# Patient Record
Sex: Male | Born: 1960 | Race: Black or African American | Hispanic: No | Marital: Married | Smoking: Never smoker
Health system: Southern US, Community
[De-identification: ages and names within clinical notes are randomized; demographics above are authoritative.]

## PROBLEM LIST (undated history)

## (undated) DIAGNOSIS — M2550 Pain in unspecified joint: Secondary | ICD-10-CM

## (undated) DIAGNOSIS — K219 Gastro-esophageal reflux disease without esophagitis: Secondary | ICD-10-CM

## (undated) DIAGNOSIS — I10 Essential (primary) hypertension: Secondary | ICD-10-CM

## (undated) DIAGNOSIS — D869 Sarcoidosis, unspecified: Secondary | ICD-10-CM

## (undated) DIAGNOSIS — E669 Obesity, unspecified: Secondary | ICD-10-CM

## (undated) DIAGNOSIS — Z86718 Personal history of other venous thrombosis and embolism: Secondary | ICD-10-CM

## (undated) DIAGNOSIS — M549 Dorsalgia, unspecified: Secondary | ICD-10-CM

## (undated) DIAGNOSIS — G4730 Sleep apnea, unspecified: Secondary | ICD-10-CM

## (undated) DIAGNOSIS — R0602 Shortness of breath: Secondary | ICD-10-CM

## (undated) DIAGNOSIS — G4700 Insomnia, unspecified: Secondary | ICD-10-CM

## (undated) DIAGNOSIS — R5383 Other fatigue: Secondary | ICD-10-CM

## (undated) DIAGNOSIS — I4891 Unspecified atrial fibrillation: Secondary | ICD-10-CM

## (undated) DIAGNOSIS — M1990 Unspecified osteoarthritis, unspecified site: Secondary | ICD-10-CM

## (undated) DIAGNOSIS — M069 Rheumatoid arthritis, unspecified: Secondary | ICD-10-CM

## (undated) DIAGNOSIS — I499 Cardiac arrhythmia, unspecified: Secondary | ICD-10-CM

## (undated) HISTORY — PX: OTHER SURGICAL HISTORY: SHX169

## (undated) HISTORY — DX: Sarcoidosis, unspecified: D86.9

## (undated) HISTORY — DX: Rheumatoid arthritis, unspecified: M06.9

## (undated) HISTORY — DX: Personal history of other venous thrombosis and embolism: Z86.718

## (undated) HISTORY — DX: Cardiac arrhythmia, unspecified: I49.9

## (undated) HISTORY — DX: Pain in unspecified joint: M25.50

## (undated) HISTORY — DX: Unspecified atrial fibrillation: I48.91

## (undated) HISTORY — DX: Shortness of breath: R06.02

## (undated) HISTORY — DX: Obesity, unspecified: E66.9

## (undated) HISTORY — DX: Other fatigue: R53.83

## (undated) HISTORY — PX: COLONOSCOPY: SHX174

## (undated) HISTORY — DX: Essential (primary) hypertension: I10

## (undated) HISTORY — DX: Dorsalgia, unspecified: M54.9

## (undated) HISTORY — PX: VASECTOMY: SHX75

---

## 1979-04-23 HISTORY — PX: WRIST SURGERY: SHX841

## 2003-03-25 ENCOUNTER — Ambulatory Visit (HOSPITAL_COMMUNITY): Admission: RE | Admit: 2003-03-25 | Discharge: 2003-03-25 | Payer: Self-pay | Admitting: Family Medicine

## 2003-04-08 ENCOUNTER — Ambulatory Visit (HOSPITAL_COMMUNITY): Admission: RE | Admit: 2003-04-08 | Discharge: 2003-04-08 | Payer: Self-pay | Admitting: Family Medicine

## 2003-04-29 ENCOUNTER — Ambulatory Visit (HOSPITAL_COMMUNITY): Admission: RE | Admit: 2003-04-29 | Discharge: 2003-04-29 | Payer: Self-pay | Admitting: Internal Medicine

## 2003-08-24 ENCOUNTER — Ambulatory Visit (HOSPITAL_COMMUNITY): Admission: RE | Admit: 2003-08-24 | Discharge: 2003-08-24 | Payer: Self-pay | Admitting: Internal Medicine

## 2003-08-24 ENCOUNTER — Encounter (INDEPENDENT_AMBULATORY_CARE_PROVIDER_SITE_OTHER): Payer: Self-pay | Admitting: *Deleted

## 2004-05-25 ENCOUNTER — Ambulatory Visit: Payer: Self-pay | Admitting: Internal Medicine

## 2005-08-26 ENCOUNTER — Ambulatory Visit: Payer: Self-pay | Admitting: Internal Medicine

## 2007-01-12 ENCOUNTER — Ambulatory Visit: Payer: Self-pay | Admitting: Internal Medicine

## 2007-01-12 LAB — CONVERTED CEMR LAB
ALT: 20 units/L (ref 0–53)
AST: 23 units/L (ref 0–37)
Albumin: 3.6 g/dL (ref 3.5–5.2)
Alkaline Phosphatase: 42 units/L (ref 39–117)
Angiotensin 1 Converting Enzyme: 87 units/L — ABNORMAL HIGH (ref 9–67)
BUN: 14 mg/dL (ref 6–23)
Basophils Absolute: 0 10*3/uL (ref 0.0–0.1)
Basophils Relative: 0.3 % (ref 0.0–1.0)
Bilirubin, Direct: 0.2 mg/dL (ref 0.0–0.3)
CO2: 35 meq/L — ABNORMAL HIGH (ref 19–32)
Calcium: 9.5 mg/dL (ref 8.4–10.5)
Chloride: 111 meq/L (ref 96–112)
Creatinine, Ser: 1.3 mg/dL (ref 0.4–1.5)
Eosinophils Absolute: 0.1 10*3/uL (ref 0.0–0.6)
Eosinophils Relative: 3 % (ref 0.0–5.0)
GFR calc Af Amer: 76 mL/min
GFR calc non Af Amer: 63 mL/min
Glucose, Bld: 103 mg/dL — ABNORMAL HIGH (ref 70–99)
HCT: 45.2 % (ref 39.0–52.0)
Hemoglobin: 14.8 g/dL (ref 13.0–17.0)
Lymphocytes Relative: 26.1 % (ref 12.0–46.0)
MCHC: 32.6 g/dL (ref 30.0–36.0)
MCV: 89.4 fL (ref 78.0–100.0)
Monocytes Absolute: 0.4 10*3/uL (ref 0.2–0.7)
Monocytes Relative: 9.7 % (ref 3.0–11.0)
Neutro Abs: 2.6 10*3/uL (ref 1.4–7.7)
Neutrophils Relative %: 60.9 % (ref 43.0–77.0)
Platelets: 174 10*3/uL (ref 150–400)
Potassium: 4.3 meq/L (ref 3.5–5.1)
RBC: 5.05 M/uL (ref 4.22–5.81)
RDW: 13.1 % (ref 11.5–14.6)
Sodium: 148 meq/L — ABNORMAL HIGH (ref 135–145)
Total Bilirubin: 1.3 mg/dL — ABNORMAL HIGH (ref 0.3–1.2)
Total Protein: 7.4 g/dL (ref 6.0–8.3)
WBC: 4.2 10*3/uL — ABNORMAL LOW (ref 4.5–10.5)

## 2008-01-07 DIAGNOSIS — D869 Sarcoidosis, unspecified: Secondary | ICD-10-CM

## 2008-03-28 ENCOUNTER — Ambulatory Visit: Payer: Self-pay | Admitting: Internal Medicine

## 2008-03-28 DIAGNOSIS — G4700 Insomnia, unspecified: Secondary | ICD-10-CM | POA: Insufficient documentation

## 2008-05-06 ENCOUNTER — Telehealth: Payer: Self-pay | Admitting: Internal Medicine

## 2009-06-20 ENCOUNTER — Ambulatory Visit: Payer: Self-pay | Admitting: Internal Medicine

## 2010-02-21 ENCOUNTER — Telehealth: Payer: Self-pay | Admitting: Internal Medicine

## 2010-05-12 ENCOUNTER — Encounter: Payer: Self-pay | Admitting: Family Medicine

## 2010-05-24 NOTE — Assessment & Plan Note (Signed)
Summary: 50yr follow up/klw   Primary Provider/Referring Provider:  Laurann Montana  CC:  follow up visit.  History of Present Illness: 01/16/07- PROBLEM:  Sarcoidosis.  HISTORY:  A 2-year followup.  He says he feels fine, but thought it was time to be rechecked.  Denies cough, rash, or night sweats.  Points to a little minor scaling on his hands of no known significance.  Also has some minor acneform rash on his chest.   03/28/08- Hx Sarcoid.  He asks about sleep. Dr Cliffton Asters told him his symptoms are not those of sleep apnea. Wife tells him he doesn't snore or display apnea. Trazodone helps fall asleep intially- up til 0200 watching tv until tired enough to sleep.  Went to derm for lotion which helped hyperpigmented lesions on skin. Denies fever, nodes, cough, chest pain. Discussed ACE level 87 last year.  June 20, 2009- Sarcoid, insomnia Has noted some rash still - meaning a few hyperpigmented spots on his trunk with no progression. He denies change in breathing, unusual dyspnea or cough, fever or night sweat, or nodes. Insomnia- Trazadone not quite enough but he likes an otc product for occasional use. We reviewed his last 2 CXR with stable Stage II adenopathy.    Current Medications (verified): 1)  Triamterene-Hctz 75-50 Mg Tabs (Triamterene-Hctz) .... Take One By Mouth Once Daily 2)  Trazodone Hcl 50 Mg Tabs (Trazodone Hcl) .... Take One By Mouth At Bedtime As Needed 3)  Fexofenadine Hcl 180 Mg Tabs (Fexofenadine Hcl) .... Take One By Mouth Once Daily 4)  Ginkoba 40 Mg Tabs (Ginkgo Biloba) .... Take 1 By Mouth Once Daily 5)  Vitamin E Natural 400 Unit Caps (Vitamin E) .... Take 2 By Mouth Once Daily 6)  Fish Oil 1000 Mg Caps (Omega-3 Fatty Acids) .... Take 1 By Mouth Once Daily  Allergies (verified): No Known Drug Allergies  Past History:  Past Medical History: Last updated: 03/28/2008 SARCOIDOSIS (ICD-135)  Family History: Last updated: 05/11/2008 Mother-living age  79 Father-deceased age 64; rectal cancer Sibling 1-living age 62 Sibling 2-living age 37 Sibling 3-living age 43 Sibling 4-living age 44  Social History: Last updated: 05/11/2008 Patient never smoked.  Positive history of passive tobacco smoke exposure.  Caffeine-1 cup daily Married with 1 child.   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  Review of Systems      See HPI       The patient complains of weight gain.  The patient denies anorexia, fever, weight loss, vision loss, decreased hearing, hoarseness, chest pain, syncope, dyspnea on exertion, peripheral edema, prolonged cough, headaches, hemoptysis, abdominal pain, and severe indigestion/heartburn.    Vital Signs:  Patient profile:   50 year old male Height:      75 inches Weight:      400.25 pounds BMI:     50.21 O2 Sat:      96 % on Room air Pulse rate:   89 / minute BP sitting:   140 / 90  (left arm) Cuff size:   regular  Vitals Entered By: Reynaldo Minium CMA (June 20, 2009 2:33 PM)  O2 Flow:  Room air CC: follow up visit Comments BP taken with regular cuff on wrist of pt.Reynaldo Minium CMA  June 20, 2009 2:34 PM    Physical Exam  Additional Exam:  General: A/Ox3; pleasant and cooperative, NAD, very big man SKIN:  hyperpigmented spots on chest c/w acne scars NODES: no lymphadenopathy HEENT: [place]/AT, EOM- WNL, Conjuctivae- clear, PERRLA, TM-WNL, Nose- clear, Throat- clear and wnl NECK: Supple w/ fair ROM, JVD- none, normal carotid impulses w/o bruits Thyroid- normal to palpation CHEST: Clear to P&A HEART: RRR, no m/g/r heard ABDOMEN: Soft and nl; nml bowel sounds; no organomegaly or masses noted WUJ:WJXB, nl pulses, no edema  NEURO: Grossly intact to observation      Impression & Recommendations:  Problem # 1:  SARCOIDOSIS (ICD-135) He is clinically stable and we  decided there is no need to recheck CXR or ACE level without active symptoms at this time.,  Problem # 2:  INSOMNIA (ICD-780.52)  OTC med is sufficient. It is likely an antihistamine.  Medications Added to Medication List This Visit: 1)  Fish Oil 1000 Mg Caps (Omega-3 fatty acids) .... Take 1 by mouth once daily  Other Orders: Est. Patient Level III (14782)  Patient Instructions: 1)  Schedule return in one year, earlier if needed. Please call as needed.

## 2010-05-24 NOTE — Progress Notes (Signed)
Summary: nos appt  Phone Note Call from Patient   Caller: [email or web address]  Call For: young Summary of Call: LMTCB x2 to rsc nos from 11/1. Initial call taken by: Darletta Moll,  February 21, 2010 3:10 PM

## 2010-06-15 ENCOUNTER — Other Ambulatory Visit: Payer: Self-pay | Admitting: Internal Medicine

## 2010-06-15 ENCOUNTER — Encounter: Payer: Self-pay | Admitting: Internal Medicine

## 2010-06-15 ENCOUNTER — Ambulatory Visit (INDEPENDENT_AMBULATORY_CARE_PROVIDER_SITE_OTHER)
Admission: RE | Admit: 2010-06-15 | Discharge: 2010-06-15 | Disposition: A | Discharge status: Home / Self Care | Payer: BC Managed Care – PPO | Source: Ambulatory Visit | Attending: Internal Medicine | Admitting: Internal Medicine

## 2010-06-15 ENCOUNTER — Ambulatory Visit (INDEPENDENT_AMBULATORY_CARE_PROVIDER_SITE_OTHER): Payer: BC Managed Care – PPO | Admitting: Internal Medicine

## 2010-06-15 DIAGNOSIS — J309 Allergic rhinitis, unspecified: Secondary | ICD-10-CM | POA: Insufficient documentation

## 2010-06-15 DIAGNOSIS — D869 Sarcoidosis, unspecified: Secondary | ICD-10-CM

## 2010-06-15 DIAGNOSIS — I10 Essential (primary) hypertension: Secondary | ICD-10-CM | POA: Insufficient documentation

## 2010-06-28 NOTE — Assessment & Plan Note (Signed)
Summary: 1 YEAR ROV//SH   Primary Provider/Referring Provider:  Laurann Montana  CC:  Yearly follow up visit-Insomnia and sarcoidosis.  History of Present Illness: 03/28/08- Hx Sarcoid.  He asks about sleep. Dr Cliffton Asters told him his symptoms are not those of sleep apnea. Wife tells him he doesn't snore or display apnea. Trazodone helps fall asleep intially- up til 0200 watching tv until tired enough to sleep.  Went to derm for lotion which helped hyperpigmented lesions on skin. Denies fever, nodes, cough, chest pain. Discussed ACE level 87 last year.  June 20, 2009- Sarcoid, insomnia Has noted some rash still - meaning a few hyperpigmented spots on his trunk with no progression. He denies change in breathing, unusual dyspnea or cough, fever or night sweat, or nodes. Insomnia- Trazadone not quite enough but he likes an otc product for occasional use. We reviewed his last 2 CXR with stable Stage II adenopathy.   June 15, 2010-  Sarcoid, insomnia Nurse-CC: Yearly follow up visit-Insomnia and sarcoidosis CXR 2009 showed mild CR and chronic hilar enlargement.  Concerned about skin rash-? sarcoid. Chest ok. Gets shoulder pains affected by position he lies in- not exertional. Denies anginal pain, palpitation, cough, wheeze, nodes, night sweats or fevers.     Preventive Screening-Counseling & Management  Alcohol-Tobacco     Smoking Status: never     Passive Smoke Exposure: yes  Current Medications (verified): 1)  Triamterene-Hctz 75-50 Mg Tabs (Triamterene-Hctz) .... Take 1/2 By Mouth Once Daily 2)  Lunesta 3 Mg Tabs (Eszopiclone) .... Take 1 By Mouth At Bedtime As Needed 3)  Fexofenadine Hcl 180 Mg Tabs (Fexofenadine Hcl) .... Take One By Mouth Once Daily 4)  Ginkoba 40 Mg Tabs (Ginkgo Biloba) .... Take 1 By Mouth Once Daily 5)  Vitamin E Natural 400 Unit Caps (Vitamin E) .... Take 2 By Mouth Once Daily 6)  Fish Oil 1000 Mg Caps (Omega-3 Fatty Acids) .... Take 1 By Mouth Once  Daily 7)  Norvasc 10 Mg Tabs (Amlodipine Besylate) .... 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  Family History: Last updated: 05/11/2008 Mother-living age 15 Father-deceased age 87; rectal cancer Sibling 1-living age 88 Sibling 2-living age 40 Sibling 3-living age 69 Sibling 4-living age 36  Social History: Last updated: 05/11/2008 Patient never smoked.  Positive history of passive tobacco smoke exposure.  Caffeine-1 cup daily Married with 1 child.   Risk Factors: Smoking Status: never (06/15/2010) Passive Smoke Exposure: yes (06/15/2010)  Past Medical History: SARCOIDOSIS (ICD-135) Allergic Rhinitis Hypertension  Review of Systems      See HPI       The patient complains of shortness of breath with activity.  The patient denies shortness of breath at rest, productive cough, non-productive cough, coughing up blood, chest pain, irregular heartbeats, acid heartburn, indigestion, loss of appetite, weight change, abdominal pain, difficulty swallowing, sore throat, tooth/dental problems, headaches, nasal congestion/difficulty breathing through nose, and sneezing.    Vital Signs:  Patient profile:   49 year old male Height:      75 inches Weight:      403.13 pounds BMI:     50.57 O2 Sat:      97 % on Room air Pulse rate:   91 / minute BP sitting:   120 / 80  (left arm) Cuff size:   large  Vitals Entered By:  Reynaldo Minium CMA (June 15, 2010 1:40 PM)  O2 Flow:  Room air CC: Yearly follow up visit-Insomnia and sarcoidosis   Physical Exam  Additional Exam:  General: A/Ox3; pleasant and cooperative, NAD, very big man, obese SKIN: hyperpigmented spots on chest c/w acne scars. Dark raised multinodular patch or left pretibial area.  NODES: no lymphadenopathy HEENT: [place]/AT, EOM- WNL, Conjuctivae- clear,  PERRLA, TM-WNL, Nose- clear, Throat- clear and wnl NECK: Supple w/ fair ROM, JVD- none, normal carotid impulses w/o bruits Thyroid- normal to palpation CHEST: Clear to P&A HEART: RRR, no m/g/r heard ABDOMEN: obese BJY:NWGN, nl pulses, no edema  NEURO: Grossly intact to observation      Impression & Recommendations:  Problem # 1:  SARCOIDOSIS (ICD-135) I don't see indication that systemic sarcoid has returned.  skin rash on trunk looks like foliculitis. The area on his left leg is something else and I will send him to dermatologist. He asks referral suggestions, but wants to discuss with his wife.   Medications Added to Medication List This Visit: 1)  Triamterene-hctz 75-50 Mg Tabs (Triamterene-hctz) .... Take 1/2 by mouth once daily 2)  Lunesta 3 Mg Tabs (Eszopiclone) .... Take 1 by mouth at bedtime as needed 3)  Norvasc 10 Mg Tabs (Amlodipine besylate) .... Take 1 by mouth once daily  Other Orders: Est. Patient Level III (56213) T-2 View CXR (71020TC)  Patient Instructions: 1)  Please schedule a follow-up appointment in 1 year. 2)  A chest x-ray has been recommended.  Your imaging study may require preauthorization.  3)  I suggest [facility] on [address]  4)  (Dr Romeo [facility])

## 2010-09-04 NOTE — Assessment & Plan Note (Signed)
[facility]                             PULMONARY OFFICE NOTE   Brent Mccoy, Brent Mccoy                    MRN:          [id number]  DATE:01/12/2007                            DOB:          11-Feb-1961    PULMONARY FOLLOWUP   PROBLEM:  Sarcoidosis.   HISTORY:  A 2-year followup.  He says he feels fine, but thought it was  time to be rechecked.  Denies cough, rash, or night sweats.  Points to a  little minor scaling on his hands of no known significance.  Also has  some minor acneform rash on his chest.   MEDICATIONS:  1. Triamterine/hydrochlorothiazide 75/50.  2. P.r.n. use of Astelin.  3. Temazepam.  4. Trazodone 50 mg p.r.n. for sleep.  5. Fexofenadine 180 mg.   No medication allergy.   OBJECTIVE:  Weight 386 pounds compared with 358 pounds in 2007.  He is  tall and very heavy.  BP 132/90, pulse 92, room air saturation 98%.  Good spirits.  Conjunctivae clear.  Nasal airway not obstructed.  Lungs sound very clear.  HEART:  Sounds are regular without murmur.  I do not find adenopathy.  Abdomen is obese.  I cannot feel liver or  spleen.   LABORATORY:  Angiotensin converting enzyme elevated at 87 (9-67).  Chest  x-ray suggests mild hilar prominence with no parenchymal abnormality.  Basically a normal film.  CBC showed a white blood cell count of 4,200.  Hemoglobin 14.8.  Chemistries showed a sodium 148, resting carbon  dioxide of 35, glucose 103, BUN 14, creatinine 1.3, calcium 9.5.  Liver  enzymes normal.   IMPRESSION:  Clinically stable sarcoidosis.  Substantial exogenous  obesity.   PLAN:  Labs were ordered and returned in time for dictation, as noted  above.  Results were shared.  Schedule return in 1 year, earlier p.r.n.     Clinton D. Maple Hudson, MD, Tonny Bollman, FACP  Electronically Signed   CDY/MedQ  DD: 01/16/2007  DT: 01/17/2007  Job #: [id number]   cc:   Stacie Acres. Cliffton Asters, M.D.

## 2010-09-07 NOTE — Op Note (Signed)
NAME:  Brent Mccoy, Brent Mccoy                       ACCOUNT NO.:  [id number]   MEDICAL RECORD NO.:  [id number]                   PATIENT TYPE:  AMB   LOCATION:  ENDO                                 FACILITY:  [facility]   PHYSICIAN:  Clinton D. Maple Hudson, M.D.              DATE OF BIRTH:  04-Dec-1960   DATE OF PROCEDURE:  08/24/2003  DATE OF DISCHARGE:                                 OPERATIVE REPORT   PROCEDURE PERFORMED:  Bronchoscopy.   ENDOSCOPIST:  Rennis Chris. Maple Hudson, M.D.   INDICATIONS FOR PROCEDURE:  The patient is a 50 year old African-American  male nonsmoker with infiltrate particularly in the left upper and mid lung  zone, mediastinal and hilar adenopathy, negative PPD, elevation angiotensin  converting enzyme and restricted pulmonary function tests.  Bronchoscopy is  performed for tissue confirmation of suspected sarcoid.   PREOPERATIVE EVALUATION:  Blood pressure 136/89, weight 350 pounds.  Heart  sounds regular without murmur or gallop.  No peripheral adenopathy, no  cyanosis, clubbing, edema or neck vein distention, quiet, clear breath  sounds, no crackles or wheeze heard.   ALLERGIES:  No medication allergies.   CURRENT MEDICATIONS:  Claritin, Rhinocort, Triamterene/HCTZ, prednisone  begun at 40 mg daily on April 15 and now at 30 mg daily.   DESCRIPTION OF PROCEDURE:  After fully informed consent, bronchoscopy was  performed on an outpatient basis in the endoscopy suite.  Premedication was  with Demerol and atropine.  The upper airway was anesthetized topically with  Cetacaine and Xylocaine.  Oxygen was provided at 10L per minute by nasal  prongs holding saturations at 100%.  Cardiac monitor showed normal sinus  rhythm.  He was given a cumulative dose of 10 mg of intravenous Versed and  25 mg of intravenous Demerol to produce very light sedation for cough  suppression.  A video bronchoscope was introduced via the right nostril and  advanced to the level of the vocal cords  without difficulty.  Cough was  moderate, the cords  moved normally.  The trachea and main carina were  unremarkable.  The carina may have been very slightly wide. There was a mild  generalized bronchitis.  Secretions were clear and white.  With fluoroscopic  guidance, the bronchoscope was directed to the left upper lobe.  The left  upper lobe and lingula were lavaged with saline, brushed for cytology and  AFB smear and then biopsied by standard transbronchial lung biopsy  technique.  There was no apparent complication with no evidence of  pneumothorax or bleeding during the procedure.  He appeared stable and was  lucid and conversant immediately upon ending the procedure.   FINAL IMPRESSION:  Probable sarcoid.  He will be held until stable and then  released home with family to office follow-up.  Clinton D. Maple Hudson, M.D.    CDY/MEDQ  D:  08/24/2003  T:  08/24/2003  Job:  [id number]   cc:   Stacie Acres. White, M.D.  [address]  [place]  [place] [address]  Fax: [phone]

## 2011-03-13 ENCOUNTER — Encounter: Payer: Self-pay | Admitting: Internal Medicine

## 2011-03-15 ENCOUNTER — Ambulatory Visit: Payer: BC Managed Care – PPO | Admitting: Internal Medicine

## 2011-04-11 ENCOUNTER — Encounter: Payer: Self-pay | Admitting: Internal Medicine

## 2011-04-11 ENCOUNTER — Ambulatory Visit (INDEPENDENT_AMBULATORY_CARE_PROVIDER_SITE_OTHER): Payer: 59 | Admitting: Internal Medicine

## 2011-04-11 VITALS — BP 130/72 | HR 99 | Ht 75.0 in | Wt 398.4 lb

## 2011-04-11 DIAGNOSIS — D869 Sarcoidosis, unspecified: Secondary | ICD-10-CM

## 2011-04-11 DIAGNOSIS — G4700 Insomnia, unspecified: Secondary | ICD-10-CM

## 2011-04-11 NOTE — Patient Instructions (Signed)
Please call as needed 

## 2011-04-11 NOTE — Progress Notes (Signed)
04/11/11- 50 yom never smoker followed for hx sarcoid, insomnia complicated by HBP, allergic rhinitis. LOV-06/15/10  Feels fine. Declines flu vax. Saw dermatology for rash "not sarcoid". Denies chest symptoms.  Insomnia- failed [place]. Prefers trazodone. CXR 06/15/10- stable hilar fullness, shallow inspiration. Images reviewed w/ him.  ROV-see HPI Constitutional:   No-   weight loss, night sweats, fevers, chills, fatigue, lassitude. HEENT:   No-  headaches, difficulty swallowing, tooth/dental problems, sore throat,       No-  sneezing, itching, ear ache, nasal congestion, post nasal drip,  CV:  No-   chest pain, orthopnea, PND, swelling in lower extremities, anasarca, dizziness, palpitations Resp: No-   shortness of breath with exertion or at rest.              No-   productive cough,  No non-productive cough,  No- coughing up of blood.              No-   change in color of mucus.  No- wheezing.   Skin: No-   rash or lesions. GI:  No-   heartburn, indigestion, abdominal pain, nausea, vomiting, diarrhea,                 change in bowel habits, loss of appetite GU: No-   dysuria, change in color of urine, no urgency or frequency.  No- flank pain. MS:  No-   joint pain or swelling.  No- decreased range of motion.  No- back pain. Neuro-     nothing unusual Psych:  No- change in mood or affect. No depression or anxiety.  No memory loss.  OBJ General- Alert, Oriented, Affect-appropriate, Distress- none acute, morbidly obese, laconic Skin- rash-hyperpigmented area persists left lateral calf. , lesions- none, excoriation- none Lymphadenopathy- none Head- atraumatic            Eyes- Gross vision intact, PERRLA, conjunctivae clear secretions            Ears- Hearing, canals-normal            Nose- Clear, no-Septal dev, mucus, polyps, erosion, perforation             Throat- Mallampati II , mucosa clear , drainage- none, tonsils- atrophic Neck- flexible , trachea midline, no stridor ,  thyroid nl, carotid no bruit Chest - symmetrical excursion , unlabored           Heart/CV- RRR , no murmur , no gallop  , no rub, nl s1 s2                           - JVD- none , edema- none, stasis changes- none, varices- none           Lung- clear to P&A, wheeze- none, cough- none , dullness-none, rub- none           Chest wall-  Abd- tender-no, distended-no, bowel sounds-present, HSM- no Br/ Gen/ Rectal- Not done, not indicated Extrem- cyanosis- none, clubbing, none, atrophy- none, strength- nl Neuro- grossly intact to observation

## 2011-04-14 NOTE — Assessment & Plan Note (Signed)
Chronic non-specific insomnia. He asks and is given permission to try Zzquil. Sleep hygiene reviewed.

## 2011-04-14 NOTE — Assessment & Plan Note (Signed)
Sarcoid is not clinically active and may be burned out. I suggested he f/u with his PCP as needed. I will be happy to see him again prn.

## 2013-04-30 ENCOUNTER — Ambulatory Visit
Admission: RE | Admit: 2013-04-30 | Discharge: 2013-04-30 | Disposition: A | Discharge status: Home / Self Care | Payer: 59 | Source: Ambulatory Visit | Attending: Family Medicine | Admitting: Family Medicine

## 2013-04-30 ENCOUNTER — Other Ambulatory Visit: Payer: Self-pay | Admitting: Family Medicine

## 2013-04-30 DIAGNOSIS — M25552 Pain in left hip: Secondary | ICD-10-CM

## 2013-06-19 ENCOUNTER — Encounter: Payer: Self-pay | Admitting: *Deleted

## 2014-02-06 ENCOUNTER — Ambulatory Visit (INDEPENDENT_AMBULATORY_CARE_PROVIDER_SITE_OTHER): Payer: 59 | Admitting: Physician Assistant

## 2014-02-06 VITALS — BP 130/80 | HR 109 | Temp 98.8°F | Resp 16 | Wt >= 6400 oz

## 2014-02-06 DIAGNOSIS — M25569 Pain in unspecified knee: Secondary | ICD-10-CM

## 2014-02-06 DIAGNOSIS — M25559 Pain in unspecified hip: Secondary | ICD-10-CM

## 2014-02-06 DIAGNOSIS — J019 Acute sinusitis, unspecified: Secondary | ICD-10-CM

## 2014-02-06 DIAGNOSIS — R059 Cough, unspecified: Secondary | ICD-10-CM

## 2014-02-06 DIAGNOSIS — R05 Cough: Secondary | ICD-10-CM

## 2014-02-06 MED ORDER — IPRATROPIUM BROMIDE 0.03 % NA SOLN: 2.0000 | Freq: Two times a day (BID) | NASAL | Status: DC

## 2014-02-06 MED ORDER — PROMETHAZINE-CODEINE 6.25-10 MG/5ML PO SYRP: 5.0000 mL | ORAL_SOLUTION | Freq: Four times a day (QID) | ORAL | Status: DC | PRN

## 2014-02-06 MED ORDER — MELOXICAM 15 MG PO TABS: 15.0000 mg | ORAL_TABLET | Freq: Every day | ORAL | Status: DC

## 2014-02-06 NOTE — Progress Notes (Signed)
Subjective:    Patient ID: Brent Mccoy, male    DOB: Dec 01, 1960, 53 y.o.   MRN: [id number]  HPI  Brent Mccoy is a 53 y.o. male with a pmh of Sarcoidosis currently in remission presenting for 2 day history very "violent cough". Cough started when he was out Friday night, is worse at night, productive with clear sputum, no hemoptysis. Associated symptoms include chills, sinus pressure, congestion, throat has felt raw from coughing in the following days. Patient had promethazine-codeine syrup left over from a previous visit in 2013 for similar complaint, states that it helped calm his cough, throat pain last night. He also tried going to the sauna at his gym yesterday which helped clear things up in his sinuses but his cough has persisted. Also states that he has tried Mucinex in the past without relief. Denies fevers, headaches, tooth pain, eye pain, ear pain, sob, chest pain, chest tightness, n/v, diarrhea, abdominal pain. He is worried that he has an infection but would also like to know if this is a cold or just allergies. Denies any other aggravating or relieving factors.  Of note, patient sees an orthopedist for knee and hip pain. Reports that he is in the process of managing his pain until he can get a knee replacement. Currently, takes Percocet for pain. States that since he used the cough syrup, he is not using the Percocet. In the meantime, he would like to know what he can do for his knee/hip pain. Denies other questions or concerns.   Prior to Admission medications   Medication Sig Start Date End Date Taking? Authorizing Provider  amLODipine (NORVASC) 10 MG tablet Take 10 mg by mouth daily.     Yes Historical Provider, MD  Ginkgo Biloba (GINKOBA) 40 MG TABS Take 1 tablet by mouth daily.     Yes Historical Provider, MD  MELOXICAM PO Take by mouth.   Yes Historical Provider, MD  Omega-3 Fatty Acids (FISH OIL) 1000 MG CAPS Take 1 capsule by mouth daily.     Yes Historical Provider, MD    triamterene-hydrochlorothiazide (MAXZIDE) 75-50 MG per tablet Take 1 tablet by mouth daily.     Yes Historical Provider, MD  vitamin E (VITAMIN E) 400 UNIT capsule Take 400 Units by mouth daily.     Yes Historical Provider, MD  zolpidem (AMBIEN) 10 MG tablet Take 10 mg by mouth at bedtime as needed for sleep.   Yes Historical Provider, MD  cyclobenzaprine (FLEXERIL) 10 MG tablet Take 10 mg by mouth 3 (three) times daily as needed for muscle spasms.    Historical Provider, MD  Eszopiclone 3 MG TABS Take 3 mg by mouth at bedtime. Take immediately before bedtime     Historical Provider, MD    No Known Allergies   Review of Systems As in subjective.    Objective:   Physical Exam  Vitals reviewed. Constitutional: He appears well-developed and well-nourished. No distress.  HENT:  Right Ear: External ear normal.  Left Ear: External ear normal.  Eyes: Conjunctivae are normal. Pupils are equal, round, and reactive to light. Right eye exhibits no discharge. Left eye exhibits no discharge. No scleral icterus.  Neck: Normal range of motion. Neck supple. No thyromegaly present.  Cardiovascular: Normal rate, regular rhythm, normal heart sounds and intact distal pulses.  Exam reveals no gallop and no friction rub.   No murmur heard. Pulmonary/Chest: Effort normal and breath sounds normal. No stridor. No respiratory distress. He has no wheezes. He  has no rales.  Lymphadenopathy:    He has no cervical adenopathy.  Skin: He is not diaphoretic.      Assessment & Plan:    1. Acute rhinosinusitis 2. Cough Likely viral in etiology, supportive care advised, return to clinic if symptoms worsen, fail to resolve or as needed - ipratropium (ATROVENT) 0.03 % nasal spray; Place 2 sprays into both nostrils 2 (two) times daily.  Dispense: 30 mL; Refill: 0 - promethazine-codeine (PHENERGAN WITH CODEINE) 6.25-10 MG/5ML syrup; Take 5 mLs by mouth every 6 (six) hours as needed for cough.  Dispense: 120 mL;  Refill: 0  3. Hip pain, unspecified laterality 4. Knee pain, unspecified laterality Advised patient not to take codeine syrup concurrently with syrup, patient verbalized agreement, Rx Mobic for hip and knee pain, continue follow up with Ortho - meloxicam (MOBIC) 15 MG tablet; Take 1 tablet (15 mg total) by mouth daily.  Dispense: 30 tablet; Refill: [place], PA-C [facility] [phone] 02/06/2014 1:26 PM

## 2014-02-07 NOTE — Progress Notes (Signed)
I have discussed this patient with Mr. Bess Harvest, [place] and agree.

## 2015-01-11 ENCOUNTER — Other Ambulatory Visit: Payer: Self-pay | Admitting: Orthopedic Surgery

## 2015-02-09 ENCOUNTER — Ambulatory Visit (HOSPITAL_COMMUNITY)
Admission: RE | Admit: 2015-02-09 | Discharge: 2015-02-09 | Disposition: A | Discharge status: Home / Self Care | Payer: 59 | Source: Ambulatory Visit | Attending: Orthopedic Surgery | Admitting: Orthopedic Surgery

## 2015-02-09 ENCOUNTER — Encounter (HOSPITAL_COMMUNITY): Payer: Self-pay

## 2015-02-09 ENCOUNTER — Encounter (HOSPITAL_COMMUNITY)
Admission: RE | Admit: 2015-02-09 | Discharge: 2015-02-09 | Disposition: A | Discharge status: Home / Self Care | Payer: 59 | Source: Ambulatory Visit | Attending: Orthopedic Surgery | Admitting: Orthopedic Surgery

## 2015-02-09 DIAGNOSIS — Z0183 Encounter for blood typing: Secondary | ICD-10-CM | POA: Diagnosis not present

## 2015-02-09 DIAGNOSIS — M1612 Unilateral primary osteoarthritis, left hip: Secondary | ICD-10-CM | POA: Diagnosis not present

## 2015-02-09 DIAGNOSIS — Z01818 Encounter for other preprocedural examination: Secondary | ICD-10-CM | POA: Insufficient documentation

## 2015-02-09 DIAGNOSIS — Z01812 Encounter for preprocedural laboratory examination: Secondary | ICD-10-CM | POA: Insufficient documentation

## 2015-02-09 DIAGNOSIS — I10 Essential (primary) hypertension: Secondary | ICD-10-CM | POA: Diagnosis not present

## 2015-02-09 HISTORY — DX: Sleep apnea, unspecified: G47.30

## 2015-02-09 HISTORY — DX: Insomnia, unspecified: G47.00

## 2015-02-09 HISTORY — DX: Gastro-esophageal reflux disease without esophagitis: K21.9

## 2015-02-09 HISTORY — DX: Unspecified osteoarthritis, unspecified site: M19.90

## 2015-02-09 LAB — CBC WITH DIFFERENTIAL/PLATELET
BASOS PCT: 0 %
Basophils Absolute: 0 10*3/uL (ref 0.0–0.1)
Eosinophils Absolute: 0.1 10*3/uL (ref 0.0–0.7)
Eosinophils Relative: 3 %
HEMATOCRIT: 45.2 % (ref 39.0–52.0)
HEMOGLOBIN: 15.2 g/dL (ref 13.0–17.0)
Lymphocytes Relative: 39 %
Lymphs Abs: 1.7 10*3/uL (ref 0.7–4.0)
MCH: 30 pg (ref 26.0–34.0)
MCHC: 33.6 g/dL (ref 30.0–36.0)
MCV: 89.3 fL (ref 78.0–100.0)
MONOS PCT: 7 %
Monocytes Absolute: 0.3 10*3/uL (ref 0.1–1.0)
NEUTROS ABS: 2.2 10*3/uL (ref 1.7–7.7)
NEUTROS PCT: 51 %
Platelets: 168 10*3/uL (ref 150–400)
RBC: 5.06 MIL/uL (ref 4.22–5.81)
RDW: 12.9 % (ref 11.5–15.5)
WBC: 4.4 10*3/uL (ref 4.0–10.5)

## 2015-02-09 LAB — URINALYSIS, ROUTINE W REFLEX MICROSCOPIC
Bilirubin Urine: NEGATIVE
GLUCOSE, UA: NEGATIVE mg/dL
HGB URINE DIPSTICK: NEGATIVE
KETONES UR: NEGATIVE mg/dL
LEUKOCYTES UA: NEGATIVE
Nitrite: NEGATIVE
PH: 6 (ref 5.0–8.0)
PROTEIN: NEGATIVE mg/dL
Specific Gravity, Urine: 1.019 (ref 1.005–1.030)
Urobilinogen, UA: 1 mg/dL (ref 0.0–1.0)

## 2015-02-09 LAB — SURGICAL PCR SCREEN
MRSA, PCR: NEGATIVE
Staphylococcus aureus: NEGATIVE

## 2015-02-09 LAB — BASIC METABOLIC PANEL
ANION GAP: 7 (ref 5–15)
BUN: 17 mg/dL (ref 6–20)
CALCIUM: 9.5 mg/dL (ref 8.9–10.3)
CHLORIDE: 104 mmol/L (ref 101–111)
CO2: 28 mmol/L (ref 22–32)
Creatinine, Ser: 1.17 mg/dL (ref 0.61–1.24)
GFR calc non Af Amer: >60 mL/min (ref >60)
Glucose, Bld: 97 mg/dL (ref 65–99)
Potassium: 3.9 mmol/L (ref 3.5–5.1)
SODIUM: 139 mmol/L (ref 135–145)

## 2015-02-09 LAB — PROTIME-INR
INR: 1.04 (ref 0.00–1.49)
PROTHROMBIN TIME: 13.8 s (ref 11.6–15.2)

## 2015-02-09 LAB — TYPE AND SCREEN
ABO/RH(D): B POS
ANTIBODY SCREEN: NEGATIVE

## 2015-02-09 LAB — APTT: aPTT: 28 seconds (ref 24–37)

## 2015-02-09 LAB — ABO/RH: ABO/RH(D): B POS

## 2015-02-09 NOTE — Pre-Procedure Instructions (Addendum)
    Brent Mccoy  02/09/2015     Your procedure is scheduled on Monday, October 31.  Report to [facility] Admitting at 8:15 A.M.                Your surgery is scheduled at 10:15 a.m.   Call this number if you have problems the morning of surgery:[phone]               For any other questions, please call [phone], Monday - Friday 8 AM - 4 PM.    Remember:  Do not eat food or drink liquids after midnight SUNDAY, OCTOBER 30.  Take these medicines the morning of surgery with A SIP OF WATER : amLODipine (NORVASC).                 Take if needed: loratadine (CLARITIN), HYDROcodone-acetaminophen ([place]).                             Monday, October 24, STOP taking all vitamins, Herbal Products, NSAID's ( Meloxicam, Aspirin, Aleve, Ibuprofen).    Do not wear jewelry, make-up or nail polish.   Do not wear lotions, powders, or perfumes.     Do not shave 48 hours prior to surgery.    Do not bring valuables to the hospital.   [facility] is not responsible for any belongings or valuables.  Contacts, dentures or bridgework may not be worn into surgery.  Leave your suitcase in the car.  After surgery it may be brought to your room.  For patients admitted to the hospital, discharge time will be determined by your treatment team.  Special instructions:  Review  [place] - Preparing For Surgery.                Bring CPAP Mask  Please read over the following fact sheets that you were given. Pain Booklet, Coughing and Deep Breathing, Blood Transfusion Information and Surgical Site Infection Prevention

## 2015-02-09 NOTE — Pre-Procedure Instructions (Signed)
Brent Mccoy  02/09/2015      [id number] [name], [place] - [name] [name] [address] Phone: [phone] Fax: [phone]    Your procedure is scheduled on Mon, Oct 31 @ 10:15 AM  Report to [facility] at 8:15 AM  Call this number if you have problems the morning of surgery:  [phone]   Remember:  Do not eat food or drink liquids after midnight.  Take these medicines the morning of surgery with A SIP OF WATER Amlodipine(Norvasc),Pain Pill(if needed),Atrovent, and Claritin(Loratadine)               Stop taking your Fish Oil,Vit E,and Meloxicam. No Goody's,BC's,Aleve,Aspirin,or any Herbal Medications.    Do not wear jewelry.  Do not wear lotions, powders, or colognes.  You may wear deodorant.             Men may shave face and neck.  Do not bring valuables to the hospital.  [facility] is not responsible for any belongings or valuables.  Contacts, dentures or bridgework may not be worn into surgery.  Leave your suitcase in the car.  After surgery it may be brought to your room.  For patients admitted to the hospital, discharge time will be determined by your treatment team.  Patients discharged the day of surgery will not be allowed to drive home.    Special instructions:  [place] - Preparing for Surgery  Before surgery, you can play an important role.  Because skin is not sterile, your skin needs to be as free of germs as possible.  You can reduce the number of germs on you skin by washing with CHG (chlorahexidine gluconate) soap before surgery.  CHG is an antiseptic cleaner which kills germs and bonds with the skin to continue killing germs even after washing.  Please DO NOT use if you have an allergy to CHG or antibacterial soaps.  If your skin becomes reddened/irritated stop using the CHG and inform your nurse when you arrive at Short Stay.  Do not shave (including legs and underarms) for at  least 48 hours prior to the first CHG shower.  You may shave your face.  Please follow these instructions carefully:   1.  Shower with CHG Soap the night before surgery and the                                morning of Surgery.  2.  If you choose to wash your hair, wash your hair first as usual with your       normal shampoo.  3.  After you shampoo, rinse your hair and body thoroughly to remove the                      Shampoo.  4.  Use CHG as you would any other liquid soap.  You can apply chg directly       to the skin and wash gently with scrungie or a clean washcloth.  5.  Apply the CHG Soap to your body ONLY FROM THE NECK DOWN.        Do not use on open wounds or open sores.  Avoid contact with your eyes,       ears, mouth and genitals (private parts).  Wash genitals (private parts)       with your normal soap.  6.  Wash thoroughly, paying special attention to the area where your surgery        will be performed.  7.  Thoroughly rinse your body with warm water from the neck down.  8.  DO NOT shower/wash with your normal soap after using and rinsing off       the CHG Soap.  9.  Pat yourself dry with a clean towel.            10.  Wear clean pajamas.            11.  Place clean sheets on your bed the night of your first shower and do not        sleep with pets.  Day of Surgery  Do not apply any lotions/deoderants the morning of surgery.  Please wear clean clothes to the hospital/surgery center.    Please read over the following fact sheets that you were given. Pain Booklet, Coughing and Deep Breathing, Blood Transfusion Information, MRSA Information and Surgical Site Infection Prevention

## 2015-02-09 NOTE — Progress Notes (Addendum)
Mr [place] PCP is Dr Harlan Stains with [facility].  Patient denies chest pain, shortness of breath.

## 2015-02-16 NOTE — H&P (Signed)
TOTAL HIP ADMISSION H&P  Patient is admitted for left total hip arthroplasty.  Subjective:  Chief Complaint: left hip pain  HPI: Brent Mccoy, 54 y.o. male, has a history of pain and functional disability in the left hip(s) due to arthritis and patient has failed non-surgical conservative treatments for greater than 12 weeks to include NSAID's and/or analgesics, corticosteriod injections, flexibility and strengthening excercises, use of assistive devices, weight reduction as appropriate and activity modification.  Onset of symptoms was gradual starting several years ago with gradually worsening course since that time.The patient noted no past surgery on the left hip(s).  Patient currently rates pain in the left hip at 10 out of 10 with activity. Patient has night pain, worsening of pain with activity and weight bearing, trendelenberg gait, pain that interfers with activities of daily living and pain with passive range of motion. Patient has evidence of subchondral cysts and joint space narrowing by imaging studies. This condition presents safety issues increasing the risk of falls.   There is no current active infection.  Patient Active Problem List   Diagnosis Date Noted  . HYPERTENSION 06/15/2010  . ALLERGIC RHINITIS 06/15/2010  . INSOMNIA 03/28/2008  . Sarcoidosis ([place]) 01/07/2008   Past Medical History  Diagnosis Date  . Sarcoidosis ([place])   . Allergic rhinitis   . Insomnia     takes Ambien nightly as needed  . Hypertension     takes Losartan and Amlodipine daily  . GERD (gastroesophageal reflux disease)     hx of - not current  . DJD (degenerative joint disease)   . Sleep apnea     Past Surgical History  Procedure Laterality Date  . Vasectomy    . 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 Colonoscopy      No prescriptions prior to admission   No Known Allergies  Social History  Substance Use Topics  . Smoking status: Never Smoker   . Smokeless tobacco:  Not on file  . Alcohol Use: No    Family History  Problem Relation Age of Onset  . Rectal cancer Father      Review of Systems  Constitutional: Negative.   HENT:       Sinus problems  Eyes: Negative.   Respiratory: Negative.   Cardiovascular:       HTN  Gastrointestinal: Positive for heartburn.  Genitourinary: Negative.   Musculoskeletal: Positive for joint pain.  Skin: Negative.   Neurological:       Memory loss  Endo/Heme/Allergies: Negative.   Psychiatric/Behavioral: The patient has insomnia.     Objective:  Physical Exam  Constitutional: He is oriented to person, place, and time. He appears well-developed and well-nourished.  Obese  HENT:  Head: Normocephalic and atraumatic.  Eyes: Pupils are equal, round, and reactive to light.  Neck: Normal range of motion. Neck supple.  Cardiovascular: Intact distal pulses.   Respiratory: Effort normal.  Musculoskeletal: He exhibits tenderness.  The patient has exquisite pain with internal or external rotation of the left hip he walks with a Trendelenburg  gait on the left.  Foot tap is negative.  Normal sensation of the foot.  Toes are pink and well-perfused. He is neurovascularly intact.  Neurological: He is alert and oriented to person, place, and time.  Skin: Skin is warm and dry.  Psychiatric: He has a normal mood and affect. His behavior is normal. Judgment and thought content normal.    Vital signs in last 24 hours:  Labs:   Estimated body mass index is 51.62 kg/(m^2) as calculated from the following:   Height as of 04/11/11: 6\' 3"  (1.905 m).   Weight as of 02/06/14: 187.336 kg (413 lb).   Imaging Review Plain radiographs demonstrate AP pelvis and crosstable lateral left hip shows now bone-on-bone there is no arthritis and there is some erosion of the femoral head.  He does have subchondral cysts but his body habitus makes finding them difficult.  Assessment/Plan:  End stage arthritis, left hip(s)  The  patient history, physical examination, clinical judgement of the provider and imaging studies are consistent with end stage degenerative joint disease of the left hip(s) and total hip arthroplasty is deemed medically necessary. The treatment options including medical management, injection therapy, arthroscopy and arthroplasty were discussed at length. The risks and benefits of total hip arthroplasty were presented and reviewed. The risks due to aseptic loosening, infection, stiffness, dislocation/subluxation,  thromboembolic complications and other imponderables were discussed.  The patient acknowledged the explanation, agreed to proceed with the plan and consent was signed. Patient is being admitted for inpatient treatment for surgery, pain control, PT, OT, prophylactic antibiotics, VTE prophylaxis, progressive ambulation and ADL's and discharge planning.The patient is planning to be discharged home with home health services

## 2015-02-17 MED ORDER — CHLORHEXIDINE GLUCONATE 4 % EX LIQD: 60.0000 mL | Freq: Once | CUTANEOUS | Status: DC

## 2015-02-17 MED ORDER — DEXTROSE-NACL 5-0.45 % IV SOLN: INTRAVENOUS | Status: DC

## 2015-02-17 MED ORDER — DEXTROSE 5 % IV SOLN
3.0000 g | INTRAVENOUS | Status: AC
  Administered 2015-02-20: 3 g via INTRAVENOUS
  Filled 2015-02-17: qty 3000

## 2015-02-20 ENCOUNTER — Encounter (HOSPITAL_COMMUNITY)
Admission: AD | Disposition: A | Discharge status: Home Health | Payer: Self-pay | Source: Ambulatory Visit | Attending: Orthopedic Surgery

## 2015-02-20 ENCOUNTER — Inpatient Hospital Stay (HOSPITAL_COMMUNITY): Payer: 59

## 2015-02-20 ENCOUNTER — Inpatient Hospital Stay (HOSPITAL_COMMUNITY)
Admission: AD | Admit: 2015-02-20 | Discharge: 2015-02-22 | DRG: 470 | Disposition: A | Discharge status: Home Health | Payer: 59 | Source: Ambulatory Visit | Attending: Orthopedic Surgery | Admitting: Orthopedic Surgery

## 2015-02-20 ENCOUNTER — Encounter (HOSPITAL_COMMUNITY): Payer: Self-pay | Admitting: Anesthesiology

## 2015-02-20 ENCOUNTER — Inpatient Hospital Stay (HOSPITAL_COMMUNITY): Payer: 59 | Admitting: Anesthesiology

## 2015-02-20 DIAGNOSIS — G4700 Insomnia, unspecified: Secondary | ICD-10-CM | POA: Diagnosis present

## 2015-02-20 DIAGNOSIS — M25552 Pain in left hip: Secondary | ICD-10-CM | POA: Diagnosis present

## 2015-02-20 DIAGNOSIS — J309 Allergic rhinitis, unspecified: Secondary | ICD-10-CM | POA: Diagnosis present

## 2015-02-20 DIAGNOSIS — D62 Acute posthemorrhagic anemia: Secondary | ICD-10-CM | POA: Diagnosis not present

## 2015-02-20 DIAGNOSIS — D869 Sarcoidosis, unspecified: Secondary | ICD-10-CM | POA: Diagnosis present

## 2015-02-20 DIAGNOSIS — Z96649 Presence of unspecified artificial hip joint: Secondary | ICD-10-CM

## 2015-02-20 DIAGNOSIS — I10 Essential (primary) hypertension: Secondary | ICD-10-CM | POA: Diagnosis present

## 2015-02-20 DIAGNOSIS — M1610 Unilateral primary osteoarthritis, unspecified hip: Secondary | ICD-10-CM | POA: Diagnosis present

## 2015-02-20 DIAGNOSIS — Z6841 Body Mass Index (BMI) 40.0 and over, adult: Secondary | ICD-10-CM | POA: Diagnosis not present

## 2015-02-20 DIAGNOSIS — M1612 Unilateral primary osteoarthritis, left hip: Secondary | ICD-10-CM | POA: Diagnosis present

## 2015-02-20 HISTORY — PX: TOTAL HIP ARTHROPLASTY: SHX124

## 2015-02-20 SURGERY — ARTHROPLASTY, HIP, TOTAL,POSTERIOR APPROACH
Anesthesia: General | Site: Hip | Laterality: Left

## 2015-02-20 MED ORDER — DIPHENHYDRAMINE HCL 12.5 MG/5ML PO ELIX
12.5000 mg | ORAL_SOLUTION | ORAL | Status: DC | PRN
Start: 2015-02-20 — End: 2015-02-22

## 2015-02-20 MED ORDER — MIDAZOLAM HCL 2 MG/2ML IJ SOLN
INTRAMUSCULAR | Status: AC
  Filled 2015-02-20: qty 4

## 2015-02-20 MED ORDER — MIDAZOLAM HCL 5 MG/5ML IJ SOLN
INTRAMUSCULAR | Status: DC | PRN
  Administered 2015-02-20 (×2): 1 mg via INTRAVENOUS

## 2015-02-20 MED ORDER — KCL IN DEXTROSE-NACL 20-5-0.45 MEQ/L-%-% IV SOLN
INTRAVENOUS | Status: DC
  Administered 2015-02-20: 17:00:00 via INTRAVENOUS
  Filled 2015-02-20: qty 1000

## 2015-02-20 MED ORDER — METHOCARBAMOL 500 MG PO TABS
ORAL_TABLET | ORAL | Status: AC
  Administered 2015-02-20: 500 mg via ORAL
  Filled 2015-02-20: qty 1

## 2015-02-20 MED ORDER — ASPIRIN EC 325 MG PO TBEC
325.0000 mg | DELAYED_RELEASE_TABLET | Freq: Every day | ORAL | Status: DC
  Administered 2015-02-21 – 2015-02-22 (×2): 325 mg via ORAL
  Filled 2015-02-20 (×2): qty 1

## 2015-02-20 MED ORDER — PROPOFOL 10 MG/ML IV BOLUS
INTRAVENOUS | Status: AC
  Filled 2015-02-20: qty 20

## 2015-02-20 MED ORDER — HALOBETASOL PROPIONATE 0.05 % EX CREA: 1.0000 "application " | TOPICAL_CREAM | Freq: Two times a day (BID) | CUTANEOUS | Status: DC | PRN

## 2015-02-20 MED ORDER — ACETAMINOPHEN 650 MG RE SUPP: 650.0000 mg | Freq: Four times a day (QID) | RECTAL | Status: DC | PRN

## 2015-02-20 MED ORDER — IPRATROPIUM BROMIDE 0.03 % NA SOLN: 2.0000 | Freq: Two times a day (BID) | NASAL | Status: DC

## 2015-02-20 MED ORDER — TRANEXAMIC ACID 1000 MG/10ML IV SOLN
2000.0000 mg | INTRAVENOUS | Status: AC
  Administered 2015-02-20: 2000 mg via TOPICAL
  Filled 2015-02-20: qty 20

## 2015-02-20 MED ORDER — ONDANSETRON HCL 4 MG/2ML IJ SOLN
INTRAMUSCULAR | Status: DC | PRN
Start: 2015-02-20 — End: 2015-02-20
  Administered 2015-02-20: 4 mg via INTRAVENOUS

## 2015-02-20 MED ORDER — GLYCOPYRROLATE 0.2 MG/ML IJ SOLN
INTRAMUSCULAR | Status: AC
  Filled 2015-02-20: qty 4

## 2015-02-20 MED ORDER — FENTANYL CITRATE (PF) 250 MCG/5ML IJ SOLN
INTRAMUSCULAR | Status: AC
  Filled 2015-02-20: qty 5

## 2015-02-20 MED ORDER — DEXAMETHASONE SODIUM PHOSPHATE 10 MG/ML IJ SOLN
10.0000 mg | Freq: Once | INTRAMUSCULAR | Status: AC
  Administered 2015-02-21: 10 mg via INTRAVENOUS
  Filled 2015-02-20: qty 1

## 2015-02-20 MED ORDER — OXYCODONE HCL 5 MG PO TABS
5.0000 mg | ORAL_TABLET | ORAL | Status: DC | PRN
  Administered 2015-02-20 – 2015-02-22 (×14): 10 mg via ORAL
  Filled 2015-02-20 (×13): qty 2

## 2015-02-20 MED ORDER — ONDANSETRON HCL 4 MG/2ML IJ SOLN: 4.0000 mg | Freq: Four times a day (QID) | INTRAMUSCULAR | Status: DC | PRN

## 2015-02-20 MED ORDER — LIDOCAINE HCL (CARDIAC) 20 MG/ML IV SOLN
INTRAVENOUS | Status: DC | PRN
  Administered 2015-02-20: 100 mg via INTRAVENOUS

## 2015-02-20 MED ORDER — ROCURONIUM BROMIDE 100 MG/10ML IV SOLN
INTRAVENOUS | Status: DC | PRN
  Administered 2015-02-20: 50 mg via INTRAVENOUS

## 2015-02-20 MED ORDER — SUCCINYLCHOLINE CHLORIDE 20 MG/ML IJ SOLN
INTRAMUSCULAR | Status: AC
  Filled 2015-02-20: qty 1

## 2015-02-20 MED ORDER — ONDANSETRON HCL 4 MG/2ML IJ SOLN: 4.0000 mg | Freq: Once | INTRAMUSCULAR | Status: DC | PRN

## 2015-02-20 MED ORDER — ALBUMIN HUMAN 5 % IV SOLN
INTRAVENOUS | Status: DC | PRN
  Administered 2015-02-20: 10:00:00 via INTRAVENOUS

## 2015-02-20 MED ORDER — LABETALOL HCL 5 MG/ML IV SOLN
INTRAVENOUS | Status: DC | PRN
  Administered 2015-02-20: 5 mg via INTRAVENOUS

## 2015-02-20 MED ORDER — NEOSTIGMINE METHYLSULFATE 10 MG/10ML IV SOLN
INTRAVENOUS | Status: DC | PRN
  Administered 2015-02-20 (×2): 2.5 mg via INTRAVENOUS

## 2015-02-20 MED ORDER — METOCLOPRAMIDE HCL 5 MG PO TABS: 5.0000 mg | ORAL_TABLET | Freq: Three times a day (TID) | ORAL | Status: DC | PRN

## 2015-02-20 MED ORDER — NEOSTIGMINE METHYLSULFATE 10 MG/10ML IV SOLN
INTRAVENOUS | Status: AC
  Filled 2015-02-20: qty 1

## 2015-02-20 MED ORDER — PROPOFOL 10 MG/ML IV BOLUS
INTRAVENOUS | Status: DC | PRN
  Administered 2015-02-20: 170 mg via INTRAVENOUS

## 2015-02-20 MED ORDER — METHOCARBAMOL 500 MG PO TABS
500.0000 mg | ORAL_TABLET | Freq: Four times a day (QID) | ORAL | Status: DC | PRN
  Administered 2015-02-20 – 2015-02-22 (×7): 500 mg via ORAL
  Filled 2015-02-20 (×6): qty 1

## 2015-02-20 MED ORDER — OXYCODONE HCL 5 MG PO TABS
ORAL_TABLET | ORAL | Status: AC
  Filled 2015-02-20: qty 2

## 2015-02-20 MED ORDER — LORATADINE 10 MG PO TABS: 10.0000 mg | ORAL_TABLET | Freq: Every day | ORAL | Status: DC | PRN

## 2015-02-20 MED ORDER — PHENOL 1.4 % MT LIQD: 1.0000 | OROMUCOSAL | Status: DC | PRN

## 2015-02-20 MED ORDER — PHENYLEPHRINE HCL 10 MG/ML IJ SOLN
10.0000 mg | INTRAVENOUS | Status: DC | PRN
  Administered 2015-02-20: 40 ug/min via INTRAVENOUS

## 2015-02-20 MED ORDER — HYDROMORPHONE HCL 1 MG/ML IJ SOLN
INTRAMUSCULAR | Status: AC
  Administered 2015-02-20: 0.5 mg via INTRAVENOUS
  Filled 2015-02-20: qty 1

## 2015-02-20 MED ORDER — ROCURONIUM BROMIDE 50 MG/5ML IV SOLN
INTRAVENOUS | Status: AC
  Filled 2015-02-20: qty 1

## 2015-02-20 MED ORDER — ACETAMINOPHEN 325 MG PO TABS: 650.0000 mg | ORAL_TABLET | Freq: Four times a day (QID) | ORAL | Status: DC | PRN

## 2015-02-20 MED ORDER — MENTHOL 3 MG MT LOZG
1.0000 | LOZENGE | OROMUCOSAL | Status: DC | PRN
  Filled 2015-02-20: qty 9

## 2015-02-20 MED ORDER — SUCCINYLCHOLINE CHLORIDE 20 MG/ML IJ SOLN
INTRAMUSCULAR | Status: DC | PRN
  Administered 2015-02-20: 200 mg via INTRAVENOUS

## 2015-02-20 MED ORDER — HYDROMORPHONE HCL 1 MG/ML IJ SOLN
0.2500 mg | INTRAMUSCULAR | Status: DC | PRN
  Administered 2015-02-20 (×4): 0.5 mg via INTRAVENOUS

## 2015-02-20 MED ORDER — HYDROMORPHONE HCL 1 MG/ML IJ SOLN: 0.5000 mg | INTRAMUSCULAR | Status: DC

## 2015-02-20 MED ORDER — METHOCARBAMOL 1000 MG/10ML IJ SOLN
500.0000 mg | Freq: Four times a day (QID) | INTRAVENOUS | Status: DC | PRN
  Filled 2015-02-20: qty 5

## 2015-02-20 MED ORDER — LABETALOL HCL 5 MG/ML IV SOLN
INTRAVENOUS | Status: AC
  Filled 2015-02-20: qty 4

## 2015-02-20 MED ORDER — TRANEXAMIC ACID 1000 MG/10ML IV SOLN
1000.0000 mg | INTRAVENOUS | Status: AC
  Administered 2015-02-20: 1000 mg via INTRAVENOUS
  Filled 2015-02-20: qty 10

## 2015-02-20 MED ORDER — HYDROMORPHONE HCL 1 MG/ML IJ SOLN
0.5000 mg | INTRAMUSCULAR | Status: DC | PRN
  Administered 2015-02-21: 1 mg via INTRAVENOUS
  Filled 2015-02-20: qty 1

## 2015-02-20 MED ORDER — LOSARTAN POTASSIUM 50 MG PO TABS
100.0000 mg | ORAL_TABLET | Freq: Every day | ORAL | Status: DC
  Administered 2015-02-20 – 2015-02-22 (×3): 100 mg via ORAL
  Filled 2015-02-20 (×3): qty 2

## 2015-02-20 MED ORDER — CEFUROXIME SODIUM 1.5 G IJ SOLR
INTRAMUSCULAR | Status: AC
  Filled 2015-02-20: qty 1.5

## 2015-02-20 MED ORDER — BUPIVACAINE-EPINEPHRINE (PF) 0.5% -1:200000 IJ SOLN
INTRAMUSCULAR | Status: AC
  Filled 2015-02-20: qty 30

## 2015-02-20 MED ORDER — HYDROMORPHONE HCL 1 MG/ML IJ SOLN
0.5000 mg | Freq: Once | INTRAMUSCULAR | Status: AC
  Administered 2015-02-20: 0.5 mg via INTRAVENOUS

## 2015-02-20 MED ORDER — ONDANSETRON HCL 4 MG/2ML IJ SOLN
INTRAMUSCULAR | Status: AC
  Filled 2015-02-20: qty 4

## 2015-02-20 MED ORDER — FENTANYL CITRATE (PF) 100 MCG/2ML IJ SOLN
INTRAMUSCULAR | Status: DC | PRN
  Administered 2015-02-20 (×3): 100 ug via INTRAVENOUS
  Administered 2015-02-20 (×2): 50 ug via INTRAVENOUS
  Administered 2015-02-20: 100 ug via INTRAVENOUS

## 2015-02-20 MED ORDER — LACTATED RINGERS IV SOLN
INTRAVENOUS | Status: DC
  Administered 2015-02-20 (×3): via INTRAVENOUS

## 2015-02-20 MED ORDER — SODIUM CHLORIDE 0.9 % IR SOLN
Status: DC | PRN
  Administered 2015-02-20: 1000 mL

## 2015-02-20 MED ORDER — GLYCOPYRROLATE 0.2 MG/ML IJ SOLN
INTRAMUSCULAR | Status: DC | PRN
  Administered 2015-02-20 (×2): 0.4 mg via INTRAVENOUS

## 2015-02-20 MED ORDER — MEPERIDINE HCL 25 MG/ML IJ SOLN: 6.2500 mg | INTRAMUSCULAR | Status: DC | PRN

## 2015-02-20 MED ORDER — ONDANSETRON HCL 4 MG PO TABS: 4.0000 mg | ORAL_TABLET | Freq: Four times a day (QID) | ORAL | Status: DC | PRN

## 2015-02-20 MED ORDER — ALUMINUM HYDROXIDE GEL 320 MG/5ML PO SUSP
15.0000 mL | ORAL | Status: DC | PRN
  Administered 2015-02-22: 30 mL via ORAL
  Filled 2015-02-20 (×3): qty 30

## 2015-02-20 MED ORDER — BUPIVACAINE-EPINEPHRINE 0.5% -1:200000 IJ SOLN
INTRAMUSCULAR | Status: DC | PRN
  Administered 2015-02-20: 30 mL

## 2015-02-20 MED ORDER — HYDROMORPHONE HCL 1 MG/ML IJ SOLN
INTRAMUSCULAR | Status: AC
  Administered 2015-02-20: 0.5 mg
  Filled 2015-02-20: qty 1

## 2015-02-20 MED ORDER — METOCLOPRAMIDE HCL 5 MG/ML IJ SOLN: 5.0000 mg | Freq: Three times a day (TID) | INTRAMUSCULAR | Status: DC | PRN

## 2015-02-20 MED ORDER — AMLODIPINE BESYLATE 10 MG PO TABS
10.0000 mg | ORAL_TABLET | Freq: Every day | ORAL | Status: DC
  Administered 2015-02-20 – 2015-02-22 (×3): 10 mg via ORAL
  Filled 2015-02-20 (×3): qty 1

## 2015-02-20 MED ORDER — LIDOCAINE HCL (CARDIAC) 20 MG/ML IV SOLN
INTRAVENOUS | Status: AC
  Filled 2015-02-20: qty 5

## 2015-02-20 MED ORDER — ONDANSETRON HCL 4 MG/2ML IJ SOLN
INTRAMUSCULAR | Status: AC
  Filled 2015-02-20: qty 2

## 2015-02-20 SURGICAL SUPPLY — 64 items
BAG DECANTER FOR FLEXI CONT (MISCELLANEOUS) ×1 IMPLANT
BLADE SAW SGTL 18X1.27X75 (BLADE) ×2 IMPLANT
BNDG COHESIVE 6X5 TAN STRL LF (GAUZE/BANDAGES/DRESSINGS) ×1 IMPLANT
BRUSH FEMORAL CANAL (MISCELLANEOUS) IMPLANT
CAPT HIP TOTAL 2 ×1 IMPLANT
COVER BACK TABLE 24X17X13 BIG (DRAPES) IMPLANT
COVER SURGICAL LIGHT HANDLE (MISCELLANEOUS) ×3 IMPLANT
DRAPE IMP U-DRAPE 54X76 (DRAPES) ×2 IMPLANT
DRAPE INCISE IOBAN 66X45 STRL (DRAPES) ×1 IMPLANT
DRAPE ORTHO SPLIT 77X108 STRL (DRAPES) ×2
DRAPE PROXIMA HALF (DRAPES) ×2 IMPLANT
DRAPE SURG ORHT 6 SPLT 77X108 (DRAPES) ×1 IMPLANT
DRAPE U-SHAPE 47X51 STRL (DRAPES) ×2 IMPLANT
DRILL BIT 7/64X5 (BIT) ×2 IMPLANT
DRSG AQUACEL AG ADV 3.5X10 (GAUZE/BANDAGES/DRESSINGS) ×1 IMPLANT
DRSG AQUACEL AG ADV 3.5X14 (GAUZE/BANDAGES/DRESSINGS) ×1 IMPLANT
DURAPREP 26ML APPLICATOR (WOUND CARE) ×3 IMPLANT
ELECT BLADE 4.0 EZ CLEAN MEGAD (MISCELLANEOUS) ×2
ELECT BLADE 6.5 EXT (BLADE) ×1 IMPLANT
ELECT REM PT RETURN 9FT ADLT (ELECTROSURGICAL) ×2
ELECTRODE BLDE 4.0 EZ CLN MEGD (MISCELLANEOUS) IMPLANT
ELECTRODE REM PT RTRN 9FT ADLT (ELECTROSURGICAL) ×1 IMPLANT
GLOVE BIO SURGEON STRL SZ7.5 (GLOVE) ×5 IMPLANT
GLOVE BIO SURGEON STRL SZ8.5 (GLOVE) ×2 IMPLANT
GLOVE BIOGEL PI IND STRL 6.5 (GLOVE) IMPLANT
GLOVE BIOGEL PI IND STRL 7.5 (GLOVE) IMPLANT
GLOVE BIOGEL PI IND STRL 8 (GLOVE) ×2 IMPLANT
GLOVE BIOGEL PI IND STRL 9 (GLOVE) ×1 IMPLANT
GLOVE BIOGEL PI INDICATOR 6.5 (GLOVE) ×1
GLOVE BIOGEL PI INDICATOR 7.5 (GLOVE) ×1
GLOVE BIOGEL PI INDICATOR 8 (GLOVE) ×1
GLOVE BIOGEL PI INDICATOR 9 (GLOVE) ×1
GLOVE SURG SS PI 6.0 STRL IVOR (GLOVE) ×2 IMPLANT
GOWN STRL REUS W/ TWL LRG LVL3 (GOWN DISPOSABLE) ×2 IMPLANT
GOWN STRL REUS W/ TWL XL LVL3 (GOWN DISPOSABLE) ×2 IMPLANT
GOWN STRL REUS W/TWL LRG LVL3 (GOWN DISPOSABLE) ×2
GOWN STRL REUS W/TWL XL LVL3 (GOWN DISPOSABLE) ×6
HANDPIECE INTERPULSE COAX TIP (DISPOSABLE)
HOOD PEEL AWAY FACE SHEILD DIS (HOOD) ×5 IMPLANT
KIT BASIN OR (CUSTOM PROCEDURE TRAY) ×2 IMPLANT
KIT ROOM TURNOVER OR (KITS) ×2 IMPLANT
MANIFOLD NEPTUNE II (INSTRUMENTS) ×2 IMPLANT
NEEDLE 22X1 1/2 (OR ONLY) (NEEDLE) ×2 IMPLANT
NS IRRIG 1000ML POUR BTL (IV SOLUTION) ×2 IMPLANT
PACK TOTAL JOINT (CUSTOM PROCEDURE TRAY) ×2 IMPLANT
PACK UNIVERSAL I (CUSTOM PROCEDURE TRAY) ×2 IMPLANT
PAD ARMBOARD 7.5X6 YLW CONV (MISCELLANEOUS) ×4 IMPLANT
PASSER SUT SWANSON 36MM LOOP (INSTRUMENTS) ×2 IMPLANT
PRESSURIZER FEMORAL UNIV (MISCELLANEOUS) IMPLANT
SET HNDPC FAN SPRY TIP SCT (DISPOSABLE) IMPLANT
SPONGE LAP 18X18 X RAY DECT (DISPOSABLE) ×2 IMPLANT
SUT ETHIBOND 2 V 37 (SUTURE) ×2 IMPLANT
SUT VIC AB 0 CTB1 27 (SUTURE) ×2 IMPLANT
SUT VIC AB 1 CTX 36 (SUTURE) ×2
SUT VIC AB 1 CTX36XBRD ANBCTR (SUTURE) ×1 IMPLANT
SUT VIC AB 2-0 CTB1 (SUTURE) ×3 IMPLANT
SUT VIC AB 3-0 SH 27 (SUTURE) ×4
SUT VIC AB 3-0 SH 27X BRD (SUTURE) ×1 IMPLANT
SYR CONTROL 10ML LL (SYRINGE) ×2 IMPLANT
TOWEL OR 17X24 6PK STRL BLUE (TOWEL DISPOSABLE) ×2 IMPLANT
TOWEL OR 17X26 10 PK STRL BLUE (TOWEL DISPOSABLE) ×2 IMPLANT
TOWER CARTRIDGE SMART MIX (DISPOSABLE) IMPLANT
TRAY FOLEY CATH 14FR (SET/KITS/TRAYS/PACK) IMPLANT
WATER STERILE IRR 1000ML POUR (IV SOLUTION) ×8 IMPLANT

## 2015-02-20 NOTE — Interval H&P Note (Signed)
History and Physical Interval Note:  02/20/2015 8:43 AM  Brent Mccoy  has presented today for surgery, with the diagnosis of LEFT HIP DEGENERATIVE JOINT DISEASE  The various methods of treatment have been discussed with the patient and family. After consideration of risks, benefits and other options for treatment, the patient has consented to  Procedure(s): TOTAL HIP ARTHROPLASTY (Left) as a surgical intervention .  The patient's history has been reviewed, patient examined, no change in status, stable for surgery.  I have reviewed the patient's chart and labs.  Questions were answered to the patient's satisfaction.     Kerin Salen

## 2015-02-20 NOTE — Anesthesia Preprocedure Evaluation (Addendum)
Anesthesia Evaluation  Patient identified by MRN, date of birth, ID band Patient awake    Reviewed: Allergy & Precautions, NPO status , Patient's Chart, lab work & pertinent test results  Airway Mallampati: II  TM Distance: >3 FB Neck ROM: Full    Dental  (+) Teeth Intact, Dental Advisory Given   Pulmonary sleep apnea and Continuous Positive Airway Pressure Ventilation ,    Pulmonary exam normal        Cardiovascular Pt. on medications Normal cardiovascular exam     Neuro/Psych    GI/Hepatic   Endo/Other    Renal/GU      Musculoskeletal   Abdominal   Peds  Hematology   Anesthesia Other Findings Full Beard  Reproductive/Obstetrics                           Anesthesia Physical Anesthesia Plan  ASA: III  Anesthesia Plan: General   Post-op Pain Management:    Induction: Intravenous  Airway Management Planned: Oral ETT  Additional Equipment:   Intra-op Plan:   Post-operative Plan: Extubation in OR  Informed Consent: I have reviewed the patients History and Physical, chart, labs and discussed the procedure including the risks, benefits and alternatives for the proposed anesthesia with the patient or authorized representative who has indicated his/her understanding and acceptance.     Plan Discussed with: CRNA and Surgeon  Anesthesia Plan Comments:         Anesthesia Quick Evaluation

## 2015-02-20 NOTE — Transfer of Care (Signed)
Immediate Anesthesia Transfer of Care Note  Patient: Brent Mccoy  Procedure(s) Performed: Procedure(s): TOTAL HIP ARTHROPLASTY; LEFT (Left)  Patient Location: PACU  Anesthesia Type:General  Level of Consciousness: awake and patient cooperative  Airway & Oxygen Therapy: Patient Spontanous Breathing and Patient connected to face mask oxygen  Post-op Assessment: Report given to RN, Post -op Vital signs reviewed and stable and Patient moving all extremities  Post vital signs: Reviewed and stable  Last Vitals:  Filed Vitals:   02/20/15 1137  BP:   Pulse:   Temp: 36.8 C  Resp:     Complications: No apparent anesthesia complications

## 2015-02-20 NOTE — Anesthesia Postprocedure Evaluation (Signed)
Anesthesia Post Note  Patient: Brent Mccoy  Procedure(s) Performed: Procedure(s) (LRB): TOTAL HIP ARTHROPLASTY; LEFT (Left)  Anesthesia type: general  Patient location: PACU  Post pain: Pain level controlled  Post assessment: Patient's Cardiovascular Status Stable  Last Vitals:  Filed Vitals:   02/20/15 1333  BP:   Pulse:   Temp: 36.5 C  Resp:     Post vital signs: Reviewed and stable  Level of consciousness: sedated  Complications: No apparent anesthesia complications

## 2015-02-20 NOTE — Anesthesia Procedure Notes (Signed)
Procedure Name: Intubation Date/Time: 02/20/2015 9:13 AM Performed by: Terrill Mohr Pre-anesthesia Checklist: Patient identified, Emergency Drugs available, Suction available and Patient being monitored Patient Re-evaluated:Patient Re-evaluated prior to inductionOxygen Delivery Method: Circle system utilized Preoxygenation: Pre-oxygenation with 100% oxygen Intubation Type: IV induction Ventilation: Mask ventilation without difficulty Laryngoscope Size: Mac and 4 Grade View: Grade II Tube type: Oral Tube size: 7.5 mm Number of attempts: 1 Airway Equipment and Method: Stylet Placement Confirmation: ETT inserted through vocal cords under direct vision,  breath sounds checked- equal and bilateral and positive ETCO2 Secured at: 23 (cm at teeth) cm Tube secured with: Tape Dental Injury: Teeth and Oropharynx as per pre-operative assessment

## 2015-02-20 NOTE — Op Note (Signed)
PATIENT ID:      Brent Mccoy  MRN:     [id number] DOB/AGE:    Aug 16, 1960 / 54 y.o.       OPERATIVE REPORT    DATE OF PROCEDURE:  02/20/2015       PREOPERATIVE DIAGNOSIS:  LEFT HIP DEGENERATIVE JOINT DISEASE                                                       Estimated body mass index is 44.6 kg/(m^2) as calculated from the following:   Height as of 02/09/15: 6\' 3"  (1.905 m).   Weight as of this encounter: 161.843 kg (356 lb 12.8 oz).     POSTOPERATIVE DIAGNOSIS:  Same                                                          PROCEDURE:  L total hip arthroplasty using a 54 mm DePuy Pinnacle  Cup, [employer], 10-degree polyethylene liner index superior  and posterior, a +3 36 mm ceramic head, a [phone] SROM Stem, 20FXXL Sleeve  SURGEON: Kain Milosevic J    ASSISTANT:   Eric K. Barton Dubois  (present throughout entire procedure and necessary for timely completion of the procedure)  ANESTHESIA: GET  BLOOD LOSS: 500 FLUID REPLACEMENT: 1800 crystalloid Tranexamic Acid: 1gm iv, 2gm topical DRAINS: None COMPLICATIONS: None    INDICATIONS FOR PROCEDURE:Patient with end-stage arthritis of the L hip.  X-rays show bone-on-bone arthritic changes, peri chondral cyts. Despite conservative measures with observation, anti-inflammatory medicine, narcotics, use of a cane, has severe unremitting pain and can ambulate only 1 blocks before resting.  Patient desires elective right total hip arthroplasty to decrease pain and increase function. The risks, benefits, and alternatives were discussed at length including but not limited to the risks of infection, bleeding, nerve injury, stiffness, blood clots, the need for revision surgery, cardiopulmonary complications, among others, and they were willing to proceed.Benefits have been discussed. Questions answered.     PROCEDURE IN DETAIL: The patient was identified by armband,  received preoperative IV antibiotics in the holding area at [facility], taken to the operating room , appropriate anesthetic monitors  were attached and general endotracheal anesthesia induced. Foley catheter was inserted. Patient was rolled into the R lateral decubitus position and fixed there with a Stulberg Mark II pelvic clamp and the L lower extremity was then prepped and draped  in the usual sterile fashion from the ankle to the hemipelvis. A time-out  procedure was performed. The skin along the lateral hip and thigh  infiltrated with 10 mL of 0.5% Marcaine and epinephrine solution. We  then made a posterolateral approach to the hip. With a #10 blade, 25 cm  incision through skin and subcutaneous tissue down to the level of the  IT band. Small bleeders were identified and cauterized. IT band cut in  line with skin incision exposing the greater trochanter. A Cobra retractor was placed between the gluteus minimus and the superior hip joint capsule, and a spiked Cobra between the quadratus femoris and the inferior hip joint capsule. This isolated the short  external rotators  and piriformis tendons. These were tagged with a #2 Ethibond  suture and cut off their insertion on the intertrochanteric crest. The posterior  capsule was then developed into an acetabular-based flap from Posterior Superior off of the acetabulum out over the femoral neck and back posterior inferior to the acetabular rim. This flap was tagged with two #2 Ethibond sutures and retracted protecting the sciatic nerve. This exposed the arthritic femoral head and osteophytes. The hip was then flexed and internally rotated, dislocating the femoral head and a standard neck cut performed 1 fingerbreadth above the lesser trochanter.  A spiked Cobra was placed in the cotyloid notch and a Hohmann retractor was then used to lever the femur anteriorly off of the anterior pelvic column. A posterior-inferior wing retractor was placed at the junction of the acetabulum and the ischium completing the  acetabular exposure.We then removed the peripheral osteophytes and labrum from the acetabulum. We then reamed the acetabulum up to 53 mm with basket reamers obtaining good coverage in all quadrants, irrigated out with normal  saline solution and hammered into place a 54 mm pinnacle cup in 45  degrees of abduction and about 20 degrees of anteversion. More  peripheral osteophytes removed, the apex hole eliminator was placed, and a 10-degree liner placed with the  Index post sup. The hip was then flexed and internally rotated exposing the  proximal femur, which was entered with the box cutting chisel, the initiating reamer followed by axial reaming up to 15.67mm full depth, and 16 partial depth. We then conically reamed up to 50F. And milled the calcar to 50FXXL. We then placed the trial sleeve, and a trial stem. A trial reduction was then  performed with a +0  36-mm ball on the standard neck and  excellent stability was noted with at 90 of flexion with 70 of  internal rotation and then full extension withexternal rotation. The hip  could not be dislocated in full extension. The knee could easily flex  to about 140 degrees. We also stretched the abductors at this point,  because of the preexisting adductor contractures. All trial components  were then removed. The real sleeve was then hammered into place, through the sleeve we reamed with a 15.5 reamer to ensure that the stem did not become incarcerated. The stem itself was then inserted in 0 deg in relation to the calcar. At this point, a + 310/31/2016 36-mm ceramic head was  hammered on the stem. The hip was reduced. We checked our stability  one more time and found to be excellent. The wound was once again  thoroughly irrigated out with normal saline solution pulse lavage. The  capsular flap and short external rotators were repaired back to the  intertrochanteric crest through drill holes with a #2 Ethibond suture.  The IT band was closed with  running 1 Vicryl suture. The subcutaneous  tissue with 0 and 2-0 undyed Vicryl suture and the skin with running  3-0 Vivryl SQ suture. Aquacil dessing was applied. The patient was then unclamped, rolled supine, awaken extubated and taken to recovery room without difficulty in stable condition.   Isaac Dubie J 02/20/2015, 10:35 AM

## 2015-02-20 NOTE — Evaluation (Signed)
Physical Therapy Evaluation Patient Details Name: Brent Mccoy MRN: [id number] DOB: 1960/12/28 Today's Date: 02/20/2015   History of Present Illness  54 y.o. male s/p Lt posterior THA. PMH: HTN, sarcoidosis.  Clinical Impression  Pt is s/p THA resulting in the deficits listed below (see PT Problem List).  Pt will benefit from skilled PT to increase their independence and safety with mobility to allow discharge to home with family assistance. Patient able to ambulate to chair during first PT session. Will progress mobility and independence as tolerated.       Follow Up Recommendations Home health PT;Supervision for mobility/OOB    Equipment Recommendations  Rolling walker with 5" wheels;Other (comment) (bariatric)    Recommendations for Other Services       Precautions / Restrictions Precautions Precautions: Posterior Hip;Fall Precaution Booklet Issued: Yes (comment) Precaution Comments: precautions reviewed with patient and spouse, posted in room.  Restrictions Weight Bearing Restrictions: Yes LLE Weight Bearing: Weight bearing as tolerated      Mobility  Bed Mobility Overal bed mobility: Needs Assistance Bed Mobility: Supine to Sit     Supine to sit: Min guard;HOB elevated     General bed mobility comments: reminder for hip precautions with mobiltiy  Transfers Overall transfer level: Needs assistance Equipment used: None Transfers: Sit to/from Stand Sit to Stand: Min guard         General transfer comment: cues for hip precautions  Ambulation/Gait Ambulation/Gait assistance: Min guard Ambulation Distance (Feet): 5 Feet Assistive device: Rolling walker (2 wheeled) Gait Pattern/deviations: Step-to pattern;Decreased step length - left;Decreased weight shift to left Gait velocity: decreased   General Gait Details: able to bear partial weight through LLE with ambulation  Stairs            Wheelchair Mobility    Modified Rankin (Stroke Patients  Only)       Balance                                             Pertinent Vitals/Pain Pain Assessment: No/denies pain    Home Living Family/patient expects to be discharged to:: Private residence Living Arrangements: Spouse/significant other;Children Available Help at Discharge: Family;Available 24 hours/day Type of Home: House Home Access: Stairs to enter Entrance Stairs-Rails: None Entrance Stairs-Number of Steps: 2 Home Layout: One level Home Equipment: Cane - single point      Prior Function Level of Independence: Independent;Independent with assistive device(s) (occasional use of cane)               Hand Dominance        Extremity/Trunk Assessment               Lower Extremity Assessment: LLE deficits/detail   LLE Deficits / Details: able to move LLE in bed without assistance.     Communication   Communication: No difficulties  Cognition Arousal/Alertness: Awake/alert Behavior During Therapy: WFL for tasks assessed/performed Overall Cognitive Status: Within Functional Limits for tasks assessed                      General Comments      Exercises        Assessment/Plan    PT Assessment Patient needs continued PT services  PT Diagnosis Difficulty walking   PT Problem List Decreased strength;Decreased range of motion;Decreased activity tolerance;Decreased balance;Decreased mobility;Decreased knowledge of precautions  PT Treatment  Interventions DME instruction;Gait training;Stair training;Functional mobility training;Therapeutic activities;Therapeutic exercise;Balance training;Patient/family education   PT Goals (Current goals can be found in the Care Plan section) Acute Rehab PT Goals Patient Stated Goal: go home  PT Goal Formulation: With patient Time For Goal Achievement: 03/06/15 Potential to Achieve Goals: Good    Frequency 7X/week   Barriers to discharge        Co-evaluation                End of Session   Activity Tolerance: Patient tolerated treatment well Patient left: in chair;with call bell/phone within reach;with family/visitor present Nurse Communication: Mobility status;Precautions;Weight bearing status         Time: 1610-9604 PT Time Calculation (min) (ACUTE ONLY): 28 min   Charges:   PT Evaluation $Initial PT Evaluation Tier I: 1 Procedure PT Treatments $Therapeutic Activity: 8-22 mins   PT G Codes:        Cassell Clement, PT, CSCS Pager [phone] Office [phone]  02/20/2015, 4:11 PM

## 2015-02-20 NOTE — Discharge Instructions (Signed)

## 2015-02-21 ENCOUNTER — Encounter (HOSPITAL_COMMUNITY): Payer: Self-pay | Admitting: Orthopedic Surgery

## 2015-02-21 LAB — CBC
HEMATOCRIT: 40.2 % (ref 39.0–52.0)
Hemoglobin: 13.1 g/dL (ref 13.0–17.0)
MCH: 29.6 pg (ref 26.0–34.0)
MCHC: 32.6 g/dL (ref 30.0–36.0)
MCV: 90.7 fL (ref 78.0–100.0)
Platelets: 158 10*3/uL (ref 150–400)
RBC: 4.43 MIL/uL (ref 4.22–5.81)
RDW: 13 % (ref 11.5–15.5)
WBC: 8.9 10*3/uL (ref 4.0–10.5)

## 2015-02-21 LAB — BASIC METABOLIC PANEL
ANION GAP: 10 (ref 5–15)
BUN: 15 mg/dL (ref 6–20)
CALCIUM: 8.4 mg/dL — AB (ref 8.9–10.3)
CO2: 26 mmol/L (ref 22–32)
Chloride: 98 mmol/L — ABNORMAL LOW (ref 101–111)
Creatinine, Ser: 1.57 mg/dL — ABNORMAL HIGH (ref 0.61–1.24)
GFR calc Af Amer: 56 mL/min — ABNORMAL LOW (ref >60)
GFR calc non Af Amer: 48 mL/min — ABNORMAL LOW (ref >60)
GLUCOSE: 136 mg/dL — AB (ref 65–99)
Potassium: 3.8 mmol/L (ref 3.5–5.1)
Sodium: 134 mmol/L — ABNORMAL LOW (ref 135–145)

## 2015-02-21 NOTE — Care Management (Signed)
Utilization review completed. Jaymir Struble, RN Case Manager [phone]. 

## 2015-02-21 NOTE — Progress Notes (Signed)
Physical Therapy Treatment Patient Details Name: KARIM AIELLO MRN: [id number] DOB: 1960-05-01 Today's Date: 02/21/2015    History of Present Illness 54 y.o. male s/p Lt posterior THA. PMH: HTN, sarcoidosis.    PT Comments    Pt moving with limited tolerance for hip movement on his own secondary to weakness and pain. Pt using momentum to stand and able to ambulate in hall today. Will plan to attempt stairs this afternoon. Pt able to state all precautions and maintain with activity with min cues.   Follow Up Recommendations  Home health PT;Supervision for mobility/OOB     Equipment Recommendations   (tall, bariatric RW)    Recommendations for Other Services       Precautions / Restrictions Precautions Precautions: Posterior Hip;Fall Precaution Comments: pt able to state all precautions Restrictions LLE Weight Bearing: Weight bearing as tolerated    Mobility  Bed Mobility Overal bed mobility: Needs Assistance Bed Mobility: Supine to Sit     Supine to sit: Min assist     General bed mobility comments: assist with cues for use of belt for pt to move LLE with bil UE and min assist to help get LLE to EOB  Transfers       Sit to Stand: Min guard         General transfer comment: cues for hip precautions and hand placement  Ambulation/Gait Ambulation/Gait assistance: Min guard Ambulation Distance (Feet): 140 Feet Assistive device: Rolling walker (2 wheeled) Gait Pattern/deviations: Step-to pattern;Decreased stance time - left;Decreased weight shift to left;Trunk flexed   Gait velocity interpretation: Below normal speed for age/gender General Gait Details: cues for posture, postion in RW and sequence   Stairs            Wheelchair Mobility    Modified Rankin (Stroke Patients Only)       Balance                                    Cognition Arousal/Alertness: Awake/alert Behavior During Therapy: WFL for tasks  assessed/performed Overall Cognitive Status: Within Functional Limits for tasks assessed                      Exercises Total Joint Exercises Hip ABduction/ADduction: AAROM;Seated;Left;10 reps Long Arc Quad: AAROM;Seated;Left;15 reps Marching in Standing: AAROM;Seated;Left;15 reps    General Comments        Pertinent Vitals/Pain Pain Assessment: 0-10 Pain Score: 2  Pain Location: left hip at rest, up to 8/10 with abduction Pain Descriptors / Indicators: Aching Pain Intervention(s): Limited activity within patient's tolerance;Repositioned    Home Living                      Prior Function            PT Goals (current goals can now be found in the care plan section) Progress towards PT goals: Progressing toward goals    Frequency       PT Plan Current plan remains appropriate    Co-evaluation             End of Session Equipment Utilized During Treatment: Gait belt Activity Tolerance: Patient tolerated treatment well Patient left: in chair;with call bell/phone within reach     Time: 0840-0912 PT Time Calculation (min) (ACUTE ONLY): 32 min  Charges:  $Gait Training: 8-22 mins $Therapeutic Exercise: 8-22 mins  G CodesMelford Aase Mar 17, 2015, 9:31 AM Elwyn Reach, [place]

## 2015-02-21 NOTE — Progress Notes (Signed)
Physical Therapy Treatment Patient Details Name: Brent Mccoy MRN: [id number] DOB: 1960/10/14 Today's Date: 02/21/2015    History of Present Illness 54 y.o. male s/p Lt posterior THA. PMH: HTN, sarcoidosis.    PT Comments    Pt continues to progress with gait but limited with decreased step length and strength LLE. Pt performed stairs and encouraged to continue HEP throughout the day. Pt with improved pain this afternoon from morning session.   Follow Up Recommendations  Home health PT;Supervision for mobility/OOB     Equipment Recommendations       Recommendations for Other Services       Precautions / Restrictions Precautions Precautions: Posterior Hip;Fall Precaution Comments: pt able to state all precautions. Needs cues with standing and turning Restrictions Weight Bearing Restrictions: Yes LLE Weight Bearing: Weight bearing as tolerated    Mobility  Bed Mobility               General bed mobility comments: in chair on arrival  Transfers     Transfers: Sit to/from Stand Sit to Stand: Min guard         General transfer comment: cues for hip precautions and hand placement  Ambulation/Gait Ambulation/Gait assistance: Supervision Ambulation Distance (Feet): 280 Feet Assistive device: Rolling walker (2 wheeled) Gait Pattern/deviations: Step-to pattern;Trunk flexed;Decreased stance time - left Gait velocity: decreased   General Gait Details: cues for posture, postion in RW and sequence   Stairs Stairs: Yes Stairs assistance: Min assist Stair Management: Backwards;With walker;Step to pattern Number of Stairs: 2 General stair comments: pt performed stairs forward with one rail but doesn't have rail only post at home. Performed 2nd trial with RW which pt preferred, wife educated and handout provided  Wheelchair Mobility    Modified Rankin (Stroke Patients Only)       Balance                                    Cognition  Arousal/Alertness: Awake/alert Behavior During Therapy: WFL for tasks assessed/performed Overall Cognitive Status: Within Functional Limits for tasks assessed                      Exercises Total Joint Exercises Hip ABduction/ADduction: AAROM;Seated;Left;15 reps Long Arc Quad: AAROM;Seated;Left;15 reps Marching in Standing: AAROM;Seated;Left;15 reps (limited to 90degrees)    General Comments        Pertinent Vitals/Pain Pain Score: 4  Pain Location: left hip throughout Pain Descriptors / Indicators: Aching Pain Intervention(s): Limited activity within patient's tolerance;Repositioned;RN gave pain meds during session    Home Living                      Prior Function            PT Goals (current goals can now be found in the care plan section) Progress towards PT goals: Progressing toward goals    Frequency       PT Plan Current plan remains appropriate    Co-evaluation             End of Session   Activity Tolerance: Patient tolerated treatment well Patient left: in chair;with call bell/phone within reach;with family/visitor present     Time: 8144-8185 PT Time Calculation (min) (ACUTE ONLY): 26 min  Charges:  $Gait Training: 8-22 mins $Therapeutic Exercise: 8-22 mins  G CodesLanetta Inch Beth 28-Feb-2015, 2:09 PM Elwyn Reach, [place]

## 2015-02-21 NOTE — Progress Notes (Signed)
Patient ID: Brent Mccoy, male   DOB: 1961-01-19, 54 y.o.   MRN: [id number] PATIENT ID: Brent Mccoy  MRN: [id number]  DOB/AGE:  Aug 29, 1960 / 54 y.o.  1 Day Post-Op Procedure(s) (LRB): TOTAL HIP ARTHROPLASTY; LEFT (Left)    PROGRESS NOTE Subjective: Patient is alert, oriented, no Nausea, no Vomiting, yes passing gas, no Bowel Movement. Taking PO well. Denies SOB, Chest or Calf Pain. Using Incentive Spirometer, PAS in place. Ambulate 5' Patient reports pain as 5 on 0-10 scale  .    Objective: Vital signs in last 24 hours: Filed Vitals:   02/20/15 1333 02/20/15 2031 02/21/15 0237 02/21/15 0707  BP:  150/88 123/72 124/73  Pulse:  102 96 96  Temp: 97.7 F (36.5 C) 99.1 F (37.3 C) 99.1 F (37.3 C) 98.6 F (37 C)  TempSrc:  Oral Oral   Resp:  16 17 17   Weight:      SpO2:  100% 92% 100%      Intake/Output from previous day: I/O last 3 completed shifts: In: 2650 [I.V.:2650] Out: 500 [Blood:500]   Intake/Output this shift:     LABORATORY DATA:  Recent Labs  02/21/15 0520  WBC 8.9  HGB 13.1  HCT 40.2  PLT 158  NA 134*  K 3.8  CL 98*  CO2 26  BUN 15  CREATININE 1.57*  GLUCOSE 136*  CALCIUM 8.4*    Examination: Neurologically intact ABD soft Neurovascular intact Sensation intact distally Intact pulses distally Dorsiflexion/Plantar flexion intact Incision: dressing C/D/I No cellulitis present Compartment soft} XR AP&Lat of hip shows well placed\fixed THA  Assessment:   1 Day Post-Op Procedure(s) (LRB): TOTAL HIP ARTHROPLASTY; LEFT (Left) ADDITIONAL DIAGNOSIS:  Expected Acute Blood Loss Anemia, morbid obesity, hypertension.  Plan: PT/OT WBAT, THA  posterior precautions  DVT Prophylaxis: SCDx72 hrs, ASA 325 mg BID x 2 weeks  DISCHARGE PLAN: Home, probably tomorrow  DISCHARGE NEEDS: HHPT, Walker and 3-in-1 comode seat

## 2015-02-22 LAB — CBC
HCT: 35.5 % — ABNORMAL LOW (ref 39.0–52.0)
Hemoglobin: 12 g/dL — ABNORMAL LOW (ref 13.0–17.0)
MCH: 30.5 pg (ref 26.0–34.0)
MCHC: 33.8 g/dL (ref 30.0–36.0)
MCV: 90.3 fL (ref 78.0–100.0)
Platelets: 134 10*3/uL — ABNORMAL LOW (ref 150–400)
RBC: 3.93 MIL/uL — ABNORMAL LOW (ref 4.22–5.81)
RDW: 13 % (ref 11.5–15.5)
WBC: 8.8 10*3/uL (ref 4.0–10.5)

## 2015-02-22 MED ORDER — METHOCARBAMOL 500 MG PO TABS: 500.0000 mg | ORAL_TABLET | Freq: Two times a day (BID) | ORAL | Status: DC

## 2015-02-22 MED ORDER — OXYCODONE-ACETAMINOPHEN 5-325 MG PO TABS: 1.0000 | ORAL_TABLET | ORAL | Status: DC | PRN

## 2015-02-22 MED ORDER — ASPIRIN EC 325 MG PO TBEC: 325.0000 mg | DELAYED_RELEASE_TABLET | Freq: Two times a day (BID) | ORAL | Status: DC

## 2015-02-22 NOTE — Progress Notes (Signed)
PATIENT ID: Brent Mccoy  MRN: [id number]  DOB/AGE:  [birth year] / 54 y.o.  2 Days Post-Op Procedure(s) (LRB): TOTAL HIP ARTHROPLASTY; LEFT (Left)    PROGRESS NOTE Subjective: Patient is alert, oriented, no Nausea, no Vomiting, yes passing gas, no Bowel Movement. Taking PO well. Denies SOB, Chest or Calf Pain. Using Incentive Spirometer, PAS in place. Ambulate WBAT with Pt walking 280 ft with therapy Patient reports pain as 2 on 0-10 scale  .    Objective: Vital signs in last 24 hours: Filed Vitals:   02/21/15 0707 02/21/15 1249 02/21/15 2048 02/22/15 0500  BP: 124/73 149/84 132/76 127/65  Pulse: 96 67 104 94  Temp: 98.6 F (37 C) 99.1 F (37.3 C) 98.7 F (37.1 C) 98 F (36.7 C)  TempSrc:   Oral Oral  Resp: 17 16 18 20   Weight:      SpO2: 100% 98% 100% 100%      Intake/Output from previous day: I/O last 3 completed shifts: In: 2759.6 [P.O.:720; I.V.:2039.6] Out: -    Intake/Output this shift:     LABORATORY DATA:  Recent Labs  02/21/15 0520 02/22/15 0540  WBC 8.9 8.8  HGB 13.1 12.0*  HCT 40.2 35.5*  PLT 158 134*  NA 134*  --   K 3.8  --   CL 98*  --   CO2 26  --   BUN 15  --   CREATININE 1.57*  --   GLUCOSE 136*  --   CALCIUM 8.4*  --     Examination: Neurologically intact Neurovascular intact Sensation intact distally Intact pulses distally Dorsiflexion/Plantar flexion intact Incision: dressing C/D/I No cellulitis present Compartment soft} XR AP&Lat of hip shows well placed\fixed THA  Assessment:   2 Days Post-Op Procedure(s) (LRB): TOTAL HIP ARTHROPLASTY; LEFT (Left) ADDITIONAL DIAGNOSIS:  Expected Acute Blood Loss Anemia, Hypertension and morbid obesity  Plan: PT/OT WBAT, THA  posterior precautions  DVT Prophylaxis: SCDx72 hrs, ASA 325 mg BID x 2 weeks  DISCHARGE PLAN: Home, today baring any setbacks  DISCHARGE NEEDS: HHPT, Walker and 3-in-1 comode seat

## 2015-02-22 NOTE — Discharge Summary (Signed)
Patient ID: Brent Mccoy MRN: [id number] DOB/AGE: June 05, 1960 54 y.o.  Admit date: 02/20/2015 Discharge date: 02/22/2015  Admission Diagnoses:  Principal Problem:   Primary osteoarthritis of left hip Active Problems:   Arthritis, hip   Discharge Diagnoses:  Same  Past Medical History  Diagnosis Date  . Sarcoidosis ([place])   . Allergic rhinitis   . Insomnia     takes Ambien nightly as needed  . Hypertension     takes Losartan and Amlodipine daily  . GERD (gastroesophageal reflux disease)     hx of - not current  . DJD (degenerative joint disease)   . Sleep apnea     Surgeries: Procedure(s): TOTAL HIP ARTHROPLASTY; LEFT on 02/20/2015   Consultants:    Discharged Condition: Improved  Hospital Course: Brent Mccoy is an 54 y.o. male who was admitted 02/20/2015 for operative treatment ofPrimary osteoarthritis of left hip. Patient has severe unremitting pain that affects sleep, daily activities, and work/hobbies. After pre-op clearance the patient was taken to the operating room on 02/20/2015 and underwent  Procedure(s): TOTAL HIP ARTHROPLASTY; LEFT.    Patient was given perioperative antibiotics: Anti-infectives    Start     Dose/Rate Route Frequency Ordered Stop   02/20/15 0845  ceFAZolin (ANCEF) 3 g in dextrose 5 % 50 mL IVPB     3 g 160 mL/hr over 30 Minutes Intravenous To [facility]Stay [facility] 02/17/15 1320 02/20/15 0925       Patient was given sequential compression devices, early ambulation, and chemoprophylaxis to prevent DVT.  Patient benefited maximally from hospital stay and there were no complications.    Recent vital signs: Patient Vitals for the past 24 hrs:  BP Temp Temp src Pulse Resp SpO2  02/22/15 0500 127/65 mmHg 98 F (36.7 C) Oral 94 20 100 %  02/21/15 2048 132/76 mmHg 98.7 F (37.1 C) Oral (!) 104 18 100 %  02/21/15 1249 (!) 149/84 mmHg 99.1 F (37.3 C) - 67 16 98 %     Recent laboratory studies:  Recent Labs  02/21/15 0520  02/22/15 0540  WBC 8.9 8.8  HGB 13.1 12.0*  HCT 40.2 35.5*  PLT 158 134*  NA 134*  --   K 3.8  --   CL 98*  --   CO2 26  --   BUN 15  --   CREATININE 1.57*  --   GLUCOSE 136*  --   CALCIUM 8.4*  --      Discharge Medications:     Medication List    TAKE these medications        amLODipine 10 MG tablet  Commonly known as:  NORVASC  Take 10 mg by mouth daily.     aspirin EC 325 MG tablet  Take 1 tablet (325 mg total) by mouth 2 (two) times daily.     diphenhydrAMINE 50 MG tablet  Commonly known as:  BENADRYL  Take 50 mg by mouth at bedtime as needed for sleep.     Fish Oil 1000 MG Caps  Take 1 capsule by mouth daily.     GINKOBA 40 MG Tabs  Generic drug:  Ginkgo Biloba  Take 1 tablet by mouth 3 (three) times a week.     halobetasol 0.05 % cream  Commonly known as:  ULTRAVATE  Apply 1 application topically 2 (two) times daily as needed (ITCHING/IRRITATION).     HYDROcodone-acetaminophen 10-325 MG tablet  Commonly known as:  NORCO  Take 1 tablet by mouth every 6 (six)  hours as needed for moderate pain.     ipratropium 0.03 % nasal spray  Commonly known as:  ATROVENT  Place 2 sprays into both nostrils 2 (two) times daily.     loratadine 10 MG tablet  Commonly known as:  CLARITIN  Take 10 mg by mouth daily as needed for allergies.     losartan 100 MG tablet  Commonly known as:  COZAAR  Take 100 mg by mouth daily.     meloxicam 15 MG tablet  Commonly known as:  MOBIC  Take 1 tablet (15 mg total) by mouth daily.     methocarbamol 500 MG tablet  Commonly known as:  ROBAXIN  Take 1 tablet (500 mg total) by mouth 2 (two) times daily with a meal.     multivitamin with minerals Tabs tablet  Take 1 tablet by mouth daily.     NON FORMULARY  Take 1,000 mg by mouth as needed (sexual enhancement). epimedium     NON FORMULARY  cpap     oxyCODONE-acetaminophen 5-325 MG tablet  Commonly known as:  ROXICET  Take 1 tablet by mouth every 4 (four) hours as  needed.     vitamin E 400 UNIT capsule  Generic drug:  vitamin E  Take 400 Units by mouth daily.     zolpidem 10 MG tablet  Commonly known as:  AMBIEN  Take 10 mg by mouth at bedtime as needed for sleep.        Diagnostic Studies: Dg Chest 2 View  02/09/2015  CLINICAL DATA:  Preop respiratory exam for total hip replacement. Hypertension. EXAM: CHEST  2 VIEW COMPARISON:  06/15/2010 FINDINGS: The heart size and mediastinal contours are within normal limits. Both lungs are clear. Azygos fissure again noted. No evidence of pleural effusion. The visualized skeletal structures are unremarkable. IMPRESSION: No active cardiopulmonary disease. Electronically Signed   By: Earle Gell M.D.   On: 02/09/2015 15:12   Dg Pelvis Portable  02/20/2015  CLINICAL DATA:  Status post left hip arthroplasty today. EXAM: PORTABLE PELVIS 1-2 VIEWS COMPARISON:  04/30/2013 FINDINGS: In the from projection the left hip arthroplasty shows normal alignment. The femoral stem is normally aligned. No evidence of fracture or abnormal lucency. The bony pelvis appears unremarkable. IMPRESSION: Normal alignment of left hip arthroplasty. Electronically Signed   By: Aletta Edouard M.D.   On: 02/20/2015 12:32    Disposition: Final discharge disposition not confirmed      Discharge Instructions    Call MD / Call 911    Complete by:  As directed   If you experience chest pain or shortness of breath, CALL 911 and be transported to the hospital emergency room.  If you develope a fever above 101 F, pus (white drainage) or increased drainage or redness at the wound, or calf pain, call your surgeon's office.     Change dressing    Complete by:  As directed   You may change your dressing on day 5, then change the dressing daily with sterile 4 x 4 inch gauze dressing and paper tape.  You may clean the incision with alcohol prior to redressing     Constipation Prevention    Complete by:  As directed   Drink plenty of fluids.  Prune  juice may be helpful.  You may use a stool softener, such as Colace (over the counter) 100 mg twice a day.  Use MiraLax (over the counter) for constipation as needed.     Diet -  low sodium heart healthy    Complete by:  As directed      Discharge instructions    Complete by:  As directed   Follow up in office with Dr. Mayer Camel in 2 weeks.     Driving restrictions    Complete by:  As directed   No driving for 2 weeks     Follow the hip precautions as taught in Physical Therapy    Complete by:  As directed      Increase activity slowly as tolerated    Complete by:  As directed      Patient may shower    Complete by:  As directed   You may shower without a dressing once there is no drainage.  Do not wash over the wound.  If drainage remains, cover wound with plastic wrap and then shower.           Follow-up Information    Follow up with Kerin Salen, MD In 2 weeks.   Specialty:  Orthopedic Surgery   Contact information:   [place] [address] [phone]        Signed: Theodosia Quay 02/22/2015, 7:36 AM

## 2015-02-22 NOTE — Progress Notes (Signed)
Physical Therapy Treatment Patient Details Name: Brent Mccoy MRN: [id number] DOB: [birth year] Today's Date: 02/22/2015    History of Present Illness 54 y.o. male s/p Lt posterior THA. PMH: HTN, sarcoidosis.    PT Comments    Pt is progressing well with mobility, he's ready to DC home from a PT standpoint. He demonstrates good understanding of posterior hip precautions.   Follow Up Recommendations  Home health PT;Supervision for mobility/OOB     Equipment Recommendations  Rolling walker with 5" wheels    Recommendations for Other Services       Precautions / Restrictions Precautions Precautions: Posterior Hip;Fall Precaution Booklet Issued: No Precaution Comments: pt able to state all precautions.  Restrictions Weight Bearing Restrictions: Yes LLE Weight Bearing: Weight bearing as tolerated    Mobility  Bed Mobility               General bed mobility comments: in chair on arrival  Transfers Overall transfer level: Needs assistance Equipment used: Rolling walker (2 wheeled) Transfers: Sit to/from Stand Sit to Stand: Supervision         General transfer comment: verbal cues for hand placement  Ambulation/Gait Ambulation/Gait assistance: Modified independent (Device/Increase time) Ambulation Distance (Feet): 300 Feet Assistive device: Rolling walker (2 wheeled)   Gait velocity: decreased   General Gait Details: good posture and position in RW, good sequencing, steady with RW   Stairs            Wheelchair Mobility    Modified Rankin (Stroke Patients Only)       Balance Overall balance assessment: Needs assistance Sitting-balance support: Feet supported Sitting balance-Leahy Scale: Good     Standing balance support: Single extremity supported Standing balance-Leahy Scale: Poor Standing balance comment: RW for support                    Cognition Arousal/Alertness: Awake/alert Behavior During Therapy: WFL for tasks  assessed/performed Overall Cognitive Status: Within Functional Limits for tasks assessed                      Exercises Total Joint Exercises Heel Slides: AAROM;Left;10 reps;Supine Hip ABduction/ADduction: AAROM;Seated;Left;15 reps;Standing;10 reps;AROM (supine x 15 AAROM, standing x 10 AROM) Long Arc Quad: AAROM;Seated;Left;15 reps Marching in Standing: AAROM;Left;15 reps;Standing (limited to 90degrees) Standing Hip Extension: AROM;Left;10 reps;Standing    General Comments        Pertinent Vitals/Pain Pain Assessment: 0-10 Pain Score: 6  Pain Location: L hip with walking Pain Descriptors / Indicators: Sore;Tightness Pain Intervention(s): Limited activity within patient's tolerance;Monitored during session;Premedicated before session;Ice applied    Home Living Family/patient expects to be discharged to:: Private residence Living Arrangements: Spouse/significant other;Children Available Help at Discharge: Family;Available 24 hours/day Type of Home: House Home Access: Stairs to enter Entrance Stairs-Rails: None Home Layout: One level Home Equipment: Cane - single point;Bedside commode;Shower seat - built in Additional Comments: Pt reports his bathroom is currently being remodeled, will be finished next weekend    Prior Function Level of Independence: Independent with assistive device(s) (occasional use of cane)          PT Goals (current goals can now be found in the care plan section) Acute Rehab PT Goals Patient 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, start a singing business PT Goal Formulation: With patient Time For Goal Achievement: 03/06/15 Potential to Achieve Goals: Good Progress towards PT goals: Progressing toward goals    Frequency  7X/week    PT Plan Current plan  remains appropriate    Co-evaluation             End of Session   Activity Tolerance: Patient tolerated treatment well Patient left: in chair;with call bell/phone within reach      Time: 0932-0958 PT Time Calculation (min) (ACUTE ONLY): 26 min  Charges:  $Gait Training: 8-22 mins $Therapeutic Exercise: 8-22 mins                    G Codes:      Philomena Doheny 02/22/2015, 10:16 AM [phone]

## 2015-02-22 NOTE — Evaluation (Signed)
Occupational Therapy Evaluation and Discharge Note Patient Details Name: Brent Mccoy MRN: [id number] DOB: Sep 21, 1960 Today's Date: 02/22/2015    History of Present Illness 54 y.o. male s/p Lt posterior THA. PMH: HTN, sarcoidosis.   Clinical Impression   Pt reports he was independent with ADLs PTA. Currently pt is overall min guard for ADLs and functional mobility with the exception of min A for LB ADLs. Pt able to recall 3/3 hip precautions. All education completed, pt verbalized understanding. Pt plan to d/c home with 24/7 supervision from his wife for the first week. Recommending HHOT to maximize independence and safety with ADLs and functional mobility required for safe d/c home. Pt with no further acute OT needs identified; pt reports no questions or concerns for OT at this time. Pt ready to d/c home from an OT standpoint, signing off at this time. Thank you for this referral.     Follow Up Recommendations  Home health OT;Supervision - Intermittent    Equipment Recommendations  None recommended by OT    Recommendations for Other Services       Precautions / Restrictions Precautions Precautions: Posterior Hip;Fall Precaution Booklet Issued: No Precaution Comments: Pt able to state 3/3 precuations.  Restrictions Weight Bearing Restrictions: Yes LLE Weight Bearing: Weight bearing as tolerated      Mobility Bed Mobility               General bed mobility comments: Pt in recliner, returned to recliner at end of session  Transfers Overall transfer level: Needs assistance Equipment used: Rolling walker (2 wheeled) Transfers: Sit to/from Stand Sit to Stand: Min guard         General transfer comment: Min guard for safety. Good hand placement and technique, no VC needed. Sit <> stand from chair x 1    Balance Overall balance assessment: Needs assistance Sitting-balance support: Feet supported Sitting balance-Leahy Scale: Good     Standing balance  support: Bilateral upper extremity supported Standing balance-Leahy Scale: Poor Standing balance comment: RW for support                            ADL Overall ADL's : Needs assistance/impaired Eating/Feeding: Set up;Sitting   Grooming: Min guard;Standing       Lower Body Bathing: Minimal assistance;Sit to/from stand       Lower Body Dressing: Minimal assistance;Sit to/from stand   Toilet Transfer: Min guard;Ambulation;BSC;RW (BSC over toilet)       Tub/ Shower Transfer: Min guard;Walk-in shower;Ambulation;Rolling walker   Functional mobility during ADLs: Min guard;Rolling walker General ADL Comments: No family present during OT eval. Educated pt on compensatory strategies for LB ADLs, use of 3 in 1 over toilet, walk in shower transfer technique, edema management techniques; pt verbalized understanding. Provided pt with walk in shower transfer handout. Educated pt on use of AE for increased independence with LB ADLs, pt declined stating his wife could help as needed. Reviewed hip precuations during functional activities; pt demonstrated understanding.     Vision     Perception     Praxis      Pertinent Vitals/Pain Pain Assessment: 0-10 Pain Score: 3  Pain Location: L hip Pain Descriptors / Indicators: Aching;Sore Pain Intervention(s): Limited activity within patient's tolerance;Monitored during session;Repositioned;Ice applied     Hand Dominance     Extremity/Trunk Assessment Upper Extremity Assessment Upper Extremity Assessment: Overall WFL for tasks assessed   Lower Extremity Assessment Lower Extremity Assessment: Defer to  PT evaluation   Cervical / Trunk Assessment Cervical / Trunk Assessment: Normal   Communication Communication Communication: No difficulties   Cognition Arousal/Alertness: Awake/alert Behavior During Therapy: WFL for tasks assessed/performed Overall Cognitive Status: Within Functional Limits for tasks assessed                      General Comments       Exercises       Shoulder Instructions      Home Living Family/patient expects to be discharged to:: Private residence Living Arrangements: Spouse/significant other;Children Available Help at Discharge: Family;Available 24 hours/day Type of Home: House Home Access: Stairs to enter [employer] of Steps: 2 Entrance Stairs-Rails: None Home Layout: One level     Bathroom Shower/Tub: Occupational psychologist: Handicapped height Bathroom Accessibility: Yes How Accessible: Accessible via walker Home Equipment: [place] - single point;Bedside commode;Shower seat - built in   Additional Comments: Pt reports his bathroom is currently being remodeled, will be finished next weekend      Prior Functioning/Environment Level of Independence: Independent with assistive device(s) (occasional use of cane)             OT Diagnosis: Acute pain   OT Problem List:     OT Treatment/Interventions:      OT Goals(Current goals can be found in the care plan section) Acute Rehab OT Goals Patient 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 OT Goal Formulation: With patient  OT Frequency:     Barriers to D/C:            Co-evaluation              End of Session Equipment Utilized During Treatment: Gait belt;Rolling walker  Activity Tolerance: Patient tolerated treatment well Patient left: in chair;with call bell/phone within reach   Time: 0821-0839 OT Time Calculation (min): 18 min Charges:  OT General Charges $OT Visit: 1 Procedure OT Evaluation $Initial OT Evaluation Tier I: 1 Procedure G-Codes:     Binnie Kand M.S., OTR/L Pager: [phone]  02/22/2015, 8:49 AM

## 2016-04-23 ENCOUNTER — Encounter: Payer: Self-pay | Admitting: Internal Medicine

## 2016-04-23 ENCOUNTER — Ambulatory Visit (INDEPENDENT_AMBULATORY_CARE_PROVIDER_SITE_OTHER)
Admission: RE | Admit: 2016-04-23 | Discharge: 2016-04-23 | Disposition: A | Discharge status: Home / Self Care | Payer: 59 | Source: Ambulatory Visit | Attending: Internal Medicine | Admitting: Internal Medicine

## 2016-04-23 ENCOUNTER — Other Ambulatory Visit (INDEPENDENT_AMBULATORY_CARE_PROVIDER_SITE_OTHER): Payer: 59

## 2016-04-23 ENCOUNTER — Ambulatory Visit (INDEPENDENT_AMBULATORY_CARE_PROVIDER_SITE_OTHER): Payer: 59 | Admitting: Internal Medicine

## 2016-04-23 VITALS — BP 132/76 | HR 75 | Ht 75.0 in | Wt >= 6400 oz

## 2016-04-23 DIAGNOSIS — R059 Cough, unspecified: Secondary | ICD-10-CM

## 2016-04-23 DIAGNOSIS — R05 Cough: Secondary | ICD-10-CM

## 2016-04-23 DIAGNOSIS — D869 Sarcoidosis, unspecified: Secondary | ICD-10-CM

## 2016-04-23 LAB — CBC WITH DIFFERENTIAL/PLATELET
Basophils Absolute: 0 10*3/uL (ref 0.0–0.1)
Basophils Relative: 0.2 % (ref 0.0–3.0)
EOS ABS: 0.2 10*3/uL (ref 0.0–0.7)
Eosinophils Relative: 3.4 % (ref 0.0–5.0)
HCT: 43.3 % (ref 39.0–52.0)
Hemoglobin: 14.9 g/dL (ref 13.0–17.0)
LYMPHS ABS: 1.2 10*3/uL (ref 0.7–4.0)
Lymphocytes Relative: 24 % (ref 12.0–46.0)
MCHC: 34.3 g/dL (ref 30.0–36.0)
MCV: 88.6 fl (ref 78.0–100.0)
MONO ABS: 0.5 10*3/uL (ref 0.1–1.0)
MONOS PCT: 9.9 % (ref 3.0–12.0)
Neutro Abs: 3.2 10*3/uL (ref 1.4–7.7)
Neutrophils Relative %: 62.5 % (ref 43.0–77.0)
PLATELETS: 176 10*3/uL (ref 150.0–400.0)
RBC: 4.89 Mil/uL (ref 4.22–5.81)
RDW: 14.2 % (ref 11.5–15.5)
WBC: 5.2 10*3/uL (ref 4.0–10.5)

## 2016-04-23 MED ORDER — TRAMADOL HCL 50 MG PO TABS: ORAL_TABLET | ORAL | 0 refills | Status: DC

## 2016-04-23 NOTE — Patient Instructions (Signed)
Order-  CXR    Dx Sarcoid, cough  Order- lab- ACE level   Dx Sarcoid, CBC w diff  Script for tramadol to try for cough- may also help with arthritis pain

## 2016-04-23 NOTE — Progress Notes (Signed)
HPI M never smoker previously followed  for sarcoid, insomnia, OSA/ CPAP, complicated  by HBP, allergic rhinitis, morbid obesity  --------------------------------------------------------------------------------------------  04/11/11- 50 yom never smoker followed for hx sarcoid, insomnia complicated by HBP, allergic rhinitis. LOV-06/15/10  Feels fine. Declines flu vax. Saw dermatology for rash "not sarcoid". Denies chest symptoms.  Insomnia- failed [place]. Prefers trazodone. CXR 06/15/10- stable hilar fullness, shallow inspiration. Images reviewed w/ him.  04/23/2016- 80 year old man previously followed here for sarcoid, insomnia, OSA/ CPAP, complicated  by HBP, allergic rhinitis.  Former patient-reestablishing FOLLOWS FOR: Pt last seen in 2012, Pt stated that he has been diagnosed and treated for sleep apnea and is here due to sarcoid follow up . Pt states he now has a nonproductive cough for 2 months and is unsure what is causing it.  CXR 02/09/2015-[facility]D PCP and treated for URI/cough. Eventually got antibiotic but cough lingers-dry. Rash on back/hives-helped by Benadryl  ROV-see HPI    + = pos Constitutional:   No-   weight loss, night sweats, fevers, chills, fatigue, lassitude. HEENT:   No-  headaches, difficulty swallowing, tooth/dental problems, sore throat,       No-  sneezing, itching, ear ache, nasal congestion, post nasal drip,  CV:  No-   chest pain, orthopnea, PND, swelling in lower extremities, anasarca, dizziness, palpitations Resp: No-   shortness of breath with exertion or at rest.              No-   productive cough,  + non-productive cough,  No- coughing up of blood.              No-   change in color of mucus.  No- wheezing.   Skin: No-   rash or lesions. GI:  No-   heartburn, indigestion, abdominal pain, nausea, vomiting, diarrhea,                 change in bowel habits, loss of appetite GU: No-   dysuria, change in color of urine, no urgency or frequency.  No-  flank pain. MS:  No-   joint pain or swelling.  No- decreased range of motion.  No- back pain. Neuro-     nothing unusual Psych:  No- change in mood or affect. No depression or anxiety.  No memory loss.  OBJ General- Alert, Oriented, Affect-appropriate, Distress- none acute, +morbidly obese/ big, laconic Skin- rash-hyperpigmented area persists left lateral calf. , lesions- none, excoriation- none Lymphadenopathy- none Head- atraumatic            Eyes- Gross vision intact, PERRLA, conjunctivae clear secretions            Ears- Hearing, canals-normal            Nose- Clear, no-Septal dev, mucus, polyps, erosion, perforation             Throat- Mallampati II , mucosa clear , drainage- none, tonsils- atrophic Neck- flexible , trachea midline, no stridor , thyroid nl, carotid no bruit Chest - symmetrical excursion , unlabored           Heart/CV- RRR , no murmur , no gallop  , no rub, nl s1 s2                           - JVD- none , edema- none, stasis changes- none, varices- none           Lung- clear to P&A, wheeze- none, cough- none ,  dullness-none, rub- none           Chest wall-  Abd- tender-no, distended-no, bowel sounds-present, HSM- no Br/ Gen/ Rectal- Not done, not indicated Extrem- cyanosis- none, clubbing, none, atrophy- none, strength- nl Neuro- grossly intact to observation

## 2016-04-24 LAB — ANGIOTENSIN CONVERTING ENZYME: ANGIOTENSIN-CONVERTING ENZYME: 68 U/L — AB (ref 9–67)

## 2016-04-25 ENCOUNTER — Telehealth: Payer: Self-pay | Admitting: Internal Medicine

## 2016-04-25 NOTE — Telephone Encounter (Signed)
Notes Recorded by Deneise Lever, MD on 04/23/2016 at 3:39 PM EST CXR- some old scarring in the bases might be left over from old sarcoid. No new or active problem is seen. ------------------------------------- Spoke with pt. He is aware of results. Nothing further was needed.

## 2016-04-25 NOTE — Telephone Encounter (Signed)
[phone] pt calling back

## 2016-05-01 DIAGNOSIS — R05 Cough: Secondary | ICD-10-CM | POA: Diagnosis not present

## 2016-06-01 DIAGNOSIS — Z23 Encounter for immunization: Secondary | ICD-10-CM | POA: Diagnosis not present

## 2016-06-24 ENCOUNTER — Encounter: Payer: Self-pay | Admitting: Internal Medicine

## 2016-06-24 ENCOUNTER — Ambulatory Visit (INDEPENDENT_AMBULATORY_CARE_PROVIDER_SITE_OTHER): Payer: 59 | Admitting: Internal Medicine

## 2016-06-24 VITALS — BP 130/78 | HR 101 | Ht 75.0 in | Wt >= 6400 oz

## 2016-06-24 DIAGNOSIS — G4733 Obstructive sleep apnea (adult) (pediatric): Secondary | ICD-10-CM | POA: Diagnosis not present

## 2016-06-24 DIAGNOSIS — D869 Sarcoidosis, unspecified: Secondary | ICD-10-CM

## 2016-06-24 NOTE — Patient Instructions (Signed)
Print script/ Fax copy --- [facility]  Replacement CPAP mask of choice, humidifier, supplies, AirView  Dx OSA

## 2016-06-24 NOTE — Assessment & Plan Note (Signed)
Most likely this cough is a post viral bronchitis/tracheobronchitis but his concern is possibility of reactivation of sarcoid. Plan-CXR, ACE level, tramadol for cough

## 2016-06-24 NOTE — Progress Notes (Signed)
HPI M never smoker previously followed  for sarcoid, insomnia, OSA/ CPAP, complicated  by HBP, allergic rhinitis, morbid obesity NPSG [facility] 05/04/14 AHI 61.2/ hr, desaturation to 81%, CPAP to 11, body weight 422 lbs ACE 04/23/16- 68 ( 9-67) CBC with differential 04/23/2016-WNL --------------------------------------------------------------------------------------------  04/23/2016- 56 year old M never smoker previously followed here for sarcoid, insomnia, OSA/ CPAP, complicated  by HBP, allergic rhinitis.  Former patient-reestablishing FOLLOWS FOR: Pt last seen in 2012, Pt stated that he has been diagnosed and treated for sleep apnea and is here due to sarcoid follow up . Pt states he now has a nonproductive cough for 2 months and is unsure what is causing it.  CXR 02/09/2015-NAD PCP and treated for URI/cough. Eventually got antibiotic but cough lingers-dry. Rash on back/hives-helped by Benadryl  06/24/2016-18 year old M never smoker followed for Sarcoid, Insomnia, OSA/CPAP, complicated by HBP, allergic rhinitis CPAP ? 12 /[facility] Pt would like [place] to take over his sleep and CPAP needs. DME is [facility] [place] office. Pt had sleep study about 3-4 years ago. He needs CPAP mask and supplies replaced. His job requires that he climb 18 steps several times a day. Taking ibuprofen for his knees.  ACE 04/23/16- 68 ( 9-67) CBC with differential 04/23/2016-WNL CXR 04/23/2016 IMPRESSION: No evidence of pneumonia nor other active cardiopulmonary disease. Chronic interstitial prominence especially in the lower lobes may reflect the patient's known sarcoidosis.  ROV-see HPI    + = pos Constitutional:   No-   weight loss, night sweats, fevers, chills, fatigue, lassitude. HEENT:   No-  headaches, difficulty swallowing, tooth/dental problems, sore throat,       No-  sneezing, itching, ear ache, nasal congestion, post nasal drip,  CV:  No-   chest pain, orthopnea, PND, swelling in lower extremities,  anasarca, dizziness, palpitations Resp: + shortness of breath with exertion or at rest.              No-   productive cough,  + non-productive cough,  No- coughing up of blood.              No-   change in color of mucus.  No- wheezing.   Skin: No-   rash or lesions. GI:  No-   heartburn, indigestion, abdominal pain, nausea, vomiting, diarrhea,                 change in bowel habits, loss of appetite GU: No-   dysuria, change in color of urine, no urgency or frequency.  No- flank pain. MS:  + joint pain or swelling.  No- decreased range of motion.  No- back pain. Neuro-     nothing unusual Psych:  No- change in mood or affect. No depression or anxiety.  No memory loss.  OBJ General- Alert, Oriented, Affect-appropriate, Distress- none acute, +morbidly obese/ big, laconic Skin- rash-hyperpigmented area persists left lateral calf. , lesions- none, excoriation- none Lymphadenopathy- none Head- atraumatic            Eyes- Gross vision intact, PERRLA, conjunctivae clear secretions            Ears- Hearing, canals-normal            Nose- Clear, no-Septal dev, mucus, polyps, erosion, perforation             Throat- Mallampati II , mucosa clear , drainage- none, tonsils- atrophic Neck- flexible , trachea midline, no stridor , thyroid nl, carotid no bruit Chest - symmetrical excursion , unlabored  Heart/CV- RRR , no murmur , no gallop  , no rub, nl s1 s2                           - JVD- none , edema- none, stasis changes- none, varices- none           Lung- clear to P&A, wheeze- none, cough- none , dullness-none, rub- none           Chest wall-  Abd-  Br/ Gen/ Rectal- Not done, not indicated Extrem- cyanosis- none, clubbing, none, atrophy- none, strength- nl Neuro- grossly intact to observation

## 2016-06-30 DIAGNOSIS — G4733 Obstructive sleep apnea (adult) (pediatric): Secondary | ICD-10-CM | POA: Insufficient documentation

## 2016-06-30 NOTE — Assessment & Plan Note (Signed)
At his request we will help him with management of CPAP. Plan-DME[facility] contacted for replacement mask and supplies. We also need a download

## 2016-06-30 NOTE — Assessment & Plan Note (Addendum)
ACE at the upper limit of normal/borderline increased, but he does not have cough, rash, adenopathy to suggest active systemic sarcoid at this time.

## 2016-07-12 DIAGNOSIS — L989 Disorder of the skin and subcutaneous tissue, unspecified: Secondary | ICD-10-CM | POA: Diagnosis not present

## 2016-07-12 DIAGNOSIS — G4700 Insomnia, unspecified: Secondary | ICD-10-CM | POA: Diagnosis not present

## 2016-07-12 DIAGNOSIS — Z125 Encounter for screening for malignant neoplasm of prostate: Secondary | ICD-10-CM | POA: Diagnosis not present

## 2016-07-12 DIAGNOSIS — I10 Essential (primary) hypertension: Secondary | ICD-10-CM | POA: Diagnosis not present

## 2016-08-09 DIAGNOSIS — G4733 Obstructive sleep apnea (adult) (pediatric): Secondary | ICD-10-CM | POA: Diagnosis not present

## 2016-11-07 DIAGNOSIS — I10 Essential (primary) hypertension: Secondary | ICD-10-CM | POA: Diagnosis not present

## 2016-11-07 DIAGNOSIS — M545 Low back pain: Secondary | ICD-10-CM | POA: Diagnosis not present

## 2016-11-07 DIAGNOSIS — K625 Hemorrhage of anus and rectum: Secondary | ICD-10-CM | POA: Diagnosis not present

## 2017-03-20 DIAGNOSIS — M25552 Pain in left hip: Secondary | ICD-10-CM | POA: Diagnosis not present

## 2017-03-20 DIAGNOSIS — M1711 Unilateral primary osteoarthritis, right knee: Secondary | ICD-10-CM | POA: Diagnosis not present

## 2017-03-20 DIAGNOSIS — Z96642 Presence of left artificial hip joint: Secondary | ICD-10-CM | POA: Diagnosis not present

## 2017-03-24 DIAGNOSIS — Z0100 Encounter for examination of eyes and vision without abnormal findings: Secondary | ICD-10-CM | POA: Diagnosis not present

## 2017-04-07 DIAGNOSIS — G4700 Insomnia, unspecified: Secondary | ICD-10-CM | POA: Diagnosis not present

## 2017-04-07 DIAGNOSIS — I10 Essential (primary) hypertension: Secondary | ICD-10-CM | POA: Diagnosis not present

## 2017-04-07 DIAGNOSIS — Z23 Encounter for immunization: Secondary | ICD-10-CM | POA: Diagnosis not present

## 2017-06-24 ENCOUNTER — Encounter: Payer: Self-pay | Admitting: Internal Medicine

## 2017-06-24 ENCOUNTER — Ambulatory Visit: Payer: 59 | Admitting: Internal Medicine

## 2017-06-24 VITALS — BP 122/80 | HR 84 | Ht 75.0 in | Wt >= 6400 oz

## 2017-06-24 DIAGNOSIS — G4733 Obstructive sleep apnea (adult) (pediatric): Secondary | ICD-10-CM | POA: Diagnosis not present

## 2017-06-24 DIAGNOSIS — D869 Sarcoidosis, unspecified: Secondary | ICD-10-CM

## 2017-06-24 NOTE — Progress Notes (Signed)
HPI M never smoker previously followed  for sarcoid, insomnia, OSA/ CPAP, complicated  by HBP, allergic rhinitis, morbid obesity NPSG [facility] 05/04/14 AHI 61.2/ hr, desaturation to 81%, CPAP to 11, body weight 422 lbs ACE 04/23/16- 68 ( 9-67) CBC with differential 04/23/2016-WNL --------------------------------------------------------------------------------------------  06/24/2016-57 year old M never smoker followed for Sarcoid, Insomnia, OSA/CPAP, complicated by HBP, allergic rhinitis CPAP ? 12 /[facility] Pt would like [place] to take over his sleep and CPAP needs. DME is [facility] [place] office. Pt had sleep study about 3-4 years ago. He needs CPAP mask and supplies replaced. His job requires that he climb 18 steps several times a day. Taking ibuprofen for his knees.  ACE 04/23/16- 68 ( 9-67) CBC with differential 04/23/2016-WNL CXR 04/23/2016 IMPRESSION: No evidence of pneumonia nor other active cardiopulmonary disease. Chronic interstitial prominence especially in the lower lobes may reflect the patient's known sarcoidosis.  06/24/17- 65 year old M never smoker followed for Sarcoid, Insomnia, OSA/CPAP,  complicated by HBP, allergic rhinitis, GERD,  CPAP ? 12 /[facility] ----OSA; DME: [facility]. Pt wears CPAP machine every night for about 7 hours. Will need to order DL as patient did not bring machine or SD card to visit. Pt will need order for new supplies as well. Says he is "hooked on" his CPAP machine and cannot sleep well without it. Denies problems with his heart, breathing or diabetes despite his weight.  Line he had questions about how to recognize reactivation of sarcoid but is without concerning symptoms.  ROV-see HPI    + = pos Constitutional:   No-   weight loss, night sweats, fevers, chills, fatigue, lassitude. HEENT:   No-  headaches, difficulty swallowing, tooth/dental problems, sore throat,       No-  sneezing, itching, ear ache, nasal congestion, post nasal drip,  CV:  No-    chest pain, orthopnea, PND, swelling in lower extremities, anasarca, dizziness, palpitations Resp: + shortness of breath with exertion or at rest.              No-   productive cough,   non-productive cough,  No- coughing up of blood.              No-   change in color of mucus.  No- wheezing.   Skin: No-   rash or lesions. GI:  No-   heartburn, indigestion, abdominal pain, nausea, vomiting, diarrhea,                 change in bowel habits, loss of appetite GU: No-   dysuria, change in color of urine, no urgency or frequency.  No- flank pain. MS:  + joint pain or swelling.  No- decreased range of motion.  No- back pain. Neuro-     nothing unusual Psych:  No- change in mood or affect. No depression or anxiety.  No memory loss.  OBJ General- Alert, Oriented, Affect-appropriate, Distress- none acute, +morbidly obese/ big, laconic Skin- rash-hyperpigmented area persists left lateral calf. , lesions- none, excoriation- none Lymphadenopathy- none Head- atraumatic            Eyes- Gross vision intact, PERRLA, conjunctivae clear secretions            Ears- Hearing, canals-normal            Nose- Clear, no-Septal dev, mucus, polyps, erosion, perforation             Throat- Mallampati II , mucosa clear , drainage- none, tonsils- atrophic Neck- flexible , trachea midline, no  stridor , thyroid nl, carotid no bruit Chest - symmetrical excursion , unlabored           Heart/CV- RRR , no murmur , no gallop  , no rub, nl s1 s2                           - JVD- none , edema- none, stasis changes- none, varices- none           Lung- clear to P&A, wheeze- none, cough- none , dullness-none, rub- none           Chest wall-  Abd-  Br/ Gen/ Rectal- Not done, not indicated Extrem- cyanosis- none, clubbing, none, atrophy- none, strength- nl Neuro- grossly intact to observation

## 2017-06-24 NOTE — Patient Instructions (Addendum)
Order- [facility]- Continue CPAP 12, mask of choice, humidifier, supplies, AirView  Dx OSA  Please call if we can help   My staff will order a download so we can see how your machine is holding up.

## 2017-06-25 NOTE — Assessment & Plan Note (Signed)
No recurrence evident at this time and I told him it would be very unlikely for him to have reactivation of systemic sarcoid.

## 2017-06-25 NOTE — Assessment & Plan Note (Signed)
He continues to benefit from CPAP.  We will need to request a download.  Machine is not yet old enough to qualify for replacement but when we can, I will change him to AutoP[facility].

## 2017-06-25 NOTE — Assessment & Plan Note (Signed)
Seriously overweight.  Encouraged bariatric evaluation.

## 2017-11-04 DIAGNOSIS — J309 Allergic rhinitis, unspecified: Secondary | ICD-10-CM | POA: Diagnosis not present

## 2017-11-04 DIAGNOSIS — Z125 Encounter for screening for malignant neoplasm of prostate: Secondary | ICD-10-CM | POA: Diagnosis not present

## 2017-11-04 DIAGNOSIS — G4700 Insomnia, unspecified: Secondary | ICD-10-CM | POA: Diagnosis not present

## 2017-11-04 DIAGNOSIS — Z0000 Encounter for general adult medical examination without abnormal findings: Secondary | ICD-10-CM | POA: Diagnosis not present

## 2017-11-04 DIAGNOSIS — I10 Essential (primary) hypertension: Secondary | ICD-10-CM | POA: Diagnosis not present

## 2017-11-05 DIAGNOSIS — I10 Essential (primary) hypertension: Secondary | ICD-10-CM | POA: Diagnosis not present

## 2017-11-05 DIAGNOSIS — Z125 Encounter for screening for malignant neoplasm of prostate: Secondary | ICD-10-CM | POA: Diagnosis not present

## 2017-12-22 ENCOUNTER — Other Ambulatory Visit: Payer: Self-pay

## 2017-12-22 ENCOUNTER — Emergency Department (HOSPITAL_COMMUNITY): Payer: 59

## 2017-12-22 ENCOUNTER — Emergency Department (HOSPITAL_BASED_OUTPATIENT_CLINIC_OR_DEPARTMENT_OTHER): Payer: 59

## 2017-12-22 ENCOUNTER — Encounter (HOSPITAL_COMMUNITY): Payer: Self-pay | Admitting: Emergency Medicine

## 2017-12-22 ENCOUNTER — Ambulatory Visit (HOSPITAL_COMMUNITY)
Admission: EM | Admit: 2017-12-22 | Discharge: 2017-12-22 | Disposition: A | Discharge status: Home / Self Care | Payer: 59 | Source: Home / Self Care

## 2017-12-22 ENCOUNTER — Emergency Department (HOSPITAL_COMMUNITY)
Admission: EM | Admit: 2017-12-22 | Discharge: 2017-12-22 | Disposition: A | Discharge status: Home / Self Care | Payer: 59 | Attending: Emergency Medicine | Admitting: Emergency Medicine

## 2017-12-22 DIAGNOSIS — Z79899 Other long term (current) drug therapy: Secondary | ICD-10-CM | POA: Insufficient documentation

## 2017-12-22 DIAGNOSIS — I824Z2 Acute embolism and thrombosis of unspecified deep veins of left distal lower extremity: Secondary | ICD-10-CM | POA: Insufficient documentation

## 2017-12-22 DIAGNOSIS — M7989 Other specified soft tissue disorders: Secondary | ICD-10-CM

## 2017-12-22 DIAGNOSIS — I82409 Acute embolism and thrombosis of unspecified deep veins of unspecified lower extremity: Secondary | ICD-10-CM | POA: Diagnosis not present

## 2017-12-22 DIAGNOSIS — R0609 Other forms of dyspnea: Secondary | ICD-10-CM | POA: Diagnosis not present

## 2017-12-22 DIAGNOSIS — I10 Essential (primary) hypertension: Secondary | ICD-10-CM | POA: Insufficient documentation

## 2017-12-22 DIAGNOSIS — R9431 Abnormal electrocardiogram [ECG] [EKG]: Secondary | ICD-10-CM | POA: Diagnosis not present

## 2017-12-22 DIAGNOSIS — I82492 Acute embolism and thrombosis of other specified deep vein of left lower extremity: Secondary | ICD-10-CM

## 2017-12-22 DIAGNOSIS — M79662 Pain in left lower leg: Secondary | ICD-10-CM | POA: Diagnosis present

## 2017-12-22 LAB — BASIC METABOLIC PANEL
Anion gap: 9 (ref 5–15)
BUN: 18 mg/dL (ref 6–20)
CO2: 28 mmol/L (ref 22–32)
Calcium: 9 mg/dL (ref 8.9–10.3)
Chloride: 103 mmol/L (ref 98–111)
Creatinine, Ser: 1.42 mg/dL — ABNORMAL HIGH (ref 0.61–1.24)
GFR calc Af Amer: >60 mL/min (ref >60)
GFR calc non Af Amer: 53 mL/min — ABNORMAL LOW (ref >60)
Glucose, Bld: 97 mg/dL (ref 70–99)
Potassium: 4.1 mmol/L (ref 3.5–5.1)
Sodium: 140 mmol/L (ref 135–145)

## 2017-12-22 LAB — CBC WITH DIFFERENTIAL/PLATELET
Abs Immature Granulocytes: 0 10*3/uL (ref 0.0–0.1)
Basophils Absolute: 0 10*3/uL (ref 0.0–0.1)
Basophils Relative: 0 %
Eosinophils Absolute: 0.2 10*3/uL (ref 0.0–0.7)
Eosinophils Relative: 3 %
HCT: 43.2 % (ref 39.0–52.0)
Hemoglobin: 13.9 g/dL (ref 13.0–17.0)
Immature Granulocytes: 1 %
Lymphocytes Relative: 28 %
Lymphs Abs: 1.5 10*3/uL (ref 0.7–4.0)
MCH: 29.5 pg (ref 26.0–34.0)
MCHC: 32.2 g/dL (ref 30.0–36.0)
MCV: 91.7 fL (ref 78.0–100.0)
Monocytes Absolute: 0.6 10*3/uL (ref 0.1–1.0)
Monocytes Relative: 11 %
Neutro Abs: 3 10*3/uL (ref 1.7–7.7)
Neutrophils Relative %: 57 %
Platelets: 153 10*3/uL (ref 150–400)
RBC: 4.71 MIL/uL (ref 4.22–5.81)
RDW: 12.9 % (ref 11.5–15.5)
WBC: 5.3 10*3/uL (ref 4.0–10.5)

## 2017-12-22 MED ORDER — SODIUM CHLORIDE 0.9 % IV BOLUS
500.0000 mL | Freq: Once | INTRAVENOUS | Status: AC
  Administered 2017-12-22: 500 mL via INTRAVENOUS

## 2017-12-22 MED ORDER — ELIQUIS 5 MG VTE STARTER PACK: ORAL_TABLET | ORAL | 0 refills | Status: DC

## 2017-12-22 MED ORDER — APIXABAN 5 MG PO TABS
10.0000 mg | ORAL_TABLET | Freq: Once | ORAL | Status: AC
  Administered 2017-12-22: 10 mg via ORAL
  Filled 2017-12-22: qty 2

## 2017-12-22 MED ORDER — APIXABAN (ELIQUIS) EDUCATION KIT FOR DVT/PE PATIENTS
PACK | Freq: Once | Status: DC
  Filled 2017-12-22 (×2): qty 1

## 2017-12-22 MED ORDER — IOPAMIDOL (ISOVUE-370) INJECTION 76%
100.0000 mL | Freq: Once | INTRAVENOUS | Status: AC | PRN
  Administered 2017-12-22: 100 mL via INTRAVENOUS

## 2017-12-22 MED ORDER — IOPAMIDOL (ISOVUE-370) INJECTION 76%
INTRAVENOUS | Status: AC
  Filled 2017-12-22: qty 100

## 2017-12-22 NOTE — ED Provider Notes (Signed)
[facility] EMERGENCY DEPARTMENT Provider Note   CSN: [id number] Arrival date & time: 12/22/17  1533     History   Chief Complaint Chief Complaint  Patient presents with  . Calf Pain    HPI Brent Mccoy is a 57 y.o. male with a past medical history of sarcoidosis, morbid obesity, who presents today for evaluation of left leg pain and cramping for approximately 1 week.  He reports that he has been having cramps in that leg like he has never had before and feels a lump in his left calf.  He went to the urgent care this morning and was advised to come to the ER for DVT study.  He does not have any history of DVT, denies any trauma to this area.  He does also report that he has had decreased exercise tolerance over the past week.  He says that he does not feel short of breath at baseline, however he is unable to perform exercises without becoming winded that he would normally be able to perform without difficulty.  He does not have any personal history of blood clots.  HPI  Past Medical History:  Diagnosis Date  . Allergic rhinitis   . DJD (degenerative joint disease)   . GERD (gastroesophageal reflux disease)    hx of - not current  . Hypertension    takes Losartan and Amlodipine daily  . Insomnia    takes Ambien nightly as needed  . Sarcoidosis   . Sleep apnea     Patient Active Problem List   Diagnosis Date Noted  . Obesity, morbid ([place]) 06/25/2017  . Obstructive sleep apnea 06/30/2016  . Primary osteoarthritis of left hip 02/20/2015  . Arthritis, hip 02/20/2015  . HYPERTENSION 06/15/2010  . ALLERGIC RHINITIS 06/15/2010  . INSOMNIA 03/28/2008  . Sarcoidosis ([place]) 01/07/2008    Past Surgical History:  Procedure Laterality Date  . COLONOSCOPY    . 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 TOTAL HIP ARTHROPLASTY Left 02/20/2015   Procedure: TOTAL HIP ARTHROPLASTY; LEFT;  Surgeon: Frederik Pear, MD;  Location: [place];  Service: Orthopedics;   Laterality: Left;  Marland Kitchen VASECTOMY          Home Medications    Prior to Admission medications   Medication Sig Start Date End Date Taking? Authorizing Provider  amLODipine (NORVASC) 10 MG tablet Take 10 mg by mouth daily.      [provider]  diphenhydrAMINE (BENADRYL) 50 MG tablet Take 50 mg by mouth at bedtime as needed for sleep.    [provider]  ELIQUIS STARTER PACK (ELIQUIS STARTER PACK) 5 MG TABS Take as directed on package: start with two-44m tablets twice daily for 7 days. On day 8, switch to one-570mtablet twice daily. 12/22/17   HaLorin GlassPA-C  Ginkgo Biloba ([facility] MG TABS Take 1 tablet by mouth 3 (three) times a week.     [provider]  loratadine (CLARITIN) 10 MG tablet Take 10 mg by mouth daily as needed for allergies.    [provider]  losartan (COZAAR) 100 MG tablet Take 100 mg by mouth daily.    [provider]  NON FORMULARY Take 1,000 mg by mouth as needed (sexual enhancement). epimedium    [provider]  NON FORMULARY cpap    [provider]  Omega-3 Fatty Acids (FISH OIL) 1000 MG CAPS Take 1 capsule by mouth daily.  [provider]  traMADol Veatrice Bourbon) 50 MG tablet 1-2 tabs every 8 hours if needed for cough 04/23/16   Baird Lyons D, MD  vitamin E (VITAMIN E) 400 UNIT capsule Take 400 Units by mouth daily.      [provider]  zolpidem (AMBIEN) 10 MG tablet Take 10 mg by mouth at bedtime as needed for sleep.    [provider]    Family History Family History  Problem Relation Age of Onset  . Rectal cancer Father     Social History Social History   Tobacco Use  . Smoking status: Never Smoker  . Smokeless tobacco: Never Used  Substance Use Topics  . Alcohol use: No  . Drug use: No     Allergies   Patient has no known allergies.   Review of Systems Review of Systems  Constitutional: Negative for chills and fever.  Eyes: Negative for  visual disturbance.  Respiratory: Negative for cough and chest tightness.        Decreased exercise tolerance  Cardiovascular: Negative for chest pain, palpitations and leg swelling.       Pain in left calf, left thigh  Gastrointestinal: Negative for abdominal pain, nausea and vomiting.  Skin: Negative for color change and wound.  Neurological: Negative for dizziness, weakness and headaches.  All other systems reviewed and are negative.    Physical Exam Updated Vital Signs BP (!) 196/111 (BP Location: Right Wrist)   Pulse 75   Temp 98.7 F (37.1 C) (Oral)   Resp 18   SpO2 96%   Physical Exam  Constitutional: He appears well-developed and well-nourished.  HENT:  Head: Normocephalic and atraumatic.  Mouth/Throat: Oropharynx is clear and moist.  Eyes: Conjunctivae are normal.  Neck: Normal range of motion. Neck supple.  Cardiovascular: Normal rate, regular rhythm, normal heart sounds and intact distal pulses.  No murmur heard. Pulmonary/Chest: Effort normal and breath sounds normal. No stridor. No respiratory distress. He has no wheezes. He exhibits no tenderness.  Abdominal: Soft. Bowel sounds are normal. He exhibits no distension. There is no tenderness. There is no guarding.  Musculoskeletal: He exhibits no edema.  Exam limited secondary to body habitus.  No obvious swelling of left calf.  There is a palpable lump on the left mid medial calf that appears consistent with a superficial varicose vein.  Neurological: He is alert.  Skin: Skin is warm and dry.  Psychiatric: He has a normal mood and affect.  Nursing note and vitals reviewed.    ED Treatments / Results  Labs (all labs ordered are listed, but only abnormal results are displayed) Labs Reviewed  BASIC METABOLIC PANEL - Abnormal; Notable for the following components:      Result Value   Creatinine, Ser 1.42 (*)    GFR calc non Af Amer 53 (*)    All other components within normal limits  CBC WITH  DIFFERENTIAL/PLATELET    EKG EKG Interpretation  Date/Time:  Monday December 22 2017 17:44:03 EDT Ventricular Rate:  74 PR Interval:  170 QRS Duration: 96 QT Interval:  364 QTC Calculation: 404 R Axis:   -3 Text Interpretation:  Normal sinus rhythm Nonspecific T wave abnormality Abnormal ECG similar to 2016 Confirmed by Sherwood Gambler [phone]) on 12/22/2017 6:54:58 PM   Radiology Ct Angio Chest Pe W/cm &/or Wo Cm  Result Date: 12/22/2017 CLINICAL DATA:  DVT 1 week prior. EXAM: CT ANGIOGRAPHY CHEST WITH CONTRAST TECHNIQUE: Multidetector CT imaging of the chest was performed using the  standard protocol during bolus administration of intravenous contrast. Multiplanar CT image reconstructions and MIPs were obtained to evaluate the vascular anatomy. CONTRAST:  146m ISOVUE-370 IOPAMIDOL (ISOVUE-370) INJECTION 76% COMPARISON:  Chest radiograph 04/23/2016 FINDINGS: Cardiovascular: No filling defect within the LEFT or RIGHT main pulmonary artery. The segmental pulmonary arteries are poorly opacified and cannot be adequately evaluated. No RIGHT ventricular strain. Large vein noted in the azygos fissure. Mediastinum/Nodes: No axillary supraclavicular adenopathy. No mediastinal hilar adenopathy. No pericardial effusion. Lungs/Pleura: No pulmonary edema. No pulmonary infarction. No infiltrate Upper Abdomen: Limited view of the liver, kidneys, pancreas are unremarkable. Normal adrenal glands. Benign cysts in the RIGHT hepatic lobe Musculoskeletal: No aggressive osseous lesion Review of the MIP images confirms the above findings. IMPRESSION: 1. No central pulmonary embolism embolism. Segmental branches of the pulmonary arteries are poorly evaluated. No RIGHT ventricular strain. Consider V/Q scan if continued concern for pulmonary embolism. 2. No pulmonary infarction.  No pneumonia or edema. Electronically Signed   By: SSuzy BouchardM.D.   On: 12/22/2017 19:20    Procedures Procedures (including critical  care time)  Medications Ordered in ED Medications  iopamidol (ISOVUE-370) 76 % injection (has no administration in time range)  apixaban (ELIQUIS) Education Kit for DVT/PE patients (has no administration in time range)  iopamidol (ISOVUE-370) 76 % injection 100 mL (100 mLs Intravenous Contrast Given 12/22/17 1843)  sodium chloride 0.9 % bolus 500 mL (0 mLs Intravenous Stopped 12/22/17 2055)  apixaban (ELIQUIS) tablet 10 mg (10 mg Oral Given 12/22/17 2056)     Initial Impression / Assessment and Plan / ED Course  I have reviewed the triage vital signs and the nursing notes.  Pertinent labs & imaging results that were available during my care of the patient were reviewed by me and considered in my medical decision making (see chart for details).    Presents today for evaluation of left-sided calf pain for approximately 1 week.  He had a venous duplex ordered prior to my evaluation which was positive for a left sided DVT.  He also reports that during the past week, while he has been having these leg symptoms, he has also had decreased exercise tolerance.  He denies any chest pain with that, just notes that he is getting winded much sooner than he usually would.  Given DVT with decreased exercise tolerance discussed with patient the need to rule out PE, given that he is at a high risk.  Basic labs were obtained.  EKG obtained, and CT angios PE were ordered.  CT angios PE did not show any large proximal PEs, however segmental branches of pulmonary arteries were poorly evaluated.  Discussed with patient these results, and how he may have small distal PEs that we were unable to see, however given the mild nature of his symptoms he declined additional testing, which I think is reasonable.  He was started on Eliquis, given first dose while in the department, along with prescription for the Eliquis starter pack.  Pharmacy spoke with patient about risks and instructions while on blood thinners and he states his  understanding.  He is instructed that very close PCP follow-up.    Return precautions were discussed with patient who states their understanding.  At the time of discharge patient denied any unaddressed complaints or concerns.  Patient is agreeable for discharge home.   Final Clinical Impressions(s) / ED Diagnoses   Final diagnoses:  Acute deep vein thrombosis (DVT) of other specified vein of left lower extremity ([place]  ED Discharge Orders         Ordered    ELIQUIS STARTER PACK [facility] STARTER PACK) 5 MG TABS     12/22/17 2037           Lorin Glass, [place] 12/23/17 Dierdre Highman    Sherwood Gambler, MD 12/26/17 2010

## 2017-12-22 NOTE — Discharge Instructions (Signed)
Develop chest pain, shortness of breath, or have additional concerns please seek additional medical care and evaluation.  Please follow-up with your primary care doctor.

## 2017-12-22 NOTE — Progress Notes (Signed)
LLE venous duplex prelim: Short segment intramuscular DVT noted in the left gastroc vein (age indeterminate). Superficial thrombosis noted in the a varicose vein in the left calf. Landry Mellow, RDMS, RVT

## 2017-12-22 NOTE — ED Provider Notes (Signed)
Patient placed in Quick Look pathway, seen and evaluated   Chief Complaint: Left calf pain  HPI:   56 year old male presents with left calf pain for the past couple days. He states that he was at a cookout on Saturday and was walking on uneven ground. That night he developed a "lump" over the proximal-medial calf. It became tender over the next couple days. He went to [facility] and was advised to come to the ED to r/o DVT. He denies hx of DVT. No specific trauma  ROS: +L calf pain  Physical Exam:   Gen: No distress, morbidly obese  Neuro: Awake and Alert  Skin: Warm    Focused Exam: Left lower extremity: Tenderness over the proximal, medial calf. No knee tenderness. 2+DP pulse   Initiation of care has begun. The patient has been counseled on the process, plan, and necessity for staying for the completion/evaluation, and the remainder of the medical screening examination    Recardo Evangelist, PA-C 12/22/17 [place], MD 12/24/17 1003

## 2017-12-22 NOTE — ED Notes (Signed)
Pt has Neurosurgeon for Eliquis.

## 2017-12-22 NOTE — ED Triage Notes (Signed)
Patient sent to ED from [facility] for U/S of L lower leg to r/o DVT. Patient states he noticed a lump on medial side of L calf Saturday night - endorses tender to palpation and some pain with ambulation in that area. Palpable lump noted, but no warmth or redness compared to other leg. Denies CP or SOB.

## 2017-12-24 DIAGNOSIS — I824Z2 Acute embolism and thrombosis of unspecified deep veins of left distal lower extremity: Secondary | ICD-10-CM | POA: Diagnosis not present

## 2018-01-16 DIAGNOSIS — G4733 Obstructive sleep apnea (adult) (pediatric): Secondary | ICD-10-CM | POA: Diagnosis not present

## 2018-01-18 ENCOUNTER — Ambulatory Visit (HOSPITAL_COMMUNITY)
Admission: EM | Admit: 2018-01-18 | Discharge: 2018-01-18 | Disposition: A | Discharge status: Home / Self Care | Payer: 59 | Attending: Urgent Care | Admitting: Urgent Care

## 2018-01-18 ENCOUNTER — Other Ambulatory Visit: Payer: Self-pay

## 2018-01-18 ENCOUNTER — Encounter (HOSPITAL_COMMUNITY): Payer: Self-pay | Admitting: Urgent Care

## 2018-01-18 DIAGNOSIS — R05 Cough: Secondary | ICD-10-CM | POA: Diagnosis not present

## 2018-01-18 DIAGNOSIS — D869 Sarcoidosis, unspecified: Secondary | ICD-10-CM

## 2018-01-18 DIAGNOSIS — R059 Cough, unspecified: Secondary | ICD-10-CM

## 2018-01-18 DIAGNOSIS — Z86718 Personal history of other venous thrombosis and embolism: Secondary | ICD-10-CM

## 2018-01-18 MED ORDER — PREDNISONE 20 MG PO TABS: ORAL_TABLET | ORAL | 0 refills | Status: DC

## 2018-01-18 MED ORDER — AZITHROMYCIN 250 MG PO TABS: ORAL_TABLET | ORAL | 0 refills | Status: DC

## 2018-01-18 MED ORDER — HYDROCODONE-HOMATROPINE 5-1.5 MG/5ML PO SYRP: 5.0000 mL | ORAL_SOLUTION | Freq: Every evening | ORAL | 0 refills | Status: DC | PRN

## 2018-01-18 NOTE — ED Provider Notes (Signed)
MRN: [id number] DOB: 1961-02-08  Subjective:   Brent Mccoy is a 57 y.o. male presenting for 3 week history of worsening productive cough. Has also had sinus congestion.  Has been using otc cough medications. Denies smoking history. Denies chest pain, shob, wheezing, n/v, abdominal pain.   No current facility-administered medications for this encounter.   Current Outpatient Medications:  .  amLODipine (NORVASC) 10 MG tablet, Take 10 mg by mouth daily.  , Disp: , Rfl:  .  diphenhydrAMINE (BENADRYL) 50 MG tablet, Take 50 mg by mouth at bedtime as needed for sleep., Disp: , Rfl:  .  ELIQUIS STARTER PACK (ELIQUIS STARTER PACK) 5 MG TABS, Take as directed on package: start with two-5mg  tablets twice daily for 7 days. On day 8, switch to one-5mg  tablet twice daily., Disp: 1 each, Rfl: 0 .  Ginkgo Biloba (GINKOBA) 40 MG TABS, Take 1 tablet by mouth 3 (three) times a week. , Disp: , Rfl:  .  loratadine (CLARITIN) 10 MG tablet, Take 10 mg by mouth daily as needed for allergies., Disp: , Rfl:  .  losartan (COZAAR) 100 MG tablet, Take 100 mg by mouth daily., Disp: , Rfl:  .  NON FORMULARY, Take 1,000 mg by mouth as needed (sexual enhancement). epimedium, Disp: , Rfl:  .  NON FORMULARY, cpap, Disp: , Rfl:  .  Omega-3 Fatty Acids (FISH OIL) 1000 MG CAPS, Take 1 capsule by mouth daily.  , Disp: , Rfl:  .  traMADol (ULTRAM) 50 MG tablet, 1-2 tabs every 8 hours if needed for cough, Disp: 40 tablet, Rfl: 0 .  vitamin E (VITAMIN E) 400 UNIT capsule, Take 400 Units by mouth daily.  , Disp: , Rfl:  .  zolpidem (AMBIEN) 10 MG tablet, Take 10 mg by mouth at bedtime as needed for sleep., Disp: , Rfl:     No Known Allergies   Past Medical History:  Diagnosis Date  . Allergic rhinitis   . DJD (degenerative joint disease)   . GERD (gastroesophageal reflux disease)    hx of - not current  . Hypertension    takes Losartan and Amlodipine daily  . Insomnia    takes Ambien nightly as needed  . Sarcoidosis    . Sleep apnea      Past Surgical History:  Procedure Laterality Date  . COLONOSCOPY    . 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 TOTAL HIP ARTHROPLASTY Left 02/20/2015   Procedure: TOTAL HIP ARTHROPLASTY; LEFT;  Surgeon: Frederik Pear, MD;  Location: [place];  Service: Orthopedics;  Laterality: Left;  Marland Kitchen VASECTOMY      Objective:   Vitals: BP 137/85   Pulse (!) 103   Temp 98 F (36.7 C) (Oral)   Resp 20   SpO2 96%   Physical Exam  Constitutional: He is oriented to person, place, and time. He appears well-developed and well-nourished.  HENT:  Mouth/Throat: Oropharynx is clear and moist.  Eyes: No scleral icterus.  Cardiovascular: Normal rate, regular rhythm, normal heart sounds and intact distal pulses. Exam reveals no gallop and no friction rub.  No murmur heard. Pulmonary/Chest: Effort normal and breath sounds normal. No stridor. No respiratory distress. He has no wheezes. He has no rales.  Neurological: He is alert and oriented to person, place, and time.  Skin: Skin is warm and dry.  Psychiatric:  Appears fatigued.    Assessment and Plan :   Cough  Sarcoidosis  History of DVT (deep  vein thrombosis)  Patient has an appointment with his pulmonologist in 1-2 weeks. He is also undergoing treatment for history of dvt with Eliquis. We will cover his symptoms for infectious process with azithromycin. He has done well with this in the past. He will also use cough suppression medication, prednisone course. Counseled patient on potential for adverse effects with medications prescribed today, patient verbalized understanding. Return-to-clinic precautions discussed, patient verbalized understanding.      Jaynee Eagles, PA-C 01/18/18 1027

## 2018-01-18 NOTE — ED Triage Notes (Signed)
C/O dry cough x 3 wks with some clear nasal congestion.  Reports OTC cough meds not helping.

## 2018-01-20 ENCOUNTER — Encounter: Payer: Self-pay | Admitting: Adult Health

## 2018-01-20 ENCOUNTER — Ambulatory Visit: Payer: 59 | Admitting: Adult Health

## 2018-01-20 DIAGNOSIS — D869 Sarcoidosis, unspecified: Secondary | ICD-10-CM

## 2018-01-20 DIAGNOSIS — I824Z2 Acute embolism and thrombosis of unspecified deep veins of left distal lower extremity: Secondary | ICD-10-CM | POA: Diagnosis not present

## 2018-01-20 DIAGNOSIS — G4733 Obstructive sleep apnea (adult) (pediatric): Secondary | ICD-10-CM | POA: Diagnosis not present

## 2018-01-20 DIAGNOSIS — I82409 Acute embolism and thrombosis of unspecified deep veins of unspecified lower extremity: Secondary | ICD-10-CM | POA: Insufficient documentation

## 2018-01-20 NOTE — Patient Instructions (Addendum)
Continue on CPAP Keep up the good work Work on [employer] Do not drive a sleepy Follow up with Dr. Annamaria Boots  In 1 year and As needed.

## 2018-01-20 NOTE — Progress Notes (Signed)
@Patient  ID: Brent Mccoy, male    DOB: 22-Jul-1960, 57 y.o.   MRN: [id number]  Chief Complaint  Patient presents with  . Follow-up    OSA     Referring provider: Harlan Stains, MD  HPI: 57 year old male never smoker followed for severe sleep apnea.  Carries diagnosis of sarcoidosis  Medical hx significant for DVT 12/2017   TEST  NPSG [place] 05/04/14 AHI 61.2/ hr, desaturation to 81%, CPAP to 11, body weight 422 lbs ACE 04/23/16- 68 ( 9-67) CBC with differential 04/23/2016-WNL Venous Doppler 12/2017 -left + DVT  CT chest 12/2017> negative PE, no infiltrate or edema ,no adenopathy   01/20/2018 Follow up : OSA , Sarcoid  Patient presents for a six-month follow-up patient has underlying severe sleep apnea.  Patient says he wears his CPAP every night.  He never misses a night.  Gets in 6 hours each night.  Patient says he feels rested with no significant daytime sleepiness. Download was requested.  Patient carries a diagnosis of sarcoid.  Previous ACE level in January 2018 was mildly elevated at 68.  CT chest done September 2019 no showed no adenopathy.  No infiltrates or edema.  Patient denies any rash.  Had recent upper respiratory infection cough is now resolving.  Patient was recently diagnosed with a left DVT.  He is now on Eliquis.  This occurred after 4-hour car trip.  He has been followed by his primary care physician.  Patient did have an episode of increased cough, was seen in the emergency room.  Had a CT chest that was negative for PE.     No Known Allergies  Immunization History  Administered Date(s) Administered  . Influenza Split 06/01/2016, 01/20/2017    Past Medical History:  Diagnosis Date  . Allergic rhinitis   . DJD (degenerative joint disease)   . GERD (gastroesophageal reflux disease)    hx of - not current  . Hypertension    takes Losartan and Amlodipine daily  . Insomnia    takes Ambien nightly as needed  . Sarcoidosis   . Sleep apnea      Tobacco History: Social History   Tobacco Use  Smoking Status Never Smoker  Smokeless Tobacco Never Used   Counseling given: Not Answered   Outpatient Medications Prior to Visit  Medication Sig Dispense Refill  . amLODipine (NORVASC) 10 MG tablet Take 10 mg by mouth daily.      Marland Kitchen azithromycin (ZITHROMAX) 250 MG tablet Start with 2 tablets today, then 1 daily thereafter. 6 tablet 0  . diphenhydrAMINE (BENADRYL) 50 MG tablet Take 50 mg by mouth at bedtime as needed for sleep.    Marland Kitchen ELIQUIS STARTER PACK (ELIQUIS STARTER PACK) 5 MG TABS Take as directed on package: start with two-5mg  tablets twice daily for 7 days. On day 8, switch to one-5mg  tablet twice daily. 1 each 0  . Ginkgo Biloba (GINKOBA) 40 MG TABS Take 1 tablet by mouth 3 (three) times a week.     Marland Kitchen HYDROcodone-homatropine (HYCODAN) 5-1.5 MG/5ML syrup Take 5 mLs by mouth at bedtime as needed. 100 mL 0  . loratadine (CLARITIN) 10 MG tablet Take 10 mg by mouth daily as needed for allergies.    . NON FORMULARY Take 1,000 mg by mouth as needed (sexual enhancement). epimedium    . NON FORMULARY cpap    . Omega-3 Fatty Acids (FISH OIL) 1000 MG CAPS Take 1 capsule by mouth daily.      . predniSONE (DELTASONE)  20 MG tablet Take 2 tablets daily with breakfast. 10 tablet 0  . valsartan-hydrochlorothiazide (DIOVAN-HCT) 320-12.5 MG tablet Take 1 tablet by mouth daily.    . vitamin E (VITAMIN E) 400 UNIT capsule Take 400 Units by mouth daily.      Marland Kitchen zolpidem (AMBIEN) 10 MG tablet Take 10 mg by mouth at bedtime as needed for sleep.    . traMADol (ULTRAM) 50 MG tablet 1-2 tabs every 8 hours if needed for cough (Patient not taking: Reported on 01/20/2018) 40 tablet 0  . losartan (COZAAR) 100 MG tablet Take 100 mg by mouth daily.     No facility-administered medications prior to visit.      Review of Systems  Constitutional:   No  weight loss, night sweats,  Fevers, chills,  +fatigue, or  lassitude.  HEENT:   No headaches,  Difficulty  swallowing,  Tooth/dental problems, or  Sore throat,                No sneezing, itching, ear ache, nasal congestion, post nasal drip,   CV:  No chest pain,  Orthopnea, PND, swelling in lower extremities, anasarca, dizziness, palpitations, syncope.   GI  No heartburn, indigestion, abdominal pain, nausea, vomiting, diarrhea, change in bowel habits, loss of appetite, bloody stools.   Resp:    No chest wall deformity  Skin: no rash or lesions.  GU: no dysuria, change in color of urine, no urgency or frequency.  No flank pain, no hematuria   MS:  No joint pain or swelling.  No decreased range of motion.  No back pain.    Physical Exam  BP (!) 142/84 (BP Location: Left Arm, Cuff Size: Large)   Pulse 86   Ht 6\' 3"  (1.905 m)   Wt (!) 436 lb (197.8 kg)   SpO2 99%   BMI 54.50 kg/m   GEN: A/Ox3; pleasant , NAD, obese    HEENT:  [place]/AT,  EACs-clear, TMs-wnl, NOSE-clear, THROAT-clear, no lesions, no postnasal drip or exudate noted. Class 3 MP airway   NECK:  Supple w/ fair ROM; no JVD; normal carotid impulses w/o bruits; no thyromegaly or nodules palpated; no lymphadenopathy.    RESP  Clear  P & A; w/o, wheezes/ rales/ or rhonchi. no accessory muscle use, no dullness to percussion  CARD:  RRR, no m/r/g, tr peripheral edema, pulses intact, no cyanosis or clubbing.  GI:   Soft & nt; nml bowel sounds; no organomegaly or masses detected.   Musco: Warm bil, no deformities or joint swelling noted.   Neuro: alert, no focal deficits noted.    Skin: Warm, no lesions or rashes    Lab Results:  CBC  ProBNP No results found for: PROBNP  Imaging: Ct Angio Chest Pe W/cm &/or Wo Cm  Result Date: 12/22/2017 CLINICAL DATA:  DVT 1 week prior. EXAM: CT ANGIOGRAPHY CHEST WITH CONTRAST TECHNIQUE: Multidetector CT imaging of the chest was performed using the standard protocol during bolus administration of intravenous contrast. Multiplanar CT image reconstructions and MIPs were obtained to  evaluate the vascular anatomy. CONTRAST:  176mL ISOVUE-370 IOPAMIDOL (ISOVUE-370) INJECTION 76% COMPARISON:  Chest radiograph 04/23/2016 FINDINGS: Cardiovascular: No filling defect within the LEFT or RIGHT main pulmonary artery. The segmental pulmonary arteries are poorly opacified and cannot be adequately evaluated. No RIGHT ventricular strain. Large vein noted in the azygos fissure. Mediastinum/Nodes: No axillary supraclavicular adenopathy. No mediastinal hilar adenopathy. No pericardial effusion. Lungs/Pleura: No pulmonary edema. No pulmonary infarction. No infiltrate Upper Abdomen: Limited  view of the liver, kidneys, pancreas are unremarkable. Normal adrenal glands. Benign cysts in the RIGHT hepatic lobe Musculoskeletal: No aggressive osseous lesion Review of the MIP images confirms the above findings. IMPRESSION: 1. No central pulmonary embolism embolism. Segmental branches of the pulmonary arteries are poorly evaluated. No RIGHT ventricular strain. Consider V/Q scan if continued concern for pulmonary embolism. 2. No pulmonary infarction.  No pneumonia or edema. Electronically Signed   By: Suzy Bouchard M.D.   On: 12/22/2017 19:20     Assessment & Plan:   Obstructive sleep apnea Well-controlled on CPAP clinically.  CPAP download was requested.  Plan  Patient Instructions  Continue on CPAP Keep up the good work Work on healthy weight Do not drive a sleepy Follow up with Dr. Annamaria Boots  In 1 year and As needed.        Sarcoidosis ([place]) Carries diagnosis of sarcoidosis.  Does not appear active.  Most recent CT chest did not show any adenopathy or lung parenchymal changes.  Has no active symptoms.  Obesity, morbid ([place]) Weight loss  DVT (deep venous thrombosis) ([place]) Recently diagnosed left DVT patient appears stable on Eliquis.  Continue follow-up with PCP.     Rexene Edison, NP 01/20/2018

## 2018-01-20 NOTE — Assessment & Plan Note (Signed)
Carries diagnosis of sarcoidosis.  Does not appear active.  Most recent CT chest did not show any adenopathy or lung parenchymal changes.  Has no active symptoms.

## 2018-01-20 NOTE — Addendum Note (Signed)
Addended by: Parke Poisson E on: 01/20/2018 03:45 PM   Modules accepted: Orders

## 2018-01-20 NOTE — Assessment & Plan Note (Signed)
Well-controlled on CPAP clinically.  CPAP download was requested.  Plan  Patient Instructions  Continue on CPAP Keep up the good work Work on healthy weight Do not drive a sleepy Follow up with Dr. Annamaria Boots  In 1 year and As needed.

## 2018-01-20 NOTE — Assessment & Plan Note (Signed)
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  Continue follow-up with PCP.

## 2018-01-20 NOTE — Assessment & Plan Note (Signed)
-   Weight loss 

## 2018-02-12 DIAGNOSIS — M25461 Effusion, right knee: Secondary | ICD-10-CM | POA: Diagnosis not present

## 2018-02-12 DIAGNOSIS — Z86718 Personal history of other venous thrombosis and embolism: Secondary | ICD-10-CM | POA: Diagnosis not present

## 2018-02-12 DIAGNOSIS — R05 Cough: Secondary | ICD-10-CM | POA: Diagnosis not present

## 2018-04-10 DIAGNOSIS — M25469 Effusion, unspecified knee: Secondary | ICD-10-CM | POA: Diagnosis not present

## 2018-04-10 DIAGNOSIS — I10 Essential (primary) hypertension: Secondary | ICD-10-CM | POA: Diagnosis not present

## 2018-04-10 DIAGNOSIS — Z86718 Personal history of other venous thrombosis and embolism: Secondary | ICD-10-CM | POA: Diagnosis not present

## 2018-06-10 ENCOUNTER — Other Ambulatory Visit: Payer: Self-pay | Admitting: Family Medicine

## 2018-06-10 DIAGNOSIS — M79605 Pain in left leg: Secondary | ICD-10-CM

## 2018-06-10 DIAGNOSIS — M25461 Effusion, right knee: Secondary | ICD-10-CM | POA: Diagnosis not present

## 2018-06-12 ENCOUNTER — Ambulatory Visit
Admission: RE | Admit: 2018-06-12 | Discharge: 2018-06-12 | Disposition: A | Discharge status: Home / Self Care | Payer: 59 | Source: Ambulatory Visit | Attending: Family Medicine | Admitting: Family Medicine

## 2018-06-12 DIAGNOSIS — M79661 Pain in right lower leg: Secondary | ICD-10-CM | POA: Diagnosis not present

## 2018-06-12 DIAGNOSIS — M79605 Pain in left leg: Secondary | ICD-10-CM

## 2018-06-25 ENCOUNTER — Ambulatory Visit: Payer: 59 | Admitting: Internal Medicine

## 2019-01-19 ENCOUNTER — Ambulatory Visit: Payer: 59 | Admitting: Internal Medicine

## 2019-03-20 ENCOUNTER — Emergency Department (HOSPITAL_BASED_OUTPATIENT_CLINIC_OR_DEPARTMENT_OTHER): Payer: 59

## 2019-03-20 ENCOUNTER — Other Ambulatory Visit: Payer: Self-pay

## 2019-03-20 ENCOUNTER — Encounter (HOSPITAL_BASED_OUTPATIENT_CLINIC_OR_DEPARTMENT_OTHER): Payer: Self-pay | Admitting: *Deleted

## 2019-03-20 ENCOUNTER — Inpatient Hospital Stay (HOSPITAL_BASED_OUTPATIENT_CLINIC_OR_DEPARTMENT_OTHER)
Admission: EM | Admit: 2019-03-20 | Discharge: 2019-03-23 | DRG: 175 | Disposition: A | Discharge status: Home / Self Care | Payer: 59 | Attending: Internal Medicine | Admitting: Internal Medicine

## 2019-03-20 DIAGNOSIS — Z79899 Other long term (current) drug therapy: Secondary | ICD-10-CM | POA: Diagnosis not present

## 2019-03-20 DIAGNOSIS — R001 Bradycardia, unspecified: Secondary | ICD-10-CM | POA: Diagnosis not present

## 2019-03-20 DIAGNOSIS — I10 Essential (primary) hypertension: Secondary | ICD-10-CM | POA: Diagnosis not present

## 2019-03-20 DIAGNOSIS — I495 Sick sinus syndrome: Secondary | ICD-10-CM | POA: Diagnosis not present

## 2019-03-20 DIAGNOSIS — Z800 Family history of malignant neoplasm of digestive organs: Secondary | ICD-10-CM

## 2019-03-20 DIAGNOSIS — G4733 Obstructive sleep apnea (adult) (pediatric): Secondary | ICD-10-CM | POA: Diagnosis present

## 2019-03-20 DIAGNOSIS — I5032 Chronic diastolic (congestive) heart failure: Secondary | ICD-10-CM | POA: Diagnosis present

## 2019-03-20 DIAGNOSIS — T447X5A Adverse effect of beta-adrenoreceptor antagonists, initial encounter: Secondary | ICD-10-CM | POA: Diagnosis not present

## 2019-03-20 DIAGNOSIS — I2699 Other pulmonary embolism without acute cor pulmonale: Secondary | ICD-10-CM | POA: Diagnosis not present

## 2019-03-20 DIAGNOSIS — Z86718 Personal history of other venous thrombosis and embolism: Secondary | ICD-10-CM | POA: Diagnosis not present

## 2019-03-20 DIAGNOSIS — G4700 Insomnia, unspecified: Secondary | ICD-10-CM | POA: Diagnosis present

## 2019-03-20 DIAGNOSIS — I824Z2 Acute embolism and thrombosis of unspecified deep veins of left distal lower extremity: Secondary | ICD-10-CM | POA: Diagnosis not present

## 2019-03-20 DIAGNOSIS — I959 Hypotension, unspecified: Secondary | ICD-10-CM | POA: Diagnosis not present

## 2019-03-20 DIAGNOSIS — I471 Supraventricular tachycardia: Secondary | ICD-10-CM | POA: Diagnosis not present

## 2019-03-20 DIAGNOSIS — Z96642 Presence of left artificial hip joint: Secondary | ICD-10-CM | POA: Diagnosis present

## 2019-03-20 DIAGNOSIS — I130 Hypertensive heart and chronic kidney disease with heart failure and stage 1 through stage 4 chronic kidney disease, or unspecified chronic kidney disease: Secondary | ICD-10-CM | POA: Diagnosis present

## 2019-03-20 DIAGNOSIS — N1831 Chronic kidney disease, stage 3a: Secondary | ICD-10-CM | POA: Diagnosis present

## 2019-03-20 DIAGNOSIS — D869 Sarcoidosis, unspecified: Secondary | ICD-10-CM | POA: Diagnosis present

## 2019-03-20 DIAGNOSIS — Z7952 Long term (current) use of systemic steroids: Secondary | ICD-10-CM

## 2019-03-20 DIAGNOSIS — R0789 Other chest pain: Secondary | ICD-10-CM | POA: Diagnosis not present

## 2019-03-20 DIAGNOSIS — I82409 Acute embolism and thrombosis of unspecified deep veins of unspecified lower extremity: Secondary | ICD-10-CM | POA: Diagnosis present

## 2019-03-20 DIAGNOSIS — R000 Tachycardia, unspecified: Secondary | ICD-10-CM | POA: Diagnosis not present

## 2019-03-20 DIAGNOSIS — E876 Hypokalemia: Secondary | ICD-10-CM | POA: Diagnosis present

## 2019-03-20 DIAGNOSIS — Z6841 Body Mass Index (BMI) 40.0 and over, adult: Secondary | ICD-10-CM | POA: Diagnosis not present

## 2019-03-20 DIAGNOSIS — K219 Gastro-esophageal reflux disease without esophagitis: Secondary | ICD-10-CM | POA: Diagnosis present

## 2019-03-20 DIAGNOSIS — I2694 Multiple subsegmental pulmonary emboli without acute cor pulmonale: Secondary | ICD-10-CM | POA: Diagnosis not present

## 2019-03-20 DIAGNOSIS — N183 Chronic kidney disease, stage 3 unspecified: Secondary | ICD-10-CM | POA: Diagnosis not present

## 2019-03-20 DIAGNOSIS — M1612 Unilateral primary osteoarthritis, left hip: Secondary | ICD-10-CM | POA: Diagnosis present

## 2019-03-20 DIAGNOSIS — R079 Chest pain, unspecified: Secondary | ICD-10-CM | POA: Diagnosis not present

## 2019-03-20 DIAGNOSIS — R7989 Other specified abnormal findings of blood chemistry: Secondary | ICD-10-CM | POA: Diagnosis not present

## 2019-03-20 DIAGNOSIS — I2609 Other pulmonary embolism with acute cor pulmonale: Secondary | ICD-10-CM | POA: Diagnosis present

## 2019-03-20 DIAGNOSIS — Z20828 Contact with and (suspected) exposure to other viral communicable diseases: Secondary | ICD-10-CM | POA: Diagnosis present

## 2019-03-20 DIAGNOSIS — I2602 Saddle embolus of pulmonary artery with acute cor pulmonale: Secondary | ICD-10-CM | POA: Diagnosis not present

## 2019-03-20 LAB — BASIC METABOLIC PANEL
Anion gap: 11 (ref 5–15)
BUN: 19 mg/dL (ref 6–20)
CO2: 25 mmol/L (ref 22–32)
Calcium: 8.7 mg/dL — ABNORMAL LOW (ref 8.9–10.3)
Chloride: 101 mmol/L (ref 98–111)
Creatinine, Ser: 1.34 mg/dL — ABNORMAL HIGH (ref 0.61–1.24)
GFR calc Af Amer: >60 mL/min (ref >60)
GFR calc non Af Amer: 57 mL/min — ABNORMAL LOW (ref >60)
Glucose, Bld: 100 mg/dL — ABNORMAL HIGH (ref 70–99)
Potassium: 3.4 mmol/L — ABNORMAL LOW (ref 3.5–5.1)
Sodium: 137 mmol/L (ref 135–145)

## 2019-03-20 LAB — D-DIMER, QUANTITATIVE: D-Dimer, Quant: 6 ug/mL-FEU — ABNORMAL HIGH (ref 0.00–0.50)

## 2019-03-20 LAB — BRAIN NATRIURETIC PEPTIDE: B Natriuretic Peptide: 50.6 pg/mL (ref 0.0–100.0)

## 2019-03-20 LAB — CBC
HCT: 49.1 % (ref 39.0–52.0)
Hemoglobin: 16 g/dL (ref 13.0–17.0)
MCH: 28.8 pg (ref 26.0–34.0)
MCHC: 32.6 g/dL (ref 30.0–36.0)
MCV: 88.5 fL (ref 80.0–100.0)
Platelets: 168 10*3/uL (ref 150–400)
RBC: 5.55 MIL/uL (ref 4.22–5.81)
RDW: 13.2 % (ref 11.5–15.5)
WBC: 5.8 10*3/uL (ref 4.0–10.5)
nRBC: 0 % (ref 0.0–0.2)

## 2019-03-20 LAB — TROPONIN I (HIGH SENSITIVITY)
Troponin I (High Sensitivity): 261 ng/L (ref <18)
Troponin I (High Sensitivity): 384 ng/L (ref <18)

## 2019-03-20 LAB — SARS CORONAVIRUS 2 AG (30 MIN TAT): SARS Coronavirus 2 Ag: NEGATIVE

## 2019-03-20 MED ORDER — IOHEXOL 350 MG/ML SOLN
100.0000 mL | Freq: Once | INTRAVENOUS | Status: AC | PRN
  Administered 2019-03-20: 100 mL via INTRAVENOUS

## 2019-03-20 MED ORDER — HEPARIN BOLUS VIA INFUSION
7500.0000 [IU] | Freq: Once | INTRAVENOUS | Status: AC
  Administered 2019-03-20: 7500 [IU] via INTRAVENOUS

## 2019-03-20 MED ORDER — HEPARIN (PORCINE) 25000 UT/250ML-% IV SOLN
2200.0000 [IU]/h | INTRAVENOUS | Status: DC
  Administered 2019-03-20 – 2019-03-22 (×4): 2200 [IU]/h via INTRAVENOUS
  Filled 2019-03-20 (×4): qty 250

## 2019-03-20 MED ORDER — CHLORHEXIDINE GLUCONATE CLOTH 2 % EX PADS
6.0000 | MEDICATED_PAD | Freq: Every day | CUTANEOUS | Status: DC
  Administered 2019-03-21 – 2019-03-22 (×2): 6 via TOPICAL

## 2019-03-20 NOTE — ED Notes (Signed)
[facility] notified (Jaime) - patient ready for transport 

## 2019-03-20 NOTE — ED Triage Notes (Signed)
Pt reports chest pain "indigestion" and increased SOB with walking short distances since this morning

## 2019-03-20 NOTE — H&P (Addendum)
PCP:   Harlan Stains, MD    Chief Complaint: Shortness of breath   HPI: This is a 58 year old male who presents with complaint of shortness of breath.  He stated that couple months ago he noted when he worked in the yard he got winded after 10 minutes, and he had to take a good water rest.  This was inconsistent and he paid no attention, he thought it was simply because he was out of shape. About 1 week ago he started doing some light calisthenics and again noted he was winded with a light exercise.  This morning he went to the bathroom and was winded again. He went to his office and noted he was winded with small movements. He left work and had his wife take him to [facility] ER.  He reports mild centrally located chest pains, that more felt like indesgtion. It was mild at its worse 3/10. More of a nuisance than a pain. He denies any wheezing or hemoptysis. He did have a dry cough for a short spell. At [facility], work-up revealed the patient has an extensive problem embolism.  Patient was transferred to [facility]  The patient has a prior history of lower extremity DVT approximately 2 years ago.  He was treated for approximately 2 months and anticoagulant discontinued.  The patient states that he changed his job in August, at his current job he sits a lot  History provided by the patient.  Review of Systems:  The patient denies anorexia, fever, weight loss,, vision loss, decreased hearing, hoarseness, chest pain, SOB, syncope, dyspnea on exertion, peripheral edema, balance deficits, hemoptysis, abdominal pain, melena, hematochezia, severe indigestion/heartburn, hematuria, incontinence, genital sores, muscle weakness, suspicious skin lesions, transient blindness, difficulty walking, depression, unusual weight change, abnormal bleeding, enlarged lymph nodes, angioedema, and breast masses.  Past Medical History: Past Medical History:  Diagnosis Date  . Allergic rhinitis   . DJD (degenerative joint  disease)   . GERD (gastroesophageal reflux disease)    hx of - not current  . Hypertension    takes Losartan and Amlodipine daily  . Insomnia    takes Ambien nightly as needed  . Sarcoidosis   . Sleep apnea    Past Surgical History:  Procedure Laterality Date  . COLONOSCOPY    . 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 Authorizing Provider  amLODipine (NORVASC) 10 MG tablet Take 10 mg by mouth daily.      [provider]  azithromycin (ZITHROMAX) 250 MG tablet Start with 2 tablets today, then 1 daily thereafter. 01/18/18   Jaynee Eagles, PA-C  diphenhydrAMINE (BENADRYL) 50 MG tablet Take 50 mg by mouth at bedtime as needed for sleep.    [provider]  ELIQUIS STARTER PACK (ELIQUIS STARTER PACK) 5 MG TABS Take as directed on package: start with two-'5mg'$  tablets twice daily for 7 days. On day 8, switch to one-'5mg'$  tablet twice daily. 12/22/17   Lorin Glass, PA-C  Ginkgo [facility]) 40 MG TABS Take 1 tablet by mouth 3 (three) times a week.     [provider]  HYDROcodone-homatropine (HYCODAN) 5-1.5 MG/5ML syrup Take 5 mLs by mouth at bedtime as needed.  01/18/18   Jaynee Eagles, PA-C  loratadine (CLARITIN) 10 MG tablet Take 10 mg by mouth daily as needed for allergies.    [provider]  NON FORMULARY Take 1,000 mg by mouth as needed (sexual enhancement). epimedium    [provider]  NON FORMULARY cpap    [provider]  Omega-3 Fatty Acids (FISH OIL) 1000 MG CAPS Take 1 capsule by mouth daily.      [provider]  predniSONE (DELTASONE) 20 MG tablet Take 2 tablets daily with breakfast. 01/18/18   Jaynee Eagles, PA-C  traMADol (ULTRAM)  50 MG tablet 1-2 tabs every 8 hours if needed for cough Patient not taking: Reported on 01/20/2018 04/23/16   Deneise Lever, MD  valsartan-hydrochlorothiazide (DIOVAN-HCT) 320-12.5 MG tablet Take 1 tablet by mouth daily.    [provider]  vitamin E (VITAMIN E) 400 UNIT capsule Take 400 Units by mouth daily.      [provider]  zolpidem (AMBIEN) 10 MG tablet Take 10 mg by mouth at bedtime as needed for sleep.    [provider]    Allergies:  No Known Allergies  Social History:  reports that he has never smoked. He has never used smokeless tobacco. He reports previous alcohol use. He reports that he does not use drugs.  Family History: Family History  Problem Relation Age of Onset  . Rectal cancer Father     Physical Exam: Vitals:   03/20/19 2100 03/20/19 2101 03/20/19 2130 03/20/19 2153  BP: (!) 135/97  (!) 134/94   Pulse: (!) 111 (!) 113 (!) 113 (!) 114  Resp: (!) 27 (!) 26 (!) 29 14  Temp:      TempSrc:      SpO2: (!) 88% 92% 95% 95%  Weight:      Height:        General:  Alert and oriented times three, no acute distress, obese male Eyes: PERRLA, pink conjunctiva, no scleral icterus ENT: Moist oral mucosa, neck supple, no thyromegaly Lungs: clear to ascultation, no wheeze, no crackles, no use of accessory muscles Cardiovascular: regular rate and rhythm, no regurgitation, no gallops, no murmurs. No carotid bruits, no JVD Abdomen: soft, positive BS, non-tender, non-distended, no organomegaly, not an acute abdomen GU: not examined Neuro: CN II - XII grossly intact, sensation intact Musculoskeletal: strength 5/5 all extremities, no clubbing, cyanosis or edema Skin: no rash, no subcutaneous crepitation, no decubitus Psych: appropriate patient   Labs on Admission:  Recent Labs    03/20/19 1703  NA 137  K 3.4*  CL 101  CO2 25  GLUCOSE 100*  BUN 19  CREATININE 1.34*  CALCIUM 8.7*   No results for input(s): AST, ALT, ALKPHOS, BILITOT,  PROT, ALBUMIN in the last 72 hours. No results for input(s): LIPASE, AMYLASE in the last 72 hours. Recent Labs    03/20/19 1703  WBC 5.8  HGB 16.0  HCT 49.1  MCV 88.5  PLT 168   No results for input(s): CKTOTAL, CKMB, CKMBINDEX, TROPONINI in the last 72 hours. Invalid input(s): [place]    03/20/19 1703  DDIMER 6.00*   Results for STEPHAUN, MILLION (MRN [id number]) as of 03/21/2019 00:52  Ref. Range 03/20/2019 19:24  Troponin I (High Sensitivity) Latest Ref Range: <18 ng/L 384 (HH)    No results for input(s): HGBA1C in the last 72 hours. No results for input(s): CHOL, HDL, LDLCALC, TRIG, CHOLHDL, LDLDIRECT in the last 72 hours. No results for input(s):  TSH, T4TOTAL, T3FREE, THYROIDAB in the last 72 hours.  Invalid input(s): FREET3 No results for input(s): VITAMINB12, FOLATE, FERRITIN, TIBC, IRON, RETICCTPCT in the last 72 hours.  Micro Results: Recent Results (from the past 240 hour(s))  SARS Coronavirus 2 Ag (30 min TAT) - Nasal Swab (BD Veritor Kit)     Status: None   Collection Time: 03/20/19  5:03 PM   Specimen: Nasal Swab (BD Veritor Kit)  Result Value Ref Range Status   SARS Coronavirus 2 Ag NEGATIVE NEGATIVE Final    Comment: (NOTE) SARS-CoV-2 antigen NOT DETECTED.  Negative results are presumptive.  Negative results do not preclude SARS-CoV-2 infection and should not be used as the sole basis for treatment or other patient management decisions, including infection  control decisions, particularly in the presence of clinical signs and  symptoms consistent with COVID-19, or in those who have been in contact with the virus.  Negative results must be combined with clinical observations, patient history, and epidemiological information. The expected result is Negative. Fact Sheet for Patients: PodPark.tn Fact Sheet for Healthcare Providers: GiftContent.is This test is not yet approved or cleared  by the [place] FDA and  has been authorized for detection and/or diagnosis of SARS-CoV-2 by FDA under an Emergency Use Authorization (EUA).  This EUA will remain in effect (meaning this test can be used) for the duration of  the COVID-19 de claration under Section 564(b)(1) of the Act, 21 U.S.C. section 360bbb-3(b)(1), unless the authorization is terminated or revoked sooner. Performed at [facility], [address] [place], [place] [address]      Radiological Exams on Admission: Dg Chest 2 View  Result Date: 03/20/2019 CLINICAL DATA:  58 year old male with shortness of breath and chest pain. EXAM: CHEST - 2 VIEW COMPARISON:  Chest radiograph dated 04/23/2016. FINDINGS: The lungs are clear. There is no pleural effusion pneumothorax. The cardiac silhouette is within normal limits. An accessory azygos fissure noted. No acute osseous pathology. IMPRESSION: No active cardiopulmonary disease. Electronically Signed   By: Anner Crete M.D.   On: 03/20/2019 17:10   Ct Angio Chest Pe W/cm &/or Wo Cm  Result Date: 03/20/2019 CLINICAL DATA:  Elevated D-dimer, shortness of breath. EXAM: CT ANGIOGRAPHY CHEST WITH CONTRAST TECHNIQUE: Multidetector CT imaging of the chest was performed using the standard protocol during bolus administration of intravenous contrast. Multiplanar CT image reconstructions and MIPs were obtained to evaluate the vascular anatomy. CONTRAST:  158m OMNIPAQUE IOHEXOL 350 MG/ML SOLN COMPARISON:  12/22/2017 FINDINGS: Cardiovascular: Numerous bilateral large and moderate size pulmonary emboli noted in the central left and right pulmonary arteries and extending into the lobar and segmental branches. Elevated RV: LV ratio of 1.3. Aorta is normal caliber. Mediastinum/Nodes: No mediastinal, hilar, or axillary adenopathy. Trachea and esophagus are unremarkable. Thyroid unremarkable. Lungs/Pleura: No confluent airspace opacities or effusions. Upper Abdomen: Imaging into  the upper abdomen shows no acute findings. Musculoskeletal: Chest wall soft tissues are unremarkable. No acute bony abnormality. Review of the MIP images confirms the above findings. IMPRESSION: Positive for acute PE with CT evidence of right heart strain (RV/LV Ratio = 1.3) consistent with at least submassive (intermediate risk) PE. The presence of right heart strain has been associated with an increased risk of morbidity and mortality. Please activate Code PE by paging [phone] Critical Value/emergent results were called by telephone at the time of interpretation on 03/20/2019 at 7:00 pm to [email or web address], who verbally acknowledged these results. Electronically Signed   By: KLennette Bihari  [place] M.D.   On: 03/20/2019 19:01    Assessment/Plan Present on Admission . Pulmonary embolism ([place]) -Admit to stepdown -Heparin drip initiated -2D echo in a.m. -Oxygen to keep sats with a 88%.  The patient is currently not on oxygen however he is a bit tachycardic -Pain meds as needed -Cardiology consult if needed to be placed by a.m. team due to possible right heart strain -Bilateral lower extremity Dopplers ordered.  Unable to do hypercoagulable work-up as patient is already been started on heparin drip  Elevated troponin -No significant chest pains, no ischemic changes.  Likely due to PE and its size -We will cycle cardiac enzymes, EKG in a.m.,  -2D echo in a.m. -Cardiology consult per a.m. team -ER physician at the [facility] discussed findings with the intensivist on-call who is in agreement with above  Hypertension -Stable, home meds resumed  Obstructive sleep apnea -CPAP ordered  Extreme morbid obesity -  Peace Jost 03/20/2019, 11:02 PM

## 2019-03-20 NOTE — Progress Notes (Addendum)
Brent Mccoy for Heparin  Indication: pulmonary embolus  No Known Allergies  Patient Measurements: Height: 6\' 3"  (190.5 cm) Weight: (!) 410 lb (186 kg) IBW/kg (Calculated) : 84.5 Heparin Dosing Weight: 129.7 kg   Vital Signs: Temp: 99 F (37.2 C) (11/28 1614) Temp Source: Oral (11/28 1614) BP: 126/85 (11/28 1830) Pulse Rate: 109 (11/28 1830)  Labs: Recent Labs    03/20/19 1703  HGB 16.0  HCT 49.1  PLT 168  CREATININE 1.34*  TROPONINIHS 261*    Estimated Creatinine Clearance: 103.7 mL/min (A) (by C-G formula based on SCr of 1.34 mg/dL (H)).   Medical History: Past Medical History:  Diagnosis Date  . Allergic rhinitis   . DJD (degenerative joint disease)   . GERD (gastroesophageal reflux disease)    hx of - not current  . Hypertension    takes Losartan and Amlodipine daily  . Insomnia    takes Ambien nightly as needed  . Sarcoidosis   . Sleep apnea     Medications:  Scheduled:   Assessment: Patient is a 8 yom that presented to the ED with chest pain. The patient has a hx of a DVT last year as was on Eliquis for a little over a month per the patient then was taken off and switched to aspirin per the patient. Patient has a PE at this time and pharmacy has been consulted to dose heparin in this patient.  Goal of Therapy:  Heparin level 0.3-0.7 units/ml Monitor platelets by anticoagulation protocol: Yes   Plan:  - Heparin 7500 units - Heparin Drip @ 2200 units/hr  - Obtain heparin level in 6 hours  - Monitor for s/s of bleeding and CBC daily while on heparin   Duanne Limerick PharmD. BCPS  03/20/2019,7:21 PM

## 2019-03-20 NOTE — ED Provider Notes (Signed)
[facility] EMERGENCY DEPARTMENT Provider Note   CSN: [id number] Arrival date & time: 03/20/19  1601     History   Chief Complaint Chief Complaint  Patient presents with   Chest Pain    HPI Brent Mccoy is a 58 y.o. male.     Patient is a 58 year old male who presents with chest pain.  He has a history of sarcoidosis, GERD, hypertension and prior DVT.  He had a DVT last year and he says he was on Eliquis for about 30 to 45 days but then was taken off of it and started on aspirin.  He comes in today with chest pain.  He says over the last 2 to 3 days he has had some shortness of breath with minor exercise.  He thought maybe he was just doing some new exercises and that is what brought it on.  However today he was more short of breath with minimal exertion even walking short distances.  He also had some associated chest pain which he describes as a tightness/pressure to [name] of his chest.  It is nonradiating.  It was brought on by exertion and relieved with rest.  Denies any similar symptoms in the past.  He has had a little bit of a dry cough over the last 3 or 4 days with a little bit of nasal congestion.  No fevers.  No known Covid exposures.  No leg swelling or calf pain.  He has 9 siblings and one of them had heart disease however it was an older age in his 45s.  He is not a smoker.     Past Medical History:  Diagnosis Date   Allergic rhinitis    DJD (degenerative joint disease)    GERD (gastroesophageal reflux disease)    hx of - not current   Hypertension    takes Losartan and Amlodipine daily   Insomnia    takes Ambien nightly as needed   Sarcoidosis    Sleep apnea     Patient Active Problem List   Diagnosis Date Noted   Pulmonary emboli ([place]) 03/20/2019   Pulmonary embolism ([place]) 03/20/2019   DVT (deep venous thrombosis) ([place]) 01/20/2018   Obesity, morbid ([place]) 06/25/2017   Obstructive sleep apnea 06/30/2016   Primary  osteoarthritis of left hip 02/20/2015   Arthritis, hip 02/20/2015   HYPERTENSION 06/15/2010   ALLERGIC RHINITIS 06/15/2010   INSOMNIA 03/28/2008   Sarcoidosis ([place]) 01/07/2008    Past Surgical History:  Procedure Laterality Date   COLONOSCOPY     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 Authorizing Provider  amLODipine (NORVASC) 10 MG tablet Take 10 mg by mouth daily.      [provider]  azithromycin (ZITHROMAX) 250 MG tablet Start with 2 tablets today, then 1 daily thereafter. 01/18/18   Brent Eagles, PA-C  diphenhydrAMINE (BENADRYL) 50 MG tablet Take 50 mg by mouth at bedtime as needed for sleep.    [provider]  ELIQUIS STARTER PACK (ELIQUIS STARTER PACK) 5 MG TABS Take as directed on package: start with two-5mg  tablets twice daily for 7 days. On day 8,  switch to one-5mg  tablet twice daily. 12/22/17   Brent Glass, PA-C  Ginkgo Biloba [facility]) 40 MG TABS Take 1 tablet by mouth 3 (three) times a week.     [provider]  HYDROcodone-homatropine (HYCODAN) 5-1.5 MG/5ML syrup Take 5 mLs by mouth at bedtime as needed. 01/18/18   Brent Eagles, PA-C  loratadine (CLARITIN) 10 MG tablet Take 10 mg by mouth daily as needed for allergies.    [provider]  NON FORMULARY Take 1,000 mg by mouth as needed (sexual enhancement). epimedium    [provider]  NON FORMULARY cpap    [provider]  Omega-3 Fatty Acids (FISH OIL) 1000 MG CAPS Take 1 capsule by mouth daily.      [provider]  predniSONE (DELTASONE) 20 MG tablet Take 2 tablets daily with breakfast. 01/18/18   Brent Eagles, PA-C  traMADol (ULTRAM) 50 MG tablet  1-2 tabs every 8 hours if needed for cough Patient not taking: Reported on 01/20/2018 04/23/16   Brent Lever, MD  valsartan-hydrochlorothiazide (DIOVAN-HCT) 320-12.5 MG tablet Take 1 tablet by mouth daily.    [provider]  vitamin E (VITAMIN E) 400 UNIT capsule Take 400 Units by mouth daily.      [provider]  zolpidem (AMBIEN) 10 MG tablet Take 10 mg by mouth at bedtime as needed for sleep.    [provider]    Family History Family History  Problem Relation Age of Onset   Rectal cancer Father     Social History Social History   Tobacco Use   Smoking status: Never Smoker   Smokeless tobacco: Never Used  Substance Use Topics   Alcohol use: Not Currently   Drug use: No     Allergies   Patient has no known allergies.   Review of Systems Review of Systems  Constitutional: Positive for fatigue. Negative for chills, diaphoresis and fever.  HENT: Positive for congestion. Negative for rhinorrhea and sneezing.   Eyes: Negative.   Respiratory: Positive for cough and shortness of breath. Negative for chest tightness.   Cardiovascular: Positive for chest pain. Negative for leg swelling.  Gastrointestinal: Negative for abdominal pain, blood in stool, diarrhea, nausea and vomiting.  Genitourinary: Negative for difficulty urinating, flank pain, frequency and hematuria.  Musculoskeletal: Negative for arthralgias and back pain.  Skin: Negative for rash.  Neurological: Negative for dizziness, speech difficulty, weakness, numbness and headaches.     Physical Exam Updated Vital Signs BP (!) 134/94    Pulse (!) 114    Temp 99 F (37.2 C) (Oral)    Resp 14    Ht 6\' 3"  (1.905 m)    Wt (!) 186 kg    SpO2 95%    BMI 51.25 kg/m   Physical Exam Constitutional:      Appearance: He is well-developed.  HENT:     Head: Normocephalic and atraumatic.  Eyes:     Pupils: Pupils are equal, round, and reactive to light.  Neck:     Musculoskeletal: Normal  range of motion and neck supple.  Cardiovascular:     Rate and Rhythm: Regular rhythm. Tachycardia present.     Heart sounds: Normal heart sounds.  Pulmonary:     Effort: Pulmonary effort is normal. Tachypnea present. No respiratory distress.     Breath sounds: Normal breath sounds. No wheezing or rales.     Comments: Mild tachypnea but no increased work of breathing Chest:     Chest wall:  No tenderness.  Abdominal:     General: Bowel sounds are normal.     Palpations: Abdomen is soft.     Tenderness: There is no abdominal tenderness. There is no guarding or rebound.  Musculoskeletal: Normal range of motion.     Comments: No edema or calf tenderness  Lymphadenopathy:     Cervical: No cervical adenopathy.  Skin:    General: Skin is warm and dry.     Findings: No rash.  Neurological:     Mental Status: He is alert and oriented to person, place, and time.      ED Treatments / Results  Labs (all labs ordered are listed, but only abnormal results are displayed) Labs Reviewed  BASIC METABOLIC PANEL - Abnormal; Notable for the following components:      Result Value   Potassium 3.4 (*)    Glucose, Bld 100 (*)    Creatinine, Ser 1.34 (*)    Calcium 8.7 (*)    GFR calc non Af Amer 57 (*)    All other components within normal limits  D-DIMER, QUANTITATIVE (NOT AT [facility]) - Abnormal; Notable for the following components:   D-Dimer, Quant 6.00 (*)    All other components within normal limits  TROPONIN I (HIGH SENSITIVITY) - Abnormal; Notable for the following components:   Troponin I (High Sensitivity) 261 (*)    All other components within normal limits  TROPONIN I (HIGH SENSITIVITY) - Abnormal; Notable for the following components:   Troponin I (High Sensitivity) 384 (*)    All other components within normal limits  SARS CORONAVIRUS 2 AG (30 MIN TAT)  SARS CORONAVIRUS 2 (TAT 6-24 HRS)  MRSA PCR SCREENING  CBC  BRAIN NATRIURETIC PEPTIDE  HEPARIN LEVEL (UNFRACTIONATED)  CBC    HEPARIN LEVEL (UNFRACTIONATED)  CBC  CBC    EKG EKG Interpretation  Date/Time:  Saturday March 20 2019 16:13:56 EST Ventricular Rate:  111 PR Interval:    QRS Duration: 99 QT Interval:  319 QTC Calculation: 434 R Axis:   66 Text Interpretation: Sinus tachycardia Inferior infarct, old SINCE LAST TRACING HEART RATE HAS INCREASED Confirmed by Malvin Johns [id number]) on 03/20/2019 4:23:17 PM   Radiology Dg Chest 2 View  Result Date: 03/20/2019 CLINICAL DATA:  58 year old male with shortness of breath and chest pain. EXAM: CHEST - 2 VIEW COMPARISON:  Chest radiograph dated 04/23/2016. FINDINGS: The lungs are clear. There is no pleural effusion pneumothorax. The cardiac silhouette is within normal limits. An accessory azygos fissure noted. No acute osseous pathology. IMPRESSION: No active cardiopulmonary disease. Electronically Signed   By: Anner Crete M.D.   On: 03/20/2019 17:10   Ct Angio Chest Pe W/cm &/or Wo Cm  Result Date: 03/20/2019 CLINICAL DATA:  Elevated D-dimer, shortness of breath. EXAM: CT ANGIOGRAPHY CHEST WITH CONTRAST TECHNIQUE: Multidetector CT imaging of the chest was performed using the standard protocol during bolus administration of intravenous contrast. Multiplanar CT image reconstructions and MIPs were obtained to evaluate the vascular anatomy. CONTRAST:  172mL OMNIPAQUE IOHEXOL 350 MG/ML SOLN COMPARISON:  12/22/2017 FINDINGS: Cardiovascular: Numerous bilateral large and moderate size pulmonary emboli noted in the central left and right pulmonary arteries and extending into the lobar and segmental branches. Elevated RV: LV ratio of 1.3. Aorta is normal caliber. Mediastinum/Nodes: No mediastinal, hilar, or axillary adenopathy. Trachea and esophagus are unremarkable. Thyroid unremarkable. Lungs/Pleura: No confluent airspace opacities or effusions. Upper Abdomen: Imaging into the upper abdomen shows no acute findings. Musculoskeletal: Chest wall soft tissues are  unremarkable. No acute bony abnormality. Review of the MIP images confirms the above findings. IMPRESSION: Positive for acute PE with CT evidence of right heart strain (RV/LV Ratio = 1.3) consistent with at least submassive (intermediate risk) PE. The presence of right heart strain has been associated with an increased risk of morbidity and mortality. Please activate Code PE by paging [phone]. Critical Value/emergent results were called by telephone at the time of interpretation on 03/20/2019 at 7:00 pm to L[place] , who verbally acknowledged these results. Electronically Signed   By: Rolm Baptise M.D.   On: 03/20/2019 19:01    Procedures Procedures (including critical care time)  Medications Ordered in ED Medications  heparin ADULT infusion 100 units/mL (25000 units/252mL sodium chloride 0.45%) (2,200 Units/hr Intravenous Transfusing/Transfer 03/20/19 2146)  Chlorhexidine Gluconate Cloth 2 % PADS 6 each (has no administration in time range)  iohexol (OMNIPAQUE) 350 MG/ML injection 100 mL (100 mLs Intravenous Contrast Given 03/20/19 1836)  heparin bolus via infusion 7,500 Units (7,500 Units Intravenous Bolus from Bag 03/20/19 1944)     Initial Impression / Assessment and Plan / ED Course  I have reviewed the triage vital signs and the nursing notes.  Pertinent labs & imaging results that were available during my care of the patient were reviewed by me and considered in my medical decision making (see chart for details).        Patient is a 58 year old male who presents with shortness of breath and some chest pain.  He is also noted to be tachycardic.  He was found to have bilateral large pulmonary emboli with right heart strain.  He does remain tachycardic but he is not hypotensive and not hypoxic.  He had an elevated troponin which is likely from the right heart strain.  There is no ischemic changes on EKG and he does not have persistent chest pain.  He was started on  heparin.  I initially consulted with pulmonary/critical care and spoke with Dr. Prudencio Burly who recommends patient be admitted to stepdown for now.  I spoke with Dr. Gaylyn Cheers who is excepted the patient for transfer to the stepdown unit at [facility].  CRITICAL CARE Performed by: Malvin Johns Total critical care time: 60 minutes Critical care time was exclusive of separately billable procedures and treating other patients. Critical care was necessary to treat or prevent imminent or life-threatening deterioration. Critical care was time spent personally by me on the following activities: development of treatment plan with patient and/or surrogate as well as nursing, discussions with consultants, evaluation of patient's response to treatment, examination of patient, obtaining history from patient or surrogate, ordering and performing treatments and interventions, ordering and review of laboratory studies, ordering and review of radiographic studies, pulse oximetry and re-evaluation of patient's condition.   Final Clinical Impressions(s) / ED Diagnoses   Final diagnoses:  Acute pulmonary embolism with acute cor pulmonale, unspecified pulmonary embolism type Brent Mcc[facility])    ED Discharge Orders    None       Malvin Johns, MD 03/20/19 2327

## 2019-03-20 NOTE — ED Notes (Signed)
Date and time results received: 03/20/19 1756   Test: trp Critical Value: 261 Name of Provider Notified: [place] Orders Received? Or Actions Taken?: no orders given

## 2019-03-20 NOTE — ED Notes (Signed)
Pt oxygen sats in high 80s. Pt repositioned in bed and sat returned to mid 90s.

## 2019-03-21 ENCOUNTER — Inpatient Hospital Stay (HOSPITAL_COMMUNITY): Payer: 59

## 2019-03-21 DIAGNOSIS — N182 Chronic kidney disease, stage 2 (mild): Secondary | ICD-10-CM

## 2019-03-21 DIAGNOSIS — I2609 Other pulmonary embolism with acute cor pulmonale: Principal | ICD-10-CM

## 2019-03-21 DIAGNOSIS — Z862 Personal history of diseases of the blood and blood-forming organs and certain disorders involving the immune mechanism: Secondary | ICD-10-CM

## 2019-03-21 DIAGNOSIS — I2699 Other pulmonary embolism without acute cor pulmonale: Secondary | ICD-10-CM

## 2019-03-21 DIAGNOSIS — E876 Hypokalemia: Secondary | ICD-10-CM

## 2019-03-21 DIAGNOSIS — R000 Tachycardia, unspecified: Secondary | ICD-10-CM

## 2019-03-21 DIAGNOSIS — I2602 Saddle embolus of pulmonary artery with acute cor pulmonale: Secondary | ICD-10-CM

## 2019-03-21 LAB — BASIC METABOLIC PANEL
Anion gap: 10 (ref 5–15)
BUN: 15 mg/dL (ref 6–20)
CO2: 23 mmol/L (ref 22–32)
Calcium: 9 mg/dL (ref 8.9–10.3)
Chloride: 102 mmol/L (ref 98–111)
Creatinine, Ser: 1.27 mg/dL — ABNORMAL HIGH (ref 0.61–1.24)
GFR calc Af Amer: >60 mL/min (ref >60)
GFR calc non Af Amer: >60 mL/min (ref >60)
Glucose, Bld: 134 mg/dL — ABNORMAL HIGH (ref 70–99)
Potassium: 3.3 mmol/L — ABNORMAL LOW (ref 3.5–5.1)
Sodium: 135 mmol/L (ref 135–145)

## 2019-03-21 LAB — URINALYSIS, ROUTINE W REFLEX MICROSCOPIC
Bacteria, UA: NONE SEEN
Bilirubin Urine: NEGATIVE
Glucose, UA: NEGATIVE mg/dL
Ketones, ur: NEGATIVE mg/dL
Leukocytes,Ua: NEGATIVE
Nitrite: NEGATIVE
Protein, ur: NEGATIVE mg/dL
Specific Gravity, Urine: 1.025 (ref 1.005–1.030)
pH: 6 (ref 5.0–8.0)

## 2019-03-21 LAB — ECHOCARDIOGRAM COMPLETE
Height: 75 in
Weight: 6560 oz

## 2019-03-21 LAB — CBC
HCT: 48.9 % (ref 39.0–52.0)
Hemoglobin: 15.7 g/dL (ref 13.0–17.0)
MCH: 28.8 pg (ref 26.0–34.0)
MCHC: 32.1 g/dL (ref 30.0–36.0)
MCV: 89.7 fL (ref 80.0–100.0)
Platelets: 164 10*3/uL (ref 150–400)
RBC: 5.45 MIL/uL (ref 4.22–5.81)
RDW: 13.3 % (ref 11.5–15.5)
WBC: 7 10*3/uL (ref 4.0–10.5)
nRBC: 0 % (ref 0.0–0.2)

## 2019-03-21 LAB — HIV ANTIBODY (ROUTINE TESTING W REFLEX): HIV Screen 4th Generation wRfx: NONREACTIVE

## 2019-03-21 LAB — CK TOTAL AND CKMB (NOT AT ARMC)
CK, MB: 6.6 ng/mL — ABNORMAL HIGH (ref 0.5–5.0)
Relative Index: 3.1 — ABNORMAL HIGH (ref 0.0–2.5)
Total CK: 210 U/L (ref 49–397)

## 2019-03-21 LAB — SARS CORONAVIRUS 2 (TAT 6-24 HRS): SARS Coronavirus 2: NEGATIVE

## 2019-03-21 LAB — TROPONIN I (HIGH SENSITIVITY)
Troponin I (High Sensitivity): 258 ng/L (ref <18)
Troponin I (High Sensitivity): 151 ng/L (ref <18)
Troponin I (High Sensitivity): 143 ng/L (ref <18)

## 2019-03-21 LAB — HEPARIN LEVEL (UNFRACTIONATED)
Heparin Unfractionated: 0.66 IU/mL (ref 0.30–0.70)
Heparin Unfractionated: 0.67 IU/mL (ref 0.30–0.70)

## 2019-03-21 LAB — MRSA PCR SCREENING: MRSA by PCR: NEGATIVE

## 2019-03-21 MED ORDER — VALSARTAN-HYDROCHLOROTHIAZIDE 320-12.5 MG PO TABS: 1.0000 | ORAL_TABLET | Freq: Every day | ORAL | Status: DC

## 2019-03-21 MED ORDER — POLYETHYLENE GLYCOL 3350 17 G PO PACK: 17.0000 g | PACK | Freq: Every day | ORAL | Status: DC | PRN

## 2019-03-21 MED ORDER — IRBESARTAN 300 MG PO TABS
300.0000 mg | ORAL_TABLET | Freq: Every day | ORAL | Status: DC
  Filled 2019-03-21: qty 1

## 2019-03-21 MED ORDER — METOPROLOL TARTRATE 5 MG/5ML IV SOLN: 2.5000 mg | INTRAVENOUS | Status: DC | PRN

## 2019-03-21 MED ORDER — POTASSIUM CHLORIDE CRYS ER 20 MEQ PO TBCR
40.0000 meq | EXTENDED_RELEASE_TABLET | Freq: Once | ORAL | Status: AC
  Administered 2019-03-21: 40 meq via ORAL
  Filled 2019-03-21: qty 2

## 2019-03-21 MED ORDER — HYDROCHLOROTHIAZIDE 12.5 MG PO CAPS
12.5000 mg | ORAL_CAPSULE | Freq: Every day | ORAL | Status: DC
  Filled 2019-03-21: qty 1

## 2019-03-21 MED ORDER — POTASSIUM CHLORIDE CRYS ER 20 MEQ PO TBCR
40.0000 meq | EXTENDED_RELEASE_TABLET | Freq: Once | ORAL | Status: AC
  Administered 2019-03-21: 02:00:00 40 meq via ORAL
  Filled 2019-03-21: qty 2

## 2019-03-21 MED ORDER — METOPROLOL TARTRATE 5 MG/5ML IV SOLN
5.0000 mg | INTRAVENOUS | Status: DC | PRN
  Administered 2019-03-21: 5 mg via INTRAVENOUS
  Filled 2019-03-21: qty 5

## 2019-03-21 MED ORDER — SODIUM CHLORIDE 0.9 % IV BOLUS
1000.0000 mL | Freq: Once | INTRAVENOUS | Status: AC
  Administered 2019-03-22: 1000 mL via INTRAVENOUS

## 2019-03-21 MED ORDER — ONDANSETRON HCL 4 MG/2ML IJ SOLN: 4.0000 mg | Freq: Four times a day (QID) | INTRAMUSCULAR | Status: DC | PRN

## 2019-03-21 MED ORDER — ZOLPIDEM TARTRATE 5 MG PO TABS
5.0000 mg | ORAL_TABLET | Freq: Every evening | ORAL | Status: DC | PRN
  Administered 2019-03-21 – 2019-03-22 (×2): 5 mg via ORAL
  Filled 2019-03-21 (×2): qty 1

## 2019-03-21 MED ORDER — ONDANSETRON HCL 4 MG PO TABS: 4.0000 mg | ORAL_TABLET | Freq: Four times a day (QID) | ORAL | Status: DC | PRN

## 2019-03-21 MED ORDER — PERFLUTREN LIPID MICROSPHERE
1.0000 mL | INTRAVENOUS | Status: AC | PRN
  Administered 2019-03-21: 2 mL via INTRAVENOUS
  Filled 2019-03-21: qty 10

## 2019-03-21 MED ORDER — AMLODIPINE BESYLATE 10 MG PO TABS
10.0000 mg | ORAL_TABLET | Freq: Every day | ORAL | Status: DC
  Filled 2019-03-21: qty 1

## 2019-03-21 MED ORDER — LORATADINE 10 MG PO TABS: 10.0000 mg | ORAL_TABLET | Freq: Every day | ORAL | Status: DC | PRN

## 2019-03-21 NOTE — Progress Notes (Signed)
  Echocardiogram 2D Echocardiogram has been performed.  Merrie Roof F 03/21/2019, 12:45 PM

## 2019-03-21 NOTE — Progress Notes (Addendum)
PROGRESS NOTE  Brent Mccoy YPP:[id number] DOB: [birth year] DOA: 03/20/2019 PCP: Harlan Stains, MD  HPI/Recap of past 24 hours:  He is on heparin drip, does has sinus tachycardia He reported intermittent chest pain last night ,currently denies chest pain, reports feeling better No hypoxia, no hypotension at rest He denies lower extremity edema, no hemoptysis  Assessment/Plan: Active Problems:   Pulmonary emboli ([place])   Pulmonary embolism ([place])  Acute bilateral PE -He presents to the ED due to feeling short of breath and chest pain, he thought it is due to indigestion -CTA on presentation" Numerous bilateral large and moderate size pulmonary emboli noted in the central left and right pulmonary arteries and extending into the lobar and segmental branches. Elevated RV: LV ratio of 1.3. Aorta is normal caliber." -Echo left ventricular ejection fraction 60 to 65%.  Moderately increased left ventricular hypertrophy, grade 1 diastolic dysfunction of left ventricular diastolic parameters. Right Ventricle: The right ventricular size is normal. No increase in right ventricular wall thickness. Global RV systolic function is has low normal systolic function. -High-sensitivity troponin peaked at 384, trending down to 143,I  was reached by cardiologist on call who thinks elevated troponin is due to PE, will defer cardiology consult for now -venous doppler ordered on admission, pending -Is started on heparin drip and admitted to stepdown/ICU unit -He has persistent sinus tachycardia, likely compensating for PE, he currently has no hypoxia or hypotension at rest, critical care on board, will defer decision on thrombolysis to critical care , critical care input appreciated.  Sinus tachycardia due to PE -Did not tolerate beta-blocker last night   H/o DVT left lower extremity in 12/2017, was treated with Eliquis for about 30 to 4 to 5 days then he was taken off of it and started on  aspirin.   Hypokalemia, replace, recheck in a.m.  Grade 1 diastolic dysfunction on echocardiogram with left ventricular wall hypertrophy -Home meds Diovan HCTZ currently on hold due to acute PE -No edema, no hypoxia currently  HTN: Home home bp meds in the setting of acute submassive PE  CKD 2,  Creatinine appears at baseline UA unremarkable Renal dosing meds  H/o Sarcoidosis, does not appear to be on any medication at home , CTA chest on presentation did not mention any adenopathy or lung parenchymal changes He is followed by Dr Annamaria Boots    Body mass index is 51.25 kg/m. meet criteria for class III obesity OSA on cpap at home , he is followed by Dr Annamaria Boots  Patient reports intentional weight loss of 70lbs the past yr  DVT Prophylaxis: On heparin drip  Code Status: Full  Family Communication: patient   Disposition Plan: Remain in stepdown /ICU   Consultants:  Critical care  Procedures:  None  Antibiotics:  None   Objective: BP 113/64    Pulse (!) 130    Temp 97.7 F (36.5 C) (Oral)    Resp (!) 21    Ht _0  (1.905 m)    Wt (!) 186 kg    SpO2 93%    BMI 51.25 kg/m   Intake/Output Summary (Last 24 hours) at 03/21/2019 1008 Last data filed at 03/21/2019 0300 Gross per 24 hour  Intake 226.91 ml  Output 1200 ml  Net -973.09 ml   Filed Weights   03/20/19 1618  Weight: (!) 186 kg    Exam: Patient is examined daily including today on 03/21/2019, exams remain the same as of yesterday except that has changed  General:  NAD  Cardiovascular: Sinus tachycardia  Respiratory: CTABL  Abdomen: Soft/ND/NT, positive BS  Musculoskeletal: No Edema  Neuro: alert, oriented   Data Reviewed: Basic Metabolic Panel: Recent Labs  Lab 03/20/19 1703 03/21/19 0155  NA 137 135  K 3.4* 3.3*  CL 101 102  CO2 25 23  GLUCOSE 100* 134*  BUN 19 15  CREATININE 1.34* 1.27*  CALCIUM 8.7* 9.0   Liver Function Tests: No results for input(s): AST, ALT, ALKPHOS,  BILITOT, PROT, ALBUMIN in the last 168 hours. No results for input(s): LIPASE, AMYLASE in the last 168 hours. No results for input(s): AMMONIA in the last 168 hours. CBC: Recent Labs  Lab 03/20/19 1703 03/21/19 0155  WBC 5.8 7.0  HGB 16.0 15.7  HCT 49.1 48.9  MCV 88.5 89.7  PLT 168 164   Cardiac Enzymes:   Recent Labs  Lab 03/21/19 0155  CKTOTAL 210  CKMB 6.6*   BNP (last 3 results) Recent Labs    03/20/19 1703  BNP 50.6    ProBNP (last 3 results) No results for input(s): PROBNP in the last 8760 hours.  CBG: No results for input(s): GLUCAP in the last 168 hours.  Recent Results (from the past 240 hour(s))  SARS Coronavirus 2 Ag (30 min TAT) - Nasal Swab (BD Veritor Kit)     Status: None   Collection Time: 03/20/19  5:03 PM   Specimen: Nasal Swab (BD Veritor Kit)  Result Value Ref Range Status   SARS Coronavirus 2 Ag NEGATIVE NEGATIVE Final    Comment: (NOTE) SARS-CoV-2 antigen NOT DETECTED.  Negative results are presumptive.  Negative results do not preclude SARS-CoV-2 infection and should not be used as the sole basis for treatment or other patient management decisions, including infection  control decisions, particularly in the presence of clinical signs and  symptoms consistent with COVID-19, or in those who have been in contact with the virus.  Negative results must be combined with clinical observations, patient history, and epidemiological information. The expected result is Negative. Fact Sheet for Patients: PodPark.tn Fact Sheet for Healthcare Providers: GiftContent.is This test is not yet approved or cleared by the [place] FDA and  has been authorized for detection and/or diagnosis of SARS-CoV-2 by FDA under an Emergency Use Authorization (EUA).  This EUA will remain in effect (meaning this test can be used) for the duration of  the COVID-19 de claration under Section 564(b)(1) of the  Act, 21 U.S.C. section 360bbb-3(b)(1), unless the authorization is terminated or revoked sooner. Performed at [facility], [address] [place], [place] [address]   SARS CORONAVIRUS 2 (TAT 6-24 HRS) Nasopharyngeal Nasopharyngeal Swab     Status: None   Collection Time: 03/20/19  6:00 PM   Specimen: Nasopharyngeal Swab  Result Value Ref Range Status   SARS Coronavirus 2 NEGATIVE NEGATIVE Final    Comment: (NOTE) SARS-CoV-2 target nucleic acids are NOT DETECTED. The SARS-CoV-2 RNA is generally detectable in upper and lower respiratory specimens during the acute phase of infection. Negative results do not preclude SARS-CoV-2 infection, do not rule out co-infections with other pathogens, and should not be used as the sole basis for treatment or other patient management decisions. Negative results must be combined with clinical observations, patient history, and epidemiological information. The expected result is Negative. Fact Sheet for Patients: SugarRoll.be Fact Sheet for Healthcare Providers: https://www.woods-mathews.com/ This test is not yet approved or cleared by the [place] FDA and  has been authorized for  detection and/or diagnosis of SARS-CoV-2 by FDA under an Emergency Use Authorization (EUA). This EUA will remain  in effect (meaning this test can be used) for the duration of the COVID-19 declaration under Section 56 4(b)(1) of the Act, 21 U.S.C. section 360bbb-3(b)(1), unless the authorization is terminated or revoked sooner. Performed at [facility], [place] [address], [place], [place] [address]   MRSA PCR Screening     Status: None   Collection Time: 03/20/19 10:50 PM   Specimen: Nasal Mucosa; Nasopharyngeal  Result Value Ref Range Status   MRSA by PCR NEGATIVE NEGATIVE Final    Comment:        The GeneXpert MRSA Assay (FDA approved for NASAL specimens only), is one component of a comprehensive MRSA  colonization surveillance program. It is not intended to diagnose MRSA infection nor to guide or monitor treatment for MRSA infections. Performed at [facility], [place] [address], [place], [place] [address]      Studies: Dg Chest 2 View  Result Date: 03/20/2019 CLINICAL DATA:  58 year old male with shortness of breath and chest pain. EXAM: CHEST - 2 VIEW COMPARISON:  Chest radiograph dated 04/23/2016. FINDINGS: The lungs are clear. There is no pleural effusion pneumothorax. The cardiac silhouette is within normal limits. An accessory azygos fissure noted. No acute osseous pathology. IMPRESSION: No active cardiopulmonary disease. Electronically Signed   By: Anner Crete M.D.   On: 03/20/2019 17:10   Ct Angio Chest Pe W/cm &/or Wo Cm  Result Date: 03/20/2019 CLINICAL DATA:  Elevated D-dimer, shortness of breath. EXAM: CT ANGIOGRAPHY CHEST WITH CONTRAST TECHNIQUE: Multidetector CT imaging of the chest was performed using the standard protocol during bolus administration of intravenous contrast. Multiplanar CT image reconstructions and MIPs were obtained to evaluate the vascular anatomy. CONTRAST:  168m OMNIPAQUE IOHEXOL 350 MG/ML SOLN COMPARISON:  12/22/2017 FINDINGS: Cardiovascular: Numerous bilateral large and moderate size pulmonary emboli noted in the central left and right pulmonary arteries and extending into the lobar and segmental branches. Elevated RV: LV ratio of 1.3. Aorta is normal caliber. Mediastinum/Nodes: No mediastinal, hilar, or axillary adenopathy. Trachea and esophagus are unremarkable. Thyroid unremarkable. Lungs/Pleura: No confluent airspace opacities or effusions. Upper Abdomen: Imaging into the upper abdomen shows no acute findings. Musculoskeletal: Chest wall soft tissues are unremarkable. No acute bony abnormality. Review of the MIP images confirms the above findings. IMPRESSION: Positive for acute PE with CT evidence of right heart strain (RV/LV Ratio  = 1.3) consistent with at least submassive (intermediate risk) PE. The presence of right heart strain has been associated with an increased risk of morbidity and mortality. Please activate Code PE by paging [phone] Critical Value/emergent results were called by telephone at the time of interpretation on 03/20/2019 at 7:00 pm to [email or web address], who verbally acknowledged these results. Electronically Signed   By: KRolm BaptiseM.D.   On: 03/20/2019 19:01    Scheduled Meds:  amLODipine  10 mg Oral Daily   Chlorhexidine Gluconate Cloth  6 each Topical Daily   irbesartan  300 mg Oral Daily   And   hydrochlorothiazide  12.5 mg Oral Daily    Continuous Infusions:  heparin 2,200 Units/hr (03/21/19 0411)     Time spent: 356ms I have personally reviewed and interpreted on  03/21/2019 daily labs, tele strips, imagings as discussed above under date review session and assessment and plans.  I reviewed all nursing notes, pharmacy notes, consultant notes,  vitals, pertinent old records  I have discussed  plan of care as described above with RN , patient on 03/21/2019   Florencia Reasons MD, PhD, FACP  [facility]  If 7PM-7AM, please contact night-coverage at [email or web address], password [facility] 03/21/2019, 10:08 AM  LOS: 1 day

## 2019-03-21 NOTE — Progress Notes (Signed)
CRITICAL VALUE ALERT  Critical Value:  Trop 258  Date & Time Notied:  [id number] 03/21/19  Provider Notified: Marcellina Millin  Orders Received/Actions taken: Awaiting new orders

## 2019-03-21 NOTE — Consult Note (Signed)
NAME:  Brent Mccoy, MRN:  [id number], DOB:  [birth year], LOS: 1 ADMISSION DATE:  03/20/2019, CONSULTATION DATE:  03/20/2019 REFERRING MD:  Juliet Rude, CHIEF COMPLAINT:  PE   Brief History   58 yo with chest pain and sob, PE by CT  History of present illness   Patient is a 58 year old male who presented to [facility] emergency room yesterday afternoon with acute onset of chest pain.  He has a prior history of DVT for which he takes Eliquis for about 30 or 45 days sometime last year.  After this was discontinued he was started on aspirin.  He has noticed increasing shortness of breath over the last 2 to 3 days with mild exercise.  He also had complaints of centrally located chest pain that was nonradiating.  He has a prior medical history pertinent for sarcoidosis and hypertension.  CT of the chest showed bilateral moderate PEs with some right ventricular strain by CT scan.  D-dimer was 6 otherwise laboratories unremarkable. Patient did have an episode of bradycardia related to a dose of metoprolol given earlier this am for tachycardia.  Bradycardia is resolved now but repeat ekg has new anterior T wave changes.  Will obtain serial troponins.  Past Medical History   . Allergic rhinitis   . DJD (degenerative joint disease)   . GERD (gastroesophageal reflux disease)    hx of - not current  . Hypertension    takes Losartan and Amlodipine daily  . Insomnia    takes Ambien nightly as needed  . Sarcoidosis   . Sleep apnea      Significant Hospital Events   NA  Consults:  NA  Procedures:  NA  Significant Diagnostic Tests:  CT of the chest at time of admission  Micro Data:  NA  Antimicrobials:  NA  Interim history/subjective:  NA  Objective   Blood pressure (!) 127/93, pulse 80, temperature 98 F (36.7 C), temperature source Oral, resp. rate 15, height 6\' 3"  (1.905 m), weight (!) 186 kg, SpO2 98 %.        Intake/Output Summary (Last 24 hours) at 03/21/2019  0547 Last data filed at 03/21/2019 0300 Gross per 24 hour  Intake 226.91 ml  Output 1200 ml  Net -973.09 ml   Filed Weights   03/20/19 1618  Weight: (!) 186 kg    Examination: General: Well developed male no distress HENT: WNL Lungs: Clear Cardiovascular: Reg Abdomen: rotund, benign Extremities: WNL Neuro: Awake alert non focal GU: NA  Resolved Hospital Problem list   NA  Assessment & Plan:  1.  Acute DVT without hypotension or shock: Continue full dose heparin  2.  Obstructive sleep apnea: compliant at home on hospital CPAP here  3.  History of sarcoidosis: No New therapeutic  4.  Questionable anterior ischemia by EKG, will check troponins  Best practice:  Diet: Regular Pain/Anxiety/Delirium protocol (if indicated): As needed VAP protocol (if indicated): N/A DVT prophylaxis: Full dose heparin anticoagulation GI prophylaxis: N/A Glucose control: Monitor Mobility: Bedrest for 24 hours Code Status: Full Family Communication: N/A DispositionElvina Sidle, ICU  Labs   CBC: Recent Labs  Lab 03/20/19 1703 03/21/19 0155  WBC 5.8 7.0  HGB 16.0 15.7  HCT 49.1 48.9  MCV 88.5 89.7  PLT 168 [id number]    Basic Metabolic Panel: Recent Labs  Lab 03/20/19 1703 03/21/19 0155  NA 137 135  K 3.4* 3.3*  CL 101 102  CO2 25 23  GLUCOSE 100* 134*  BUN  19 15  CREATININE 1.34* 1.27*  CALCIUM 8.7* 9.0   GFR: Estimated Creatinine Clearance: 109.4 mL/min (A) (by C-G formula based on SCr of 1.27 mg/dL (H)). Recent Labs  Lab 03/20/19 1703 03/21/19 0155  WBC 5.8 7.0    Liver Function Tests: No results for input(s): AST, ALT, ALKPHOS, BILITOT, PROT, ALBUMIN in the last 168 hours. No results for input(s): LIPASE, AMYLASE in the last 168 hours. No results for input(s): AMMONIA in the last 168 hours.  ABG No results found for: PHART, PCO2ART, PO2ART, HCO3, TCO2, ACIDBASEDEF, O2SAT   Coagulation Profile: No results for input(s): INR, PROTIME in the last 168 hours.   Cardiac Enzymes: Recent Labs  Lab 03/21/19 0155  CKTOTAL 210  CKMB 6.6*    HbA1C: No results found for: HGBA1C  CBG: No results for input(s): GLUCAP in the last 168 hours.  Review of Systems:   Questioned 12 points, no complaints other than Chest pain and sob.  Past Medical History  He,  has a past medical history of Allergic rhinitis, DJD (degenerative joint disease), GERD (gastroesophageal reflux disease), Hypertension, Insomnia, Sarcoidosis, and Sleep apnea.   Surgical History    Past Surgical History:  Procedure Laterality Date  . COLONOSCOPY    . 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 He has never used smokeless tobacco. He reports previous alcohol use. He reports that he does not use drugs.   Family History   His family history includes Rectal cancer in his father.   Allergies No Known Allergies   Home Medications  Prior to Admission medications   Medication Sig Start Date End Date Taking? Authorizing Provider  amLODipine (NORVASC) 10 MG tablet Take 10 mg by mouth daily.     Yes [provider]  loratadine (CLARITIN) 10 MG tablet Take 10 mg by mouth daily as needed for allergies.   Yes [provider]  NON FORMULARY Take 1,000 mg by mouth as needed (sexual enhancement). epimedium   Yes [provider]  NON FORMULARY cpap   Yes [provider]  valsartan-hydrochlorothiazide (DIOVAN-HCT) 320-12.5 MG tablet Take 1 tablet by mouth daily.   Yes [provider]  vitamin E (VITAMIN E) 400 UNIT capsule Take 400 Units by mouth daily.     Yes [provider]  azithromycin (ZITHROMAX) 250 MG tablet Start with 2 tablets today, then 1 daily thereafter. 01/18/18   Jaynee Eagles, PA-C  diphenhydrAMINE (BENADRYL) 50 MG tablet Take 50 mg by mouth at bedtime as needed for sleep.    [provider]  ELIQUIS STARTER PACK (ELIQUIS STARTER PACK) 5 MG TABS Take as directed on package: start with two-5mg  tablets twice daily for 7 days. On day 8, switch to one-5mg  tablet twice daily. 12/22/17   Lorin Glass, PA-C  Ginkgo Biloba [facility]) 40 MG TABS Take 1 tablet by mouth 3 (three) times a week.     [provider]  HYDROcodone-homatropine (HYCODAN) 5-1.5 MG/5ML syrup Take 5 mLs by mouth at bedtime as needed. 01/18/18   Jaynee Eagles, PA-C  Omega-3 Fatty Acids (FISH OIL) 1000 MG CAPS Take 1 capsule by mouth daily.      [provider]  predniSONE (DELTASONE)  20 MG tablet Take 2 tablets daily with breakfast. 01/18/18   Jaynee Eagles, PA-C  traMADol (ULTRAM) 50 MG tablet 1-2 tabs every 8 hours if needed for cough Patient not taking: Reported on 01/20/2018 04/23/16   Deneise Lever, MD  zolpidem (AMBIEN) 10 MG tablet Take 10 mg by mouth at bedtime as needed for sleep.    [provider]     Critical care time: Over 35 minutes was spent in bedside evaluation, chart review and discussion with care team along with critical care planning.

## 2019-03-21 NOTE — Progress Notes (Signed)
ANTICOAGULATION CONSULT NOTE   Pharmacy Consult for Heparin  Indication: pulmonary embolus  No Known Allergies  Patient Measurements: Height: 6\' 3"  (190.5 cm) Weight: (!) 410 lb (186 kg) IBW/kg (Calculated) : 84.5 Heparin Dosing Weight: 129.7 kg   Vital Signs: Temp: 97.7 F (36.5 C) (11/29 0800) Temp Source: Oral (11/29 0800) BP: 109/79 (11/29 1130) Pulse Rate: 134 (11/29 1130)  Labs: Recent Labs    03/20/19 1703  03/21/19 0155 03/21/19 0339 03/21/19 0619 03/21/19 0754 03/21/19 1130  HGB 16.0  --  15.7  --   --   --   --   HCT 49.1  --  48.9  --   --   --   --   PLT 168  --  164  --   --   --   --   HEPARINUNFRC  --   --  0.66  --   --   --  0.67  CREATININE 1.34*  --  1.27*  --   --   --   --   CKTOTAL  --   --  210  --   --   --   --   CKMB  --   --  6.6*  --   --   --   --   TROPONINIHS 261*   < >  --  258* 151* 143*  --    < > = values in this interval not displayed.    Estimated Creatinine Clearance: 109.4 mL/min (A) (by C-G formula based on SCr of 1.27 mg/dL (H)).   Medical History: Past Medical History:  Diagnosis Date  . Allergic rhinitis   . DJD (degenerative joint disease)   . GERD (gastroesophageal reflux disease)    hx of - not current  . Hypertension    takes Losartan and Amlodipine daily  . Insomnia    takes Ambien nightly as needed  . Sarcoidosis   . Sleep apnea     Medications:  Scheduled:   Assessment: Patient is a 55 yom that presented to the ED with chest pain & shortness of breath. The patient has a hx of a DVT last year.  Per patient, he was on Eliquis for a little over a month then switched to aspirin.  Chest CT + bilateral PE with rt heart strain.  Pharmacy has been consulted to dose heparin in this patient.  Baseline CBC WNL.   03/21/2019:  Confirmatory heparin level therapeutic (0.67) on heparin 2200 units/hr  CBC WNL  No bleeding or infusion related issues reported by RN  Goal of Therapy:  Heparin level 0.3-0.7  units/ml Monitor platelets by anticoagulation protocol: Yes   Plan:  - Continue heparin drip at 2200 units/hr - Monitor for s/s of bleeding  - Daily heparin level & CBC while on heparin  - F/U long term anticoagulation plans  Netta Cedars PharmD, BCPS 03/21/2019,12:41 PM

## 2019-03-21 NOTE — Progress Notes (Signed)
Pt had a bradycardic episode after 5 mg IV metoprolol was given. Prior to this, Pt's HR was sustained in the 130's. Metoprolol administered at 0437, bringing HR down to 120's. At 0454 pt's HR immediately dropped into the 30's. Patient became diaphoretic and unable to articulate words. BP taken was 119/93. After a brief period of time, patient became more alert, oriented x4, complaints of feeling clammy. Provider notified, will continue to monitor.

## 2019-03-21 NOTE — Progress Notes (Signed)
ANTICOAGULATION CONSULT NOTE   Pharmacy Consult for Heparin  Indication: pulmonary embolus  No Known Allergies  Patient Measurements: Height: 6\' 3"  (190.5 cm) Weight: (!) 410 lb (186 kg) IBW/kg (Calculated) : 84.5 Heparin Dosing Weight: 129.7 kg   Vital Signs: Temp: 98 F (36.7 C) (11/29 0037) Temp Source: Oral (11/29 0037) BP: 140/81 (11/28 2311) Pulse Rate: 109 (11/29 0000)  Labs: Recent Labs    03/20/19 1703 03/20/19 1924 03/21/19 0155  HGB 16.0  --  15.7  HCT 49.1  --  48.9  PLT 168  --  164  HEPARINUNFRC  --   --  0.66  CREATININE 1.34*  --   --   TROPONINIHS 261* 384*  --     Estimated Creatinine Clearance: 103.7 mL/min (A) (by C-G formula based on SCr of 1.34 mg/dL (H)).   Medical History: Past Medical History:  Diagnosis Date  . Allergic rhinitis   . DJD (degenerative joint disease)   . GERD (gastroesophageal reflux disease)    hx of - not current  . Hypertension    takes Losartan and Amlodipine daily  . Insomnia    takes Ambien nightly as needed  . Sarcoidosis   . Sleep apnea     Medications:  Scheduled:   Assessment: Patient is a 69 yom that presented to the ED with chest pain. The patient has a hx of a DVT last year as was on Eliquis for a little over a month per the patient then was taken off and switched to aspirin per the patient. Patient has a PE at this time and pharmacy has been consulted to dose heparin in this patient.  Today, 11/29 0155 HL = 0.66 at goal, no bleeding or IV interuptions reported by RN.  H/H and plts WNL  Goal of Therapy:  Heparin level 0.3-0.7 units/ml Monitor platelets by anticoagulation protocol: Yes   Plan:  - Continue heparin drip at 2200 units/hr -Recheck HL today to confirm - Monitor for s/s of bleeding and CBC daily while on heparin   Dorrene German PharmD 03/21/2019,2:58 AM

## 2019-03-21 NOTE — Progress Notes (Signed)
[place] Progress Note Patient Name: Brent Mccoy DOB: February 25, 1961 MRN: [id number]   Date of Service  03/21/2019  HPI/Events of Note  Called d/t Sinus Tachycardia with HR = 152. HR was = 130 when seen by PCCM earlier today. Extremely reluctant to give B-Blocker d/t episode of bradycardia and hypotension following previous B-Blocker administration.   eICU Interventions  Will order: 1. Bolus with 0.9 NaCl 1 liter IV over 1 hour now.      Intervention Category Major Interventions: Arrhythmia - evaluation and management  Ogechi Kuehnel Eugene 03/21/2019, 11:08 PM

## 2019-03-21 NOTE — Progress Notes (Signed)
Called by nursing, patient tachycardic with heart rate in the 130s.  5 mg IV metoprolol given.  Patient became bradycardic into the 30s.  His blood pressure remained stable.  The patient became diaphoretic and had difficulty word finding.  This was brief in his episode.  Patient heart rate is rebounded and is now mid 80s.  The patient states he still feels a bit diaphoretic and he feels a little bit more short of breath but otherwise has no complaints.  Critical care Dr Prudencio Burly, elink consult placed.  Lopressor DC'd.

## 2019-03-21 NOTE — Plan of Care (Signed)
  Problem: Education: Goal: Knowledge of General Education information will improve Description Including pain rating scale, medication(s)/side effects and non-pharmacologic comfort measures Outcome: Progressing   

## 2019-03-21 NOTE — Progress Notes (Signed)
PCCM Interval Note  S: Primary team notified PCCM team regarding concerns for SBP in the 56s. I examined patient at bedside and he is awake, alert and coherent. Denies shortness of breath or chest pain. Blood pressure cuff was noted to be loosely placed on forearm. Manual systolic blood pressure and repeat BP taken on upper arm with SBP >100.  Blood pressure 113/64, pulse (!) 130, temperature 97.7 F (36.5 C), temperature source Oral, resp. rate (!) 21, height 6\' 3"  (1.905 m), weight (!) 186 kg, SpO2 93 %. On room air.   Physical Exam: General: BMI 51.25. Well-appearing, no acute distress HENT: [place], AT, OP clear, MMM Eyes: EOMI, no scleral icterus Respiratory: Clear to auscultation bilaterally.  No crackles, wheezing or rales Cardiovascular: RRR, -M/R/G, no JVD GI: BS+, soft, nontender Extremities:-Edema,-tenderness Neuro: AAO x4, CNII-XII grossly intact Psych: Normal mood, normal affect  Data Reviewed: CTA 03/20/19 - Bilateral PE present  Assessment/Plan: Submassive bilateral pulmonary emboli - Patient hemodynamically stable. Denies any prior history of CVA (hemorrhagic, non-hemorrhagic), cancer, bleeding. He would be an appropriate candidate for TPA if needed however this is not indicated at this time.  OSA - Continue CPAP hs  Discussed plan with primary team and bedside RN  Rodman Pickle, M.D. [facility] Pulmonary/Critical Care Medicine 03/21/2019 10:03 AM   Additional CC time: 31 minutes

## 2019-03-22 ENCOUNTER — Inpatient Hospital Stay (HOSPITAL_COMMUNITY): Payer: 59

## 2019-03-22 DIAGNOSIS — I2694 Multiple subsegmental pulmonary emboli without acute cor pulmonale: Secondary | ICD-10-CM

## 2019-03-22 DIAGNOSIS — I495 Sick sinus syndrome: Secondary | ICD-10-CM

## 2019-03-22 DIAGNOSIS — R079 Chest pain, unspecified: Secondary | ICD-10-CM

## 2019-03-22 DIAGNOSIS — R7989 Other specified abnormal findings of blood chemistry: Secondary | ICD-10-CM

## 2019-03-22 DIAGNOSIS — N183 Chronic kidney disease, stage 3 unspecified: Secondary | ICD-10-CM

## 2019-03-22 DIAGNOSIS — I5032 Chronic diastolic (congestive) heart failure: Secondary | ICD-10-CM

## 2019-03-22 DIAGNOSIS — R0789 Other chest pain: Secondary | ICD-10-CM

## 2019-03-22 DIAGNOSIS — I2699 Other pulmonary embolism without acute cor pulmonale: Secondary | ICD-10-CM

## 2019-03-22 LAB — COMPREHENSIVE METABOLIC PANEL
ALT: 31 U/L (ref 0–44)
AST: 25 U/L (ref 15–41)
Albumin: 3.1 g/dL — ABNORMAL LOW (ref 3.5–5.0)
Alkaline Phosphatase: 46 U/L (ref 38–126)
Anion gap: 10 (ref 5–15)
BUN: 22 mg/dL — ABNORMAL HIGH (ref 6–20)
CO2: 22 mmol/L (ref 22–32)
Calcium: 8.7 mg/dL — ABNORMAL LOW (ref 8.9–10.3)
Chloride: 106 mmol/L (ref 98–111)
Creatinine, Ser: 1.44 mg/dL — ABNORMAL HIGH (ref 0.61–1.24)
GFR calc Af Amer: >60 mL/min (ref >60)
GFR calc non Af Amer: 52 mL/min — ABNORMAL LOW (ref >60)
Glucose, Bld: 115 mg/dL — ABNORMAL HIGH (ref 70–99)
Potassium: 3.9 mmol/L (ref 3.5–5.1)
Sodium: 138 mmol/L (ref 135–145)
Total Bilirubin: 1 mg/dL (ref 0.3–1.2)
Total Protein: 6.8 g/dL (ref 6.5–8.1)

## 2019-03-22 LAB — CBC
HCT: 46.6 % (ref 39.0–52.0)
Hemoglobin: 14.9 g/dL (ref 13.0–17.0)
MCH: 28.9 pg (ref 26.0–34.0)
MCHC: 32 g/dL (ref 30.0–36.0)
MCV: 90.3 fL (ref 80.0–100.0)
Platelets: 161 10*3/uL (ref 150–400)
RBC: 5.16 MIL/uL (ref 4.22–5.81)
RDW: 13.3 % (ref 11.5–15.5)
WBC: 6.6 10*3/uL (ref 4.0–10.5)
nRBC: 0 % (ref 0.0–0.2)

## 2019-03-22 LAB — HEPARIN LEVEL (UNFRACTIONATED)
Heparin Unfractionated: 0.69 IU/mL (ref 0.30–0.70)
Heparin Unfractionated: 0.53 IU/mL (ref 0.30–0.70)

## 2019-03-22 LAB — MAGNESIUM: Magnesium: 2 mg/dL (ref 1.7–2.4)

## 2019-03-22 MED ORDER — ADENOSINE 6 MG/2ML IV SOLN
INTRAVENOUS | Status: AC
  Administered 2019-03-22: 12 mg
  Filled 2019-03-22: qty 4

## 2019-03-22 MED ORDER — HEPARIN (PORCINE) 25000 UT/250ML-% IV SOLN
2150.0000 [IU]/h | INTRAVENOUS | Status: DC
  Administered 2019-03-22 – 2019-03-23 (×3): 2150 [IU]/h via INTRAVENOUS
  Filled 2019-03-22 (×2): qty 250

## 2019-03-22 MED ORDER — SODIUM CHLORIDE 0.9 % IV BOLUS
1000.0000 mL | Freq: Once | INTRAVENOUS | Status: AC
  Administered 2019-03-22: 1000 mL via INTRAVENOUS

## 2019-03-22 NOTE — Consult Note (Signed)
Cardiology Consultation:   Patient ID: [name]; [id number]; 1960/11/25   Admit date: 03/20/2019 Date of Consult: 03/22/2019  Primary Care Provider: Harlan Stains, MD Primary Cardiologist: New to [facility]   Patient Profile:   Brent Mccoy is a 58 y.o. male with a hx of hypertension, DJD, sarcoidosis, sleep apnea compliant with CPAP and history of LLE DVT in 2019 treated with NOAC (6 months) with transition to ASA who is being seen today for the evaluation of tachycardia (intolerant to BB therapy) at the request of Dr. Drucilla Schmidt.  History of Present Illness:   [name] is a 58 year old male with a history stated above who presented to [facility] emergency department on 03/20/2019 with a one week hx of DOE and associated chest heaviness found to have a submassive bilateral PE per CTA on presentation.  In the ED, patient was started on heparin gtt and admitted to stepdown/ICU. Echocardiogram revealed an LVEF of 60 to 65% with moderate LVH, grade 1 DD and no right ventricular strain. High-sensitivity troponin peaked was elevated at [phone]. He has been persistently tachycardic since admission with HR's in the 150 range. Given this, he was started on metoprolol with subsequent symptomatic bradycardia with HR's in the 30's. Critical care team has been consulted for transient hypotension with SBP's in the 70s however more recent blood pressures have been soft but WNL. BP is now improved to 142/98 however heart rate has remained elevated in the 140s to 150s.  On my interview, he denies recurrent chest heaviness since admission. He is maintaining stable oxygen saturations and denies palpitations or SOB. He states that his baseline HR is in the upper 90's to low 100's normally. He is very reluctant to restart other HR lowering medications at this time. We discussed holding off on therapies for now given that he is asymptomatic however may need to readdress if HRs do not begin to  fall within the next day or two. He has no prior hx of CAD or other cardiac issues. He is followed by his PCP for regular care. Reports medication and CPAP compliance. He is currently on airborne precautions secondary to wife's possible exposure. Pts COVID testing has been negative this admission.  Cardiology has been asked to evaluate given sustained sinus tachycardia.   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 Authorizing Provider  amLODipine (NORVASC) 10 MG tablet Take 10 mg by mouth daily.     Yes [provider]  loratadine (CLARITIN) 10 MG tablet Take 10 mg by mouth daily as needed for allergies.   Yes [provider]  NON FORMULARY Take 1,000 mg by mouth as needed (sexual enhancement). epimedium   Yes [provider]  NON  FORMULARY cpap   Yes [provider]  valsartan-hydrochlorothiazide (DIOVAN-HCT) 320-12.5 MG tablet Take 1 tablet by mouth daily.   Yes [provider]  vitamin E (VITAMIN E) 400 UNIT capsule Take 400 Units by mouth daily.     Yes [provider]  azithromycin (ZITHROMAX) 250 MG tablet Start with 2 tablets today, then 1 daily thereafter. 01/18/18   Jaynee Eagles, PA-C  diphenhydrAMINE (BENADRYL) 50 MG tablet Take 50 mg by mouth at bedtime as needed for sleep.    [provider]  ELIQUIS STARTER PACK (ELIQUIS STARTER PACK) 5 MG TABS Take as directed on package: start with two-5mg  tablets twice daily for 7 days. On day 8, switch to one-5mg  tablet twice daily. 12/22/17   Lorin Glass, PA-C  Ginkgo Biloba [facility]) 40 MG TABS Take 1 tablet by mouth 3 (three) times a week.     [provider]  HYDROcodone-homatropine (HYCODAN) 5-1.5 MG/5ML syrup Take 5 mLs by mouth at bedtime as needed. 01/18/18   Jaynee Eagles, PA-C  Omega-3 Fatty Acids (FISH OIL) 1000 MG CAPS Take 1 capsule by mouth daily.      [provider]  predniSONE (DELTASONE) 20 MG tablet Take 2 tablets daily with breakfast. 01/18/18   Jaynee Eagles, PA-C  traMADol (ULTRAM) 50 MG tablet 1-2 tabs every 8 hours if needed for cough Patient not taking: Reported on 01/20/2018 04/23/16   Deneise Lever, MD  zolpidem (AMBIEN) 10 MG tablet Take 10 mg by mouth at bedtime as needed for sleep.    [provider]    Inpatient Medications: Scheduled Meds:  Chlorhexidine Gluconate Cloth  6 each Topical Daily   Continuous Infusions:  heparin 2,150 Units/hr (03/22/19 1000)   PRN Meds: loratadine, ondansetron **OR** ondansetron (ZOFRAN) IV, polyethylene glycol, zolpidem  Allergies:   No Known Allergies  Social History:   Social History   Socioeconomic History   Marital status: Married    Spouse name: Not on file   Number of children: 1   Years of education: Not on file   Highest education level: Not on file  Occupational History   Not on file  Social Needs   Financial resource strain: Not on file   Food insecurity    Worry: Not on file    Inability: Not on file   Transportation needs    Medical: Not on file    Non-medical: Not on file  Tobacco Use   Smoking status: Never Smoker   Smokeless tobacco: Never Used  Substance and Sexual Activity   Alcohol use: Not Currently   Drug use: No   Sexual activity: Not on file  Lifestyle   Physical activity     Days per week: Not on file    Minutes per session: Not on file   Stress: Not on file  Relationships   Social connections    Talks on phone: Not on file    Gets together: Not on file    Attends religious service: Not on file    Active member of club or organization: Not on file    Attends meetings of clubs or organizations: Not on file    Relationship status: Not on file   Intimate partner violence    Fear of current or ex partner: Not on file    Emotionally abused: Not on file    Physically abused: Not on file    Forced sexual activity: Not on file  Other Topics Concern   Not on  file  Social History Narrative   Not on file    Family History:   Family History  Problem Relation Age of Onset   Rectal cancer Father    Family Status:  Family Status  Relation Name Status   Mother  93       age 84   Other  70       sibling age 31   Other  89       sibling age 45   Other  69       sibling age 62   Other  Alive       sibling age 44   Father  Deceased at age 6       rectal cancer    ROS:  Please see the history of present illness.  All other ROS reviewed and negative.     Physical Exam/Data:   Vitals:   03/22/19 0736 03/22/19 0800 03/22/19 0900 03/22/19 1000  BP: (!) 142/98 115/84    Pulse: (!) 147 (!) 145 (!) 152 (!) 147  Resp: 17 20 (!) 21 (!) 25  Temp:  97.6 F (36.4 C)    TempSrc:  Oral    SpO2: 98% 97% 95% 98%  Weight:      Height:        Intake/Output Summary (Last 24 hours) at 03/22/2019 1151 Last data filed at 03/22/2019 1100 Gross per 24 hour  Intake 2673.26 ml  Output 1300 ml  Net 1373.26 ml   Filed Weights   03/20/19 1618  Weight: (!) 186 kg   Body mass index is 51.25 kg/m.   General: Obese, NAD Neck: Negative for carotid bruits. No JVD Lungs:Clear to ausculation bilaterally. No wheezes. Breathing is unlabored. Cardiovascular: RRR with S1 S2. No murmurs Abdomen: Soft, non-tender, distended. No obvious abdominal  masses. Extremities: No edema. No clubbing or cyanosis. DP pulses 1+ bilaterally Neuro: Alert and oriented. No focal deficits. No facial asymmetry. MAE spontaneously. Psych: Responds to questions appropriately with normal affect.    EKG:  The EKG was personally reviewed and demonstrates:  NSR, inferior Q waves, prolonged QTc Telemetry:  Telemetry was personally reviewed and demonstrates:  ST with HR in the 150's, no bradycardia   Relevant CV Studies:  Echocardiogram 03/21/2019:  1. Left ventricular ejection fraction, by visual estimation, is 60 to 65%. The left ventricle has normal function. Left ventricular septal wall thickness was moderately increased. Moderately increased left ventricular posterior wall thickness. There is  moderately increased left ventricular hypertrophy.  2. Definity contrast agent was given IV to delineate the left ventricular endocardial borders.  3. Left ventricular diastolic parameters are consistent with Grade I diastolic dysfunction (impaired relaxation).  4. Global right ventricle has low normal systolic function.The right ventricular size is normal. No increase in right ventricular wall thickness.  5. Left atrial size was normal.  6. Right atrial size was normal.  7. The mitral valve is normal in structure. Trace mitral valve regurgitation. No evidence of mitral stenosis.  8. The tricuspid valve is normal in structure. Tricuspid valve regurgitation is not demonstrated.  9. The aortic valve is tricuspid. Aortic valve regurgitation is not visualized. No evidence of aortic valve sclerosis or stenosis. 10. The pulmonic valve was normal in structure. Pulmonic valve regurgitation is not visualized. 11. TR signal is inadequate for assessing pulmonary artery systolic pressure. 12. The inferior vena cava is dilated in size with <50% respiratory variability, suggesting right atrial pressure of 15 mmHg.  Laboratory Data:  Chemistry Recent Labs  Lab 03/20/19 1703  03/21/19 0155 03/22/19 0228  NA 137 135 138  K 3.4* 3.3* 3.9  CL 101 102 106  CO2 25 23 22   GLUCOSE 100* 134* 115*  BUN 19 15 22*  CREATININE 1.34* 1.27* 1.44*  CALCIUM 8.7* 9.0 8.7*  GFRNONAA 57* >60 52*  GFRAA >60 >60 >60  ANIONGAP 11 10 10     Total Protein  Date Value Ref Range Status  03/22/2019 6.8 6.5 - 8.1 g/dL Final   Albumin  Date Value Ref Range Status  03/22/2019 3.1 (L) 3.5 - 5.0 g/dL Final   AST  Date Value Ref Range Status  03/22/2019 25 15 - 41 U/L Final   ALT  Date Value Ref Range Status  03/22/2019 31 0 - 44 U/L Final   Alkaline Phosphatase  Date Value Ref Range Status  03/22/2019 46 38 - 126 U/L Final   Total Bilirubin  Date Value Ref Range Status  03/22/2019 1.0 0.3 - 1.2 mg/dL Final   Hematology Recent Labs  Lab 03/20/19 1703 03/21/19 0155 03/22/19 0228  WBC 5.8 7.0 6.6  RBC 5.55 5.45 5.16  HGB 16.0 15.7 14.9  HCT 49.1 48.9 46.6  MCV 88.5 89.7 90.3  MCH 28.8 28.8 28.9  MCHC 32.6 32.1 32.0  RDW 13.2 13.3 13.3  PLT 168 164 161   Cardiac EnzymesNo results for input(s): TROPONINI in the last 168 hours. No results for input(s): TROPIPOC in the last 168 hours.  BNP Recent Labs  Lab 03/20/19 1703  BNP 50.6    DDimer  Recent Labs  Lab 03/20/19 1703  DDIMER 6.00*   TSH: No results found for: TSH Lipids:No results found for: CHOL, HDL, LDLCALC, LDLDIRECT, TRIG, CHOLHDL HgbA1c:No results found for: HGBA1C  Radiology/Studies:  Dg Chest 2 View  Result Date: 03/20/2019 CLINICAL DATA:  58 year old male with shortness of breath and chest pain. EXAM: CHEST - 2 VIEW COMPARISON:  Chest radiograph dated 04/23/2016. FINDINGS: The lungs are clear. There is no pleural effusion pneumothorax. The cardiac silhouette is within normal limits. An accessory azygos fissure noted. No acute osseous pathology. IMPRESSION: No active cardiopulmonary disease. Electronically Signed   By: Anner Crete M.D.   On: 03/20/2019 17:10   Ct Angio Chest Pe W/cm  &/or Wo Cm  Result Date: 03/20/2019 CLINICAL DATA:  Elevated D-dimer, shortness of breath. EXAM: CT ANGIOGRAPHY CHEST WITH CONTRAST TECHNIQUE: Multidetector CT imaging of the chest was performed using the standard protocol during bolus administration of intravenous contrast. Multiplanar CT image reconstructions and MIPs were obtained to evaluate the vascular anatomy. CONTRAST:  160mL OMNIPAQUE IOHEXOL 350 MG/ML SOLN COMPARISON:  12/22/2017 FINDINGS: Cardiovascular: Numerous bilateral large and moderate size pulmonary emboli noted in the central left and right pulmonary arteries and extending into the lobar and segmental branches. Elevated RV: LV ratio of 1.3. Aorta is normal caliber. Mediastinum/Nodes: No mediastinal, hilar, or axillary adenopathy. Trachea and esophagus are unremarkable. Thyroid unremarkable. Lungs/Pleura: No confluent airspace opacities or effusions. Upper Abdomen: Imaging into the upper abdomen shows no acute findings. Musculoskeletal: Chest wall soft tissues are unremarkable. No acute bony abnormality. Review of the MIP images confirms the above findings. IMPRESSION: Positive for acute PE with CT evidence of right heart strain (RV/LV Ratio = 1.3) consistent with at least submassive (intermediate risk) PE. The presence of right heart strain has been associated with an increased risk of morbidity and mortality. Please activate Code PE by paging [phone]. Critical Value/emergent results were called by telephone at  the time of interpretation on 03/20/2019 at 7:00 pm to [place] , who verbally acknowledged these results. Electronically Signed   By: Rolm Baptise M.D.   On: 03/20/2019 19:01   Vas [place] Lower Extremity Venous (dvt)  Result Date: 03/22/2019  Lower Venous Study Indications: Pulmonary embolism.  Risk Factors: Confirmed PE. Anticoagulation: Heparin. Limitations: Body habitus and poor ultrasound/tissue interface. Comparison Study: 12/23/2017 - L Gastrocnemius Performing  Technologist: Oliver Hum RVT  Examination Guidelines: A complete evaluation includes B-mode imaging, spectral Doppler, color Doppler, and power Doppler as needed of all accessible portions of each vessel. Bilateral testing is considered an integral part of a complete examination. Limited examinations for reoccurring indications may be performed as noted.  +---------+---------------+---------+-----------+----------+--------------+  RIGHT     Compressibility Phasicity Spontaneity Properties Thrombus Aging  +---------+---------------+---------+-----------+----------+--------------+  CFV       Full            Yes       Yes                                    +---------+---------------+---------+-----------+----------+--------------+  SFJ       Full                                                             +---------+---------------+---------+-----------+----------+--------------+  FV Prox   Full                                                             +---------+---------------+---------+-----------+----------+--------------+  FV Mid    Full                                                             +---------+---------------+---------+-----------+----------+--------------+  FV Distal Full                                                             +---------+---------------+---------+-----------+----------+--------------+  PFV       Full                                                             +---------+---------------+---------+-----------+----------+--------------+  POP       Full            Yes       Yes                                    +---------+---------------+---------+-----------+----------+--------------+  PTV       Full                                                             +---------+---------------+---------+-----------+----------+--------------+  PERO      Full                                                              +---------+---------------+---------+-----------+----------+--------------+   +---------+---------------+---------+-----------+----------+--------------+  LEFT      Compressibility Phasicity Spontaneity Properties Thrombus Aging  +---------+---------------+---------+-----------+----------+--------------+  CFV       Full            Yes       Yes                                    +---------+---------------+---------+-----------+----------+--------------+  SFJ       Full                                                             +---------+---------------+---------+-----------+----------+--------------+  FV Prox   Full                                                             +---------+---------------+---------+-----------+----------+--------------+  FV Mid    Full                                                             +---------+---------------+---------+-----------+----------+--------------+  FV Distal Full                                                             +---------+---------------+---------+-----------+----------+--------------+  PFV       Full                                                             +---------+---------------+---------+-----------+----------+--------------+  POP       Full            Yes       Yes                                    +---------+---------------+---------+-----------+----------+--------------+  PTV       Full                                                             +---------+---------------+---------+-----------+----------+--------------+  PERO      Full                                                             +---------+---------------+---------+-----------+----------+--------------+     Summary: Right: There is no evidence of deep vein thrombosis in the lower extremity. However, portions of this examination were limited- see technologist comments above. No cystic structure found in the popliteal fossa. Left: There is no evidence of deep vein thrombosis  in the lower extremity. However, portions of this examination were limited- see technologist comments above. No cystic structure found in the popliteal fossa.  *See table(s) above for measurements and observations.    Preliminary    Assessment and Plan:   1.  Sustained sinus tachycardia/bradycardia in the setting of acute bilateral submassive PE: -Patient presented with 1 week history of DOE found to have submassive bilateral PE on CTA started on IV heparin. -Echocardiogram with normal LV function and grade 1 DD with no RV strain -Oxygen saturations have remained stable -Has known history of LLE DVT in 2019 in which she was treated with NOAC initially then transitioned to ASA -Will likely need follow-up with hematology in the outpatient setting>> family history of clotting in brother -Trial of low-dose metoprolol initiated 11/28 with severe symptomatic hypotension, bradycardia and near syncope>>BB discontinued with no recurrence  -Telemetry with HR's in the 150 range and no further bradycardic episodes.  -Patient reluctant to start other beta blocking agents -Will likely continue watchful waiting as his baseline HR (patient reported) noted to be elevated in the 90-100 range and ST in the setting of PE>>>does have LVH on echo so will certainly need better HR control prior to d/c  -Could try CCB given stable LV function>>will discuss with attending cardiologist  -TSH, pending  2.  Atypical chest pain with elevated troponin: -Likely in the setting of acute submassive bilateral PE -High-sensitivity troponin initially elevated (258, 151, 143)>>no recurrent chest heaviness since admission  -Echocardiogram with stable LV function and no regional wall motion abnormalities -No further ischemic workup at this time   3.  Hypertension: -Not currently on antihypertensive medication secondary to low normal BP and episodes of bradycardia with beta blocking agents  4.  CKD stage III: -Creatinine, 1.44  today with a remote baseline of 1.1 -Baseline appears to be in the -Continue to avoid nephrotoxic medications -May need OP follow-up with nephrology  5.  OSA: -Compliant with CPAP nightly -Follows OP with pulmonary medicine   6.  Chronic diastolic HF: -Per echocardiogram-with G1 DD -Takes home Diovan/HCTZ currently on hold secondary to acute PE   For questions or updates, please contact [place] Please consult [email or web address] for contact info under Cardiology/STEMI.   SignedKathyrn Drown NP-C [facility] Pager: [phone] 03/22/2019 11:51 AM

## 2019-03-22 NOTE — Progress Notes (Signed)
Bilateral lower extremity venous duplex has been completed. Preliminary results can be found in CV Proc through chart review.   03/22/19 9:49 AM Carlos Levering RVT

## 2019-03-22 NOTE — Progress Notes (Signed)
   NAME:  Brent Mccoy, MRN:  [id number], DOB:  27-Feb-1961, LOS: 2 ADMISSION DATE:  03/20/2019, CONSULTATION DATE:  03/20/2019 REFERRING MD:  Juliet Rude, CHIEF COMPLAINT:  PE   Brief History   58 yo with chest pain and sob, PE by CT  Past Medical History   . Allergic rhinitis   . DJD (degenerative joint disease)   . GERD (gastroesophageal reflux disease)    hx of - not current  . Hypertension    takes Losartan and Amlodipine daily  . Insomnia    takes Ambien nightly as needed  . Sarcoidosis   . Sleep apnea Prior dvt     Significant Hospital Events   NA  Consults:  NA  Procedures:  NA  Significant Diagnostic Tests:  CT of chest 11/28: Submassive bilateral pulmonary emboli with RV/LV ratio of 1.3 Bilateral lower extremity ultrasound 11/30: Negative for DVT however imaging was limited -Echocardiogram noted to have global RV systolic function low/normal.  LVEF 60 to 65%.  Grade 1 diastolic dysfunction.  Normal size of right atrium and right ventricle Micro Data:  NA  Antimicrobials:  NA  Interim history/subjective:  He remains tachycardic overnight however still on room air.  No new complaints. Objective   Blood pressure 115/84, pulse (Abnormal) 147, temperature 97.6 F (36.4 C), temperature source Oral, resp. rate (Abnormal) 25, height 6\' 3"  (1.905 m), weight (Abnormal) 186 kg, SpO2 98 %.        Intake/Output Summary (Last 24 hours) at 03/22/2019 1130 Last data filed at 03/22/2019 1100 Gross per 24 hour  Intake 2673.26 ml  Output 1300 ml  Net 1373.26 ml   Filed Weights   03/20/19 1618  Weight: (Abnormal) 186 kg    Examination: General: This is a 58 year old black male resting comfortably in bed currently on room air no acute distress HEENT normocephalic atraumatic no jugular venous distention is appreciated mucous membranes are moist Pulmonary: Clear to auscultation Cardiac: Remains tachycardic no murmur rub or gallop Abdomen: Soft nontender  no organomegaly Extremities: Warm and dry brisk capillary refill Neuro: Awake oriented no focal deficits  Resolved Hospital Problem list   NA  Assessment & Plan:   Submassive bilateral PE and Acute DVT, this is his second thromboembolic event, had DVT last year. -RV: LV ratio 1.3 -Echocardiogram noted to have global RV systolic function low/normal.  LVEF 60 to 65%.  Grade 1 diastolic dysfunction.  Normal size of right atrium and right ventricle -High-sensitivity troponin is trending down -PESI score: 2 (high risk) Plan We will continue IV heparin for now, I am concerned that the ongoing tachycardia may still be physiologic and compensatory for the pulmonary emboli and we may yet need to consider catheter directed therapy Repeat echocardiogram Will need long anticoagulation   On-going tachycardia  -suspect that this is physiologic and due to the PE Plan Cont tele  Would treat PE & address rate control very conservatively.    Obstructive sleep apnea: Plan Cont cpap at [place] ACNP-BC [place] Pager # [phone] OR # [phone] if no answer

## 2019-03-22 NOTE — Progress Notes (Signed)
[facility] Progress Note Patient Name: Brent Mccoy DOB: 03-21-1961 MRN: [id number]   Date of Service  03/22/2019  HPI/Events of Note  Sinus Tachycardia - HR = 150. No improvement with first bolus of 0.9 NaCl.   eICU Interventions  Will bolus with 0.9 NaCl 1 liter IV over 1 hour now.      Intervention Category Major Interventions: Arrhythmia - evaluation and management  Sommer,Steven Eugene 03/22/2019, 3:41 AM

## 2019-03-22 NOTE — Progress Notes (Signed)
ANTICOAGULATION CONSULT NOTE   Pharmacy Consult for Heparin  Indication: pulmonary embolus  No Known Allergies  Patient Measurements: Height: 6\' 3"  (190.5 cm) Weight: (!) 410 lb (186 kg) IBW/kg (Calculated) : 84.5 Heparin Dosing Weight: 129.7 kg   Vital Signs: Temp: 97.6 F (36.4 C) (11/30 0800) Temp Source: Oral (11/30 0800) BP: 115/84 (11/30 0800) Pulse Rate: 145 (11/30 0800)  Labs: Recent Labs    03/20/19 1703  03/21/19 0155 03/21/19 0339 03/21/19 0619 03/21/19 0754 03/21/19 1130 03/22/19 0228  HGB 16.0  --  15.7  --   --   --   --  14.9  HCT 49.1  --  48.9  --   --   --   --  46.6  PLT 168  --  164  --   --   --   --  161  HEPARINUNFRC  --   --  0.66  --   --   --  0.67 0.69  CREATININE 1.34*  --  1.27*  --   --   --   --  1.44*  CKTOTAL  --   --  210  --   --   --   --   --   CKMB  --   --  6.6*  --   --   --   --   --   TROPONINIHS 261*   < >  --  258* 151* 143*  --   --    < > = values in this interval not displayed.   Estimated Creatinine Clearance: 96.5 mL/min (A) (by C-G formula based on SCr of 1.44 mg/dL (H)).  Medical History: Past Medical History:  Diagnosis Date  . Allergic rhinitis   . DJD (degenerative joint disease)   . GERD (gastroesophageal reflux disease)    hx of - not current  . Hypertension    takes Losartan and Amlodipine daily  . Insomnia    takes Ambien nightly as needed  . Sarcoidosis   . Sleep apnea    Medications:  Scheduled:   Assessment: Patient is a 55 yoM that presented to the ED with chest pain & shortness of breath. The patient has a hx of a DVT last year.  Per patient, he was on Eliquis for a little over a month then switched to aspirin.  Chest CT + bilateral PE with rt heart strain.  Pharmacy has been consulted to dose heparin in this patient.  Baseline CBC WNL.  Heparin started 11/28 pm with 7500 unit bolus, 2200 units/hr  03/22/2019:  Confirmatory heparin level therapeutic (0.67) on heparin 2200 units/hr  CBC  WNL  No bleeding or infusion related issues reported by RN  Goal of Therapy:  Heparin level 0.3-0.7 units/ml Monitor platelets by anticoagulation protocol: Yes   Plan:  - Slightly reduce Heparin to 2150 units/hr - Recheck 8 hr heparin level - Monitor for s/s of bleeding  - Daily heparin level & CBC while on heparin  - F/U long term anticoagulation plans  Minda Ditto PharmD 03/22/2019,8:37 AM

## 2019-03-22 NOTE — Progress Notes (Signed)
Brief Pharmacy Note:   64 y/oM on IV heparin for bilateral submassive PE.    Heparin level = 0.53 units/mL, in therapeutic range   CBC WNL  No bleeding or infusion issues noted per nursing   Plan:  Continue heparin infusion at 2150 units/hr  Daily CBC, heparin level  Monitor closely for s/sx of bleeding   Lindell Spar, PharmD, BCPS Clinical Pharmacist  03/22/2019 6:20 PM

## 2019-03-22 NOTE — Progress Notes (Signed)
Adenocard push ordered by cardiology. Respiratory called to bedside for standby, patient placed on 4L [place], placed on Zoll, and continuous EKG. Adenosine pushed with respiratory, rapid response, and both Cardiology and PCCM MD at bedside. Patients heart rate decreased to 99 after administration. BP stable throughout. Patient remains alert and oriented on bedside telemetry.

## 2019-03-22 NOTE — Progress Notes (Addendum)
PROGRESS NOTE    Brent Mccoy  BMW:[id number]  DOB: [birth year]  DOA: 03/20/2019 PCP: Harlan Stains, MD  Brief Narrative:  58 y/o male with h/o HTN, DJD, sarcoidosis, sleep apnea and prior h/o LLE  DVT in 12/2017 after a 4 hour car drive, treated briefly with NOAC , then transitioned to ASA presented to the ED on 11/28 with c/o episodic exertional dyspnea  x1 week associated with atypical chest pain (indigestion like feeling).[facility],work-up revealed submassive bilateral PE with CTA on presentation reporting "Numerous bilateral large and moderate size pulmonary emboli noted in the central left and right pulmonary arteries and extending into the lobar and segmental branches. Elevated RV: LV ratio of 1.3. Aorta is normal caliber.". COVID test was negative.Patient was transferred to [facility]. Patient reports recent change in jobs and sedentary lifestyle.  Hospital course: Patient started on heparin drip and admitted to stepdown/ICUunit.Echo showed EF 60 to 65%, moderate LVH,grade 1 diastolic dysfunction with no right ventricular strain.High-sensitivity troponin peaked at 384,trending down to 143. Per admitting Hospitalist discussion with on call cardiologist,elevated troponin felt to be due to PE-venous doppler negative for DVT. Hospital course complicated by persistent sinus tachycardia, likely compensating for PE, hecurrently hasno hypoxia or hypotension at rest,critical care consulted for what appears to have been transient hypotension to systolic 27O on 53/66 but repeat BP check showed low normal BP and patient not felt to be a candidate for thrombolysis.  Subjective:  Patient resting comfortably. Denies any palpitations. Received IV fluids overnight for persistent tachycardia. Denies any chest pain. Saturating well on RA.   Objective: Vitals:   03/22/19 0736 03/22/19 0800 03/22/19 0900 03/22/19 1000  BP: (!) 142/98 115/84    Pulse: (!) 147 (!) 145 (!) 152 (!) 147  Resp: 17  20 (!) 21 (!) 25  Temp:  97.6 F (36.4 C)    TempSrc:  Oral    SpO2: 98% 97% 95% 98%  Weight:      Height:        Intake/Output Summary (Last 24 hours) at 03/22/2019 1032 Last data filed at 03/22/2019 1000 Gross per 24 hour  Intake 2859.8 ml  Output 850 ml  Net 2009.8 ml   Filed Weights   03/20/19 1618  Weight: (!) 186 kg    Physical Examination:  General exam: Appears calm and comfortable  Respiratory system: Clear to auscultation. Respiratory effort normal. Cardiovascular system: S1 & S2 heard, RRR. No JVD, murmurs, rubs, gallops or clicks. No pedal edema. Gastrointestinal system: Abdomen is nondistended, soft and nontender. No organomegaly or masses felt. Normal bowel sounds heard. Central nervous system: Alert and oriented. No new focal neurological deficits. Extremities: No contractures, edema or joint deformities.  Skin: No rashes, lesions or ulcers Psychiatry: Judgement and insight appear normal. Mood & affect appropriate.   Data Reviewed: I have personally reviewed following labs and imaging studies  CBC: Recent Labs  Lab 03/20/19 1703 03/21/19 0155 03/22/19 0228  WBC 5.8 7.0 6.6  HGB 16.0 15.7 14.9  HCT 49.1 48.9 46.6  MCV 88.5 89.7 90.3  PLT 168 164 440   Basic Metabolic Panel: Recent Labs  Lab 03/20/19 1703 03/21/19 0155 03/22/19 0228  NA 137 135 138  K 3.4* 3.3* 3.9  CL 101 102 106  CO2 '25 23 22  '$ GLUCOSE 100* 134* 115*  BUN 19 15 22*  CREATININE 1.34* 1.27* 1.44*  CALCIUM 8.7* 9.0 8.7*  MG  --   --  2.0   GFR: Estimated Creatinine Clearance: 96.5 mL/min (  A) (by C-G formula based on SCr of 1.44 mg/dL (H)). Liver Function Tests: Recent Labs  Lab 03/22/19 0228  AST 25  ALT 31  ALKPHOS 46  BILITOT 1.0  PROT 6.8  ALBUMIN 3.1*   No results for input(s): LIPASE, AMYLASE in the last 168 hours. No results for input(s): AMMONIA in the last 168 hours. Coagulation Profile: No results for input(s): INR, PROTIME in the last 168  hours. Cardiac Enzymes: Recent Labs  Lab 03/21/19 0155  CKTOTAL 210  CKMB 6.6*   BNP (last 3 results) No results for input(s): PROBNP in the last 8760 hours. HbA1C: No results for input(s): HGBA1C in the last 72 hours. CBG: No results for input(s): GLUCAP in the last 168 hours. Lipid Profile: No results for input(s): CHOL, HDL, LDLCALC, TRIG, CHOLHDL, LDLDIRECT in the last 72 hours. Thyroid Function Tests: No results for input(s): TSH, T4TOTAL, FREET4, T3FREE, THYROIDAB in the last 72 hours. Anemia Panel: No results for input(s): VITAMINB12, FOLATE, FERRITIN, TIBC, IRON, RETICCTPCT in the last 72 hours. Sepsis Labs: No results for input(s): PROCALCITON, LATICACIDVEN in the last 168 hours.  Recent Results (from the past 240 hour(s))  SARS Coronavirus 2 Ag (30 min TAT) - Nasal Swab (BD Veritor Kit)     Status: None   Collection Time: 03/20/19  5:03 PM   Specimen: Nasal Swab (BD Veritor Kit)  Result Value Ref Range Status   SARS Coronavirus 2 Ag NEGATIVE NEGATIVE Final    Comment: (NOTE) SARS-CoV-2 antigen NOT DETECTED.  Negative results are presumptive.  Negative results do not preclude SARS-CoV-2 infection and should not be used as the sole basis for treatment or other patient management decisions, including infection  control decisions, particularly in the presence of clinical signs and  symptoms consistent with COVID-19, or in those who have been in contact with the virus.  Negative results must be combined with clinical observations, patient history, and epidemiological information. The expected result is Negative. Fact Sheet for Patients: PodPark.tn Fact Sheet for Healthcare Providers: GiftContent.is This test is not yet approved or cleared by the [place] FDA and  has been authorized for detection and/or diagnosis of SARS-CoV-2 by FDA under an Emergency Use Authorization (EUA).  This EUA will remain in  effect (meaning this test can be used) for the duration of  the COVID-19 de claration under Section 564(b)(1) of the Act, 21 U.S.C. section 360bbb-3(b)(1), unless the authorization is terminated or revoked sooner. Performed at [facility], [address] [place], [place] [address]   SARS CORONAVIRUS 2 (TAT 6-24 HRS) Nasopharyngeal Nasopharyngeal Swab     Status: None   Collection Time: 03/20/19  6:00 PM   Specimen: Nasopharyngeal Swab  Result Value Ref Range Status   SARS Coronavirus 2 NEGATIVE NEGATIVE Final    Comment: (NOTE) SARS-CoV-2 target nucleic acids are NOT DETECTED. The SARS-CoV-2 RNA is generally detectable in upper and lower respiratory specimens during the acute phase of infection. Negative results do not preclude SARS-CoV-2 infection, do not rule out co-infections with other pathogens, and should not be used as the sole basis for treatment or other patient management decisions. Negative results must be combined with clinical observations, patient history, and epidemiological information. The expected result is Negative. Fact Sheet for Patients: SugarRoll.be Fact Sheet for Healthcare Providers: https://www.woods-mathews.com/ This test is not yet approved or cleared by the [place] FDA and  has been authorized for detection and/or diagnosis of SARS-CoV-2 by FDA under an Emergency Use Authorization (EUA).  This EUA will remain  in effect (meaning this test can be used) for the duration of the COVID-19 declaration under Section 56 4(b)(1) of the Act, 21 U.S.C. section 360bbb-3(b)(1), unless the authorization is terminated or revoked sooner. Performed at [facility], [place] [address], [place], [place] [address]   MRSA PCR Screening     Status: None   Collection Time: 03/20/19 10:50 PM   Specimen: Nasal Mucosa; Nasopharyngeal  Result Value Ref Range Status   MRSA by PCR NEGATIVE NEGATIVE Final     Comment:        The GeneXpert MRSA Assay (FDA approved for NASAL specimens only), is one component of a comprehensive MRSA colonization surveillance program. It is not intended to diagnose MRSA infection nor to guide or monitor treatment for MRSA infections. Performed at [facility], [place] [address] [place], [place] [address]       Radiology Studies: Dg Chest 2 View  Result Date: 03/20/2019 CLINICAL DATA:  58 year old male with shortness of breath and chest pain. EXAM: CHEST - 2 VIEW COMPARISON:  Chest radiograph dated 04/23/2016. FINDINGS: The lungs are clear. There is no pleural effusion pneumothorax. The cardiac silhouette is within normal limits. An accessory azygos fissure noted. No acute osseous pathology. IMPRESSION: No active cardiopulmonary disease. Electronically Signed   By: Anner Crete M.D.   On: 03/20/2019 17:10   Ct Angio Chest Pe W/cm &/or Wo Cm  Result Date: 03/20/2019 CLINICAL DATA:  Elevated D-dimer, shortness of breath. EXAM: CT ANGIOGRAPHY CHEST WITH CONTRAST TECHNIQUE: Multidetector CT imaging of the chest was performed using the standard protocol during bolus administration of intravenous contrast. Multiplanar CT image reconstructions and MIPs were obtained to evaluate the vascular anatomy. CONTRAST:  153m OMNIPAQUE IOHEXOL 350 MG/ML SOLN COMPARISON:  12/22/2017 FINDINGS: Cardiovascular: Numerous bilateral large and moderate size pulmonary emboli noted in the central left and right pulmonary arteries and extending into the lobar and segmental branches. Elevated RV: LV ratio of 1.3. Aorta is normal caliber. Mediastinum/Nodes: No mediastinal, hilar, or axillary adenopathy. Trachea and esophagus are unremarkable. Thyroid unremarkable. Lungs/Pleura: No confluent airspace opacities or effusions. Upper Abdomen: Imaging into the upper abdomen shows no acute findings. Musculoskeletal: Chest wall soft tissues are unremarkable. No acute bony abnormality.  Review of the MIP images confirms the above findings. IMPRESSION: Positive for acute PE with CT evidence of right heart strain (RV/LV Ratio = 1.3) consistent with at least submassive (intermediate risk) PE. The presence of right heart strain has been associated with an increased risk of morbidity and mortality. Please activate Code PE by paging [phone] Critical Value/emergent results were called by telephone at the time of interpretation on 03/20/2019 at 7:00 pm to [place], who verbally acknowledged these results. Electronically Signed   By: KRolm BaptiseM.D.   On: 03/20/2019 19:01   Vas [place]Lower Extremity Venous (dvt)  Result Date: 03/22/2019  Lower Venous Study Indications: Pulmonary embolism.  Risk Factors: Confirmed PE. Anticoagulation: Heparin. Limitations: Body habitus and poor ultrasound/tissue interface. Comparison Study: 12/23/2017 - L Gastrocnemius Performing Technologist: GOliver HumRVT  Examination Guidelines: A complete evaluation includes B-mode imaging, spectral Doppler, color Doppler, and power Doppler as needed of all accessible portions of each vessel. Bilateral testing is considered an integral part of a complete examination. Limited examinations for reoccurring indications may be performed as noted.  +---------+---------------+---------+-----------+----------+--------------+  RIGHT     Compressibility Phasicity Spontaneity Properties Thrombus Aging  +---------+---------------+---------+-----------+----------+--------------+  CFV  Full            Yes       Yes                                    +---------+---------------+---------+-----------+----------+--------------+  SFJ       Full                                                             +---------+---------------+---------+-----------+----------+--------------+  FV Prox   Full                                                              +---------+---------------+---------+-----------+----------+--------------+  FV Mid    Full                                                             +---------+---------------+---------+-----------+----------+--------------+  FV Distal Full                                                             +---------+---------------+---------+-----------+----------+--------------+  PFV       Full                                                             +---------+---------------+---------+-----------+----------+--------------+  POP       Full            Yes       Yes                                    +---------+---------------+---------+-----------+----------+--------------+  PTV       Full                                                             +---------+---------------+---------+-----------+----------+--------------+  PERO      Full                                                             +---------+---------------+---------+-----------+----------+--------------+   +---------+---------------+---------+-----------+----------+--------------+  LEFT      Compressibility Phasicity Spontaneity Properties Thrombus Aging  +---------+---------------+---------+-----------+----------+--------------+  CFV       Full            Yes       Yes                                    +---------+---------------+---------+-----------+----------+--------------+  SFJ       Full                                                             +---------+---------------+---------+-----------+----------+--------------+  FV Prox   Full                                                             +---------+---------------+---------+-----------+----------+--------------+  FV Mid    Full                                                             +---------+---------------+---------+-----------+----------+--------------+  FV Distal Full                                                              +---------+---------------+---------+-----------+----------+--------------+  PFV       Full                                                             +---------+---------------+---------+-----------+----------+--------------+  POP       Full            Yes       Yes                                    +---------+---------------+---------+-----------+----------+--------------+  PTV       Full                                                             +---------+---------------+---------+-----------+----------+--------------+  PERO      Full                                                             +---------+---------------+---------+-----------+----------+--------------+  Summary: Right: There is no evidence of deep vein thrombosis in the lower extremity. However, portions of this examination were limited- see technologist comments above. No cystic structure found in the popliteal fossa. Left: There is no evidence of deep vein thrombosis in the lower extremity. However, portions of this examination were limited- see technologist comments above. No cystic structure found in the popliteal fossa.  *See table(s) above for measurements and observations.    Preliminary         Scheduled Meds:  Chlorhexidine Gluconate Cloth  6 each Topical Daily   Continuous Infusions:  heparin 2,150 Units/hr (03/22/19 1000)    Assessment & Plan:   1. Acute bilateral submassive PE: On IV heparin.  Normotensive now , no RV strain/no evidence of hypoxia at rest. Check walking desat studies. He has been persistently tachycardic with HR mostly 100-140 with occasional dip to HR 30-50s .Transition to oral anticaogulants if no procedures. 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 He had LLE DVT in 2019 with repeat doppler LE in 2/20 showing resolution of clots. Will need Hematology w/u as outpatient as he does report family h/o blood clot in older brother. Mother has h/o miscarriage but had 10 children.  No known h/o hypercoagulable genetics.  2. Atypical chest pain/Elevated troponin: POA in the setting of #1. Echo as above. Formal cardiology eval deferred at that time.   3. Sinus tachycardia:Patient's baseline HR appears to be 80 to 100s on review of office records 2016--2019. Currently tachycardic in the setting of #1 but dropped to HR 40S with hypotension/near syncope with low dose metoprolol 2 days back. Would try coreg but patient reluctant. Will consult cardiology for eval ?SSS. Check TSH . Replace electrolytes.   4. HTN: Unable to tolerate meds with low normal BP/brady episodes.  Persistently tachycardic but unable to tolerate B-blockers/metoprolol earlier in the hospital course.   5.  Sarcoidosis:does not appear to be on any medication at home, CTA chest on presentation did not mention any adenopathy or lung parenchymal changes.He is followed by Dr Annamaria Boots  6. CKD stage 3a: stable and improved creatinine overall . Avoid nephrotoxins.  7. OSA: Resume CPAP QHS  8. Hypokalemia: replace as needed  9. Obesity: Body mass index is 51.25 kg/m.meet criteria for class III obesity. Couseled regarding diet modificaltions. F/U PCP.    DVT prophylaxis: Heparin drip Code Status: Full code Family / Patient Communication: D/W patient in detail Disposition Plan: home when medically cleared. Can transfer to telemetry if able to accommodate heparin drip.     LOS: 2 days    Time spent: 35 minutes    [place] Shi, MD [facility] Pager [phone]  If 7PM-7AM, please contact night-coverage [email or web address] Password [email or web address] 03/22/2019, 10:32 AM

## 2019-03-23 DIAGNOSIS — M1612 Unilateral primary osteoarthritis, left hip: Secondary | ICD-10-CM

## 2019-03-23 DIAGNOSIS — I824Z2 Acute embolism and thrombosis of unspecified deep veins of left distal lower extremity: Secondary | ICD-10-CM

## 2019-03-23 DIAGNOSIS — I471 Supraventricular tachycardia: Secondary | ICD-10-CM

## 2019-03-23 DIAGNOSIS — D869 Sarcoidosis, unspecified: Secondary | ICD-10-CM

## 2019-03-23 LAB — CBC
HCT: 45 % (ref 39.0–52.0)
Hemoglobin: 14.4 g/dL (ref 13.0–17.0)
MCH: 29.4 pg (ref 26.0–34.0)
MCHC: 32 g/dL (ref 30.0–36.0)
MCV: 91.8 fL (ref 80.0–100.0)
Platelets: 136 10*3/uL — ABNORMAL LOW (ref 150–400)
RBC: 4.9 MIL/uL (ref 4.22–5.81)
RDW: 13.6 % (ref 11.5–15.5)
WBC: 5.6 10*3/uL (ref 4.0–10.5)
nRBC: 0 % (ref 0.0–0.2)

## 2019-03-23 LAB — HEPARIN LEVEL (UNFRACTIONATED): Heparin Unfractionated: 0.67 IU/mL (ref 0.30–0.70)

## 2019-03-23 LAB — T4, FREE: Free T4: 1.22 ng/dL — ABNORMAL HIGH (ref 0.61–1.12)

## 2019-03-23 LAB — TSH: TSH: 1.981 u[IU]/mL (ref 0.350–4.500)

## 2019-03-23 MED ORDER — APIXABAN 5 MG PO TABS
10.0000 mg | ORAL_TABLET | Freq: Two times a day (BID) | ORAL | Status: DC
  Administered 2019-03-23: 10 mg via ORAL
  Filled 2019-03-23: qty 2

## 2019-03-23 MED ORDER — APIXABAN 5 MG PO TABS: 5.0000 mg | ORAL_TABLET | Freq: Two times a day (BID) | ORAL | Status: DC

## 2019-03-23 MED ORDER — ELIQUIS DVT/PE STARTER PACK 5 MG PO TBPK: ORAL_TABLET | ORAL | 0 refills | Status: DC

## 2019-03-23 NOTE — Discharge Summary (Addendum)
Physician Discharge Summary  Brent Mccoy:[id number] DOB: October 11, 1960 DOA: 03/20/2019  PCP: Harlan Stains, MD  Admit date: 03/20/2019 Discharge date: 03/23/2019 Consultations: Cardiology Dr Glenford Peers, Pulmonary Dr Lynetta Mare Admitted From: home Disposition: home  Discharge Diagnoses:  Principal Problem:   Pulmonary emboli [facility]) Active Problems:   Sarcoidosis ([place])   Primary osteoarthritis of left hip   Obstructive sleep apnea   Obesity, morbid (HCC)   DVT (deep venous thrombosis) (HCC)   Pulmonary embolism (HCC)   SVT (supraventricular tachycardia) [facility])     Hospital Course Summary: 58 y/o male with h/o HTN, DJD, sarcoidosis, sleep apnea and prior h/o LLE DVT in 12/2017 after a 4 hour car drive,treated briefly with NOAC , then transitioned to ASA presented to the ED on 11/28 with c/o episodic exertional dyspnea x1 week associated with atypical chest pain (indigestion like feeling).[facility],work-up revealedsubmassive bilateral PE with CTA onpresentation reporting"Numerous bilateral large and moderate size pulmonaryemboli noted in the central left and right pulmonary arteries andextending into the lobar and segmental branches. Elevated RV: LVratio of 1.3. Aorta is normal caliber.". COVID test was negative.Patient was transferred to [facility]. Patient reports recent change in jobs and sedentary lifestyle. He does report family h/o blood clot in older brother. Mother has h/o miscarriage but had 10 children. No known hypercoagulable genetic h/o. Hospital course:Patient started on heparin drip and admitted to stepdown/ICUunit.Echoshowed EF60 to 65%, moderate LVH,grade 1 diastolic dysfunction with noright ventricular strain.High-sensitivity troponin peaked at 384,downtrended to 143. Per admitting Hospitalist discussion with on callcardiologist,elevated troponinfelt to bedue to PE-Venous dopplers obtained which were negative for DVT. Hospital course complicated  bypersistent sinus tachycardia/PSVT likely related to new PE. PCCMconsulted for what appears to have been transient hypotension to systolic 65K on 35/46 but repeat BP check showed low normal BP and patient not felt to be a candidate for thrombolysis. He has been off his home antihypertensives while here (Diovan/Amlodipine).  1. Acute bilateral submassive FK:CLEXNTZ with IV heparin and now transitioned to Eliquis.  Pharmacy met with patient, he was educated on starter pack use and he will need refill on maintenance therapy through PCP.Normotensive now , no RV strain/no evidence of hypoxia at rest or on exertion per walking desat studies. 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 He had LLE DVT in 2019 with repeat doppler LE in 2/20 showing resolution of clots. Will need Hematology w/u as outpatient as he does report family h/o blood clots.  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  Encouraged ambulation as tolerated and to avoid prolonged immobilization.  2. Atypical chest pain/Elevated troponin: POA in the setting of #1. Echo as above.  Cardiology did not recommend any further work-up and felt elevated troponin was related to adrenergic response to PE  3.Sinus tachycardia/SVT:Patient's baseline HR appears to be 80 to 100s on review of office records 2016--2019.He was persistently tachycardic with HR mostly 100-140 with occasional dip to HR 30-50s during the initial stay. Low-dose metoprolol was tried on 11/28 but his heart rate dropped to 40S with hypotension/near syncope.  He had mild hypokalemia which was replaced.  Magnesium level was at 2.  Thyroid profile ordered but not sent-Will send lab work prior to discharge.  Cardiology was consulted to rule out sick sinus syndrome--during their evaluation patient went into SVT and responded to adenosine trial.  His heart rate has remained in the 80s to 90s today at rest and  on exertion.  Cardiology cleared  him for discharge and follow-up as outpatient  4. OFH:QRFXJO to tolerate meds with low normal BP/brady episodes during hospital course.  He was also briefly hypotensive past week and requiring IV fluid bolus.  He was on Norvasc/Diovan at home which have now both been discontinued.  His blood pressure today is low normal without any medications.  5. Sarcoidosis:does not appear to be on any medication at home, CTA chest on presentation did not mention any adenopathy or lung parenchymal changes.He is followed by Dr Annamaria Boots  6. CKD stage 3a: stableand improvedcreatinineoverall. Avoid nephrotoxins.  7. OSA: Resume CPAP QHS  8. Hypokalemia: replace as needed  9. Obesity:Body mass index is 51.25 kg/m.meet criteria for class III obesity. Couseled regarding diet modificaltions. F/U PCP.     Discharge Exam:  Vitals:   03/23/19 0733 03/23/19 0810  BP:  119/77  Pulse:  81  Resp:  18  Temp: 98.3 F (36.8 C)   SpO2:  94%   Vitals:   03/23/19 0500 03/23/19 0600 03/23/19 0733 03/23/19 0810  BP:    119/77  Pulse: 92 88  81  Resp: _0 Temp:   98.3 F (36.8 C)   TempSrc:   Axillary   SpO2: 96% 96%  94%  Weight:      Height:        General: Pt is alert, awake, not in acute distress Cardiovascular: RRR, S1/S2 +, no rubs, no gallops Respiratory: CTA bilaterally, no wheezing, no rhonchi Abdominal: Soft, NT, ND, bowel sounds + Extremities: no edema, no cyanosis  Discharge Condition:Stable CODE STATUS: Full code Diet recommendation: Low-salt diet Recommendations for Outpatient Follow-up:  1. Follow up with PCP: 5 to 7 days 2. Follow up with consultants: Cardiology Dr. Glenford Peers within 2 weeks, Hematology referral through PCP 3. Please follow up labs including: Thyroid profile  Home Health services upon discharge: None Equipment/Devices upon discharge: Did not qualify for home O2   Discharge Instructions:  Discharge Instructions     Call MD for:   Complete by: As directed    Chest pain/palpitations   Call MD for:  difficulty breathing, headache or visual disturbances   Complete by: As directed    Call MD for:  extreme fatigue   Complete by: As directed    Call MD for:  persistant dizziness or light-headedness   Complete by: As directed    Call MD for:  temperature >100.4   Complete by: As directed    Diet - low sodium heart healthy   Complete by: As directed    Increase activity slowly   Complete by: As directed      Allergies as of 03/23/2019   No Known Allergies     Medication List    STOP taking these medications   amLODipine 10 MG tablet Commonly known as: NORVASC   azithromycin 250 MG tablet Commonly known as: ZITHROMAX   Ginkoba 40 MG Tabs Generic drug: Ginkgo Biloba   predniSONE 20 MG tablet Commonly known as: DELTASONE   traMADol 50 MG tablet Commonly known as: ULTRAM   valsartan-hydrochlorothiazide 320-12.5 MG tablet Commonly known as: DIOVAN-HCT     TAKE these medications   diphenhydrAMINE 50 MG tablet Commonly known as: BENADRYL Take 50 mg by mouth at bedtime as needed for sleep.   Eliquis DVT/PE Starter Pack 5 MG Tabs Take as directed on package: start with two-4m tablets twice daily for 7 days. On day 8, switch to one-521mtablet twice daily.   Fish Oil  1000 MG Caps Take 1 capsule by mouth daily.   HYDROcodone-homatropine 5-1.5 MG/5ML syrup Commonly known as: HYCODAN Take 5 mLs by mouth at bedtime as needed.   loratadine 10 MG tablet Commonly known as: CLARITIN Take 10 mg by mouth daily as needed for allergies.   NON FORMULARY Take 1,000 mg by mouth as needed (sexual enhancement). epimedium   NON FORMULARY cpap   vitamin E 400 UNIT capsule Generic drug: vitamin E Take 400 Units by mouth daily.   zolpidem 10 MG tablet Commonly known as: AMBIEN Take 10 mg by mouth at bedtime as needed for sleep.      Follow-up Information    Lauraine Rinne, NP Follow up  on 05/04/2019.   Specialty: Pulmonary Disease Why: 1130am  Contact information: [place] [address] [place] [address] [phone]          No Known Allergies    The results of significant diagnostics from this hospitalization (including imaging, microbiology, ancillary and laboratory) are listed below for reference.    Labs: BNP (last 3 results) Recent Labs    03/20/19 1703  BNP 54.5   Basic Metabolic Panel: Recent Labs  Lab 03/20/19 1703 03/21/19 0155 03/22/19 0228  NA 137 135 138  K 3.4* 3.3* 3.9  CL 101 102 106  CO2 _0 GLUCOSE 100* 134* 115*  BUN 19 15 22*  CREATININE 1.34* 1.27* 1.44*  CALCIUM 8.7* 9.0 8.7*  MG  --   --  2.0   Liver Function Tests: Recent Labs  Lab 03/22/19 0228  AST 25  ALT 31  ALKPHOS 46  BILITOT 1.0  PROT 6.8  ALBUMIN 3.1*   No results for input(s): LIPASE, AMYLASE in the last 168 hours. No results for input(s): AMMONIA in the last 168 hours. CBC: Recent Labs  Lab 03/20/19 1703 03/21/19 0155 03/22/19 0228 03/23/19 0456  WBC 5.8 7.0 6.6 5.6  HGB 16.0 15.7 14.9 14.4  HCT 49.1 48.9 46.6 45.0  MCV 88.5 89.7 90.3 91.8  PLT 168 164 161 136*   Cardiac Enzymes: Recent Labs  Lab 03/21/19 0155  CKTOTAL 210  CKMB 6.6*   BNP: Invalid input(s): POCBNP CBG: No results for input(s): GLUCAP in the last 168 hours. D-Dimer Recent Labs    03/20/19 1703  DDIMER 6.00*   Hgb A1c No results for input(s): HGBA1C in the last 72 hours. Lipid Profile No results for input(s): CHOL, HDL, LDLCALC, TRIG, CHOLHDL, LDLDIRECT in the last 72 hours. Thyroid function studies No results for input(s): TSH, T4TOTAL, T3FREE, THYROIDAB in the last 72 hours.  Invalid input(s): FREET3 Anemia work up No results for input(s): VITAMINB12, FOLATE, FERRITIN, TIBC, IRON, RETICCTPCT in the last 72 hours. Urinalysis    Component Value Date/Time   COLORURINE YELLOW 03/21/2019 0000   APPEARANCEUR CLEAR 03/21/2019 0000   LABSPEC  1.025 03/21/2019 0000   PHURINE 6.0 03/21/2019 0000   GLUCOSEU NEGATIVE 03/21/2019 0000   HGBUR MODERATE (A) 03/21/2019 0000   BILIRUBINUR NEGATIVE 03/21/2019 0000   KETONESUR NEGATIVE 03/21/2019 0000   PROTEINUR NEGATIVE 03/21/2019 0000   UROBILINOGEN 1.0 02/09/2015 1130   NITRITE NEGATIVE 03/21/2019 0000   LEUKOCYTESUR NEGATIVE 03/21/2019 0000   Sepsis Labs Invalid input(s): PROCALCITONIN,  WBC,  LACTICIDVEN Microbiology Recent Results (from the past 240 hour(s))  SARS Coronavirus 2 Ag (30 min TAT) - Nasal Swab (BD Veritor Kit)     Status: None   Collection Time: 03/20/19  5:03 PM   Specimen: Nasal  Swab (BD Veritor Kit)  Result Value Ref Range Status   SARS Coronavirus 2 Ag NEGATIVE NEGATIVE Final    Comment: (NOTE) SARS-CoV-2 antigen NOT DETECTED.  Negative results are presumptive.  Negative results do not preclude SARS-CoV-2 infection and should not be used as the sole basis for treatment or other patient management decisions, including infection  control decisions, particularly in the presence of clinical signs and  symptoms consistent with COVID-19, or in those who have been in contact with the virus.  Negative results must be combined with clinical observations, patient history, and epidemiological information. The expected result is Negative. Fact Sheet for Patients: PodPark.tn Fact Sheet for Healthcare Providers: GiftContent.is This test is not yet approved or cleared by the [place] FDA and  has been authorized for detection and/or diagnosis of SARS-CoV-2 by FDA under an Emergency Use Authorization (EUA).  This EUA will remain in effect (meaning this test can be used) for the duration of  the COVID-19 de claration under Section 564(b)(1) of the Act, 21 U.S.C. section 360bbb-3(b)(1), unless the authorization is terminated or revoked sooner. Performed at [facility], [address]  [place], [place] [address]   SARS CORONAVIRUS 2 (TAT 6-24 HRS) Nasopharyngeal Nasopharyngeal Swab     Status: None   Collection Time: 03/20/19  6:00 PM   Specimen: Nasopharyngeal Swab  Result Value Ref Range Status   SARS Coronavirus 2 NEGATIVE NEGATIVE Final    Comment: (NOTE) SARS-CoV-2 target nucleic acids are NOT DETECTED. The SARS-CoV-2 RNA is generally detectable in upper and lower respiratory specimens during the acute phase of infection. Negative results do not preclude SARS-CoV-2 infection, do not rule out co-infections with other pathogens, and should not be used as the sole basis for treatment or other patient management decisions. Negative results must be combined with clinical observations, patient history, and epidemiological information. The expected result is Negative. Fact Sheet for Patients: SugarRoll.be Fact Sheet for Healthcare Providers: https://www.woods-mathews.com/ This test is not yet approved or cleared by the [place] FDA and  has been authorized for detection and/or diagnosis of SARS-CoV-2 by FDA under an Emergency Use Authorization (EUA). This EUA will remain  in effect (meaning this test can be used) for the duration of the COVID-19 declaration under Section 56 4(b)(1) of the Act, 21 U.S.C. section 360bbb-3(b)(1), unless the authorization is terminated or revoked sooner. Performed at [facility], [place] [address], [place], [place] [address]   MRSA PCR Screening     Status: None   Collection Time: 03/20/19 10:50 PM   Specimen: Nasal Mucosa; Nasopharyngeal  Result Value Ref Range Status   MRSA by PCR NEGATIVE NEGATIVE Final    Comment:        The GeneXpert MRSA Assay (FDA approved for NASAL specimens only), is one component of a comprehensive MRSA colonization surveillance program. It is not intended to diagnose MRSA infection nor to guide or monitor treatment for MRSA infections. Performed at  [facility], [place] [address] [place], [place] [address]     Procedures/Studies: Dg Chest 2 View  Result Date: 03/20/2019 CLINICAL DATA:  58 year old male with shortness of breath and chest pain. EXAM: CHEST - 2 VIEW COMPARISON:  Chest radiograph dated 04/23/2016. FINDINGS: The lungs are clear. There is no pleural effusion pneumothorax. The cardiac silhouette is within normal limits. An accessory azygos fissure noted. No acute osseous pathology. IMPRESSION: No active cardiopulmonary disease. Electronically Signed   By: Laren Everts.D.  On: 03/20/2019 17:10   Ct Angio Chest Pe W/cm &/or Wo Cm  Result Date: 03/20/2019 CLINICAL DATA:  Elevated D-dimer, shortness of breath. EXAM: CT ANGIOGRAPHY CHEST WITH CONTRAST TECHNIQUE: Multidetector CT imaging of the chest was performed using the standard protocol during bolus administration of intravenous contrast. Multiplanar CT image reconstructions and MIPs were obtained to evaluate the vascular anatomy. CONTRAST:  168m OMNIPAQUE IOHEXOL 350 MG/ML SOLN COMPARISON:  12/22/2017 FINDINGS: Cardiovascular: Numerous bilateral large and moderate size pulmonary emboli noted in the central left and right pulmonary arteries and extending into the lobar and segmental branches. Elevated RV: LV ratio of 1.3. Aorta is normal caliber. Mediastinum/Nodes: No mediastinal, hilar, or axillary adenopathy. Trachea and esophagus are unremarkable. Thyroid unremarkable. Lungs/Pleura: No confluent airspace opacities or effusions. Upper Abdomen: Imaging into the upper abdomen shows no acute findings. Musculoskeletal: Chest wall soft tissues are unremarkable. No acute bony abnormality. Review of the MIP images confirms the above findings. IMPRESSION: Positive for acute PE with CT evidence of right heart strain (RV/LV Ratio = 1.3) consistent with at least submassive (intermediate risk) PE. The presence of right heart strain has been associated with an increased risk  of morbidity and mortality. Please activate Code PE by paging [phone] Critical Value/emergent results were called by telephone at the time of interpretation on 03/20/2019 at 7:00 pm to [place], who verbally acknowledged these results. Electronically Signed   By: KRolm BaptiseM.D.   On: 03/20/2019 19:01   Vas UKoreaLower Extremity Venous (dvt)  Result Date: 03/22/2019  Lower Venous Study Indications: Pulmonary embolism.  Risk Factors: Confirmed PE. Anticoagulation: Heparin. Limitations: Body habitus and poor ultrasound/tissue interface. Comparison Study: 12/23/2017 - L Gastrocnemius Performing Technologist: GOliver HumRVT  Examination Guidelines: A complete evaluation includes B-mode imaging, spectral Doppler, color Doppler, and power Doppler as needed of all accessible portions of each vessel. Bilateral testing is considered an integral part of a complete examination. Limited examinations for reoccurring indications may be performed as noted.  +---------+---------------+---------+-----------+----------+--------------+ RIGHT    CompressibilityPhasicitySpontaneityPropertiesThrombus Aging +---------+---------------+---------+-----------+----------+--------------+ CFV      Full           Yes      Yes                                 +---------+---------------+---------+-----------+----------+--------------+ SFJ      Full                                                        +---------+---------------+---------+-----------+----------+--------------+ FV Prox  Full                                                        +---------+---------------+---------+-----------+----------+--------------+ FV Mid   Full                                                        +---------+---------------+---------+-----------+----------+--------------+ FV DistalFull                                                         +---------+---------------+---------+-----------+----------+--------------+  PFV      Full                                                        +---------+---------------+---------+-----------+----------+--------------+ POP      Full           Yes      Yes                                 +---------+---------------+---------+-----------+----------+--------------+ PTV      Full                                                        +---------+---------------+---------+-----------+----------+--------------+ PERO     Full                                                        +---------+---------------+---------+-----------+----------+--------------+   +---------+---------------+---------+-----------+----------+--------------+ LEFT     CompressibilityPhasicitySpontaneityPropertiesThrombus Aging +---------+---------------+---------+-----------+----------+--------------+ CFV      Full           Yes      Yes                                 +---------+---------------+---------+-----------+----------+--------------+ SFJ      Full                                                        +---------+---------------+---------+-----------+----------+--------------+ FV Prox  Full                                                        +---------+---------------+---------+-----------+----------+--------------+ FV Mid   Full                                                        +---------+---------------+---------+-----------+----------+--------------+ FV DistalFull                                                        +---------+---------------+---------+-----------+----------+--------------+ PFV      Full                                                        +---------+---------------+---------+-----------+----------+--------------+  POP      Full           Yes      Yes                                  +---------+---------------+---------+-----------+----------+--------------+ PTV      Full                                                        +---------+---------------+---------+-----------+----------+--------------+ PERO     Full                                                        +---------+---------------+---------+-----------+----------+--------------+     Summary: Right: There is no evidence of deep vein thrombosis in the lower extremity. However, portions of this examination were limited- see technologist comments above. No cystic structure found in the popliteal fossa. Left: There is no evidence of deep vein thrombosis in the lower extremity. However, portions of this examination were limited- see technologist comments above. No cystic structure found in the popliteal fossa.  *See table(s) above for measurements and observations. Electronically signed by Ruta Hinds MD on 03/22/2019 at 3:40:13 PM.    Final     Time coordinating discharge: Over 30 minutes  SIGNED:   Guilford Shi, MD  [facility] 03/23/2019, 2:34 PM Pager : [phone]

## 2019-03-23 NOTE — Progress Notes (Addendum)
ANTICOAGULATION CONSULT NOTE   Pharmacy Consult for Heparin transitioned to Apixaban Indication: pulmonary embolus  No Known Allergies  Patient Measurements: Height: 6\' 3"  (190.5 cm) Weight: (!) 410 lb (186 kg) IBW/kg (Calculated) : 84.5 Heparin Dosing Weight: 129.7 kg   Vital Signs: Temp: 98.8 F (37.1 C) (12/01 0400) Temp Source: Axillary (12/01 0400) BP: 112/66 (12/01 0436) Pulse Rate: 88 (12/01 0600)  Labs: Recent Labs    03/20/19 1703  03/21/19 0155 03/21/19 0339 03/21/19 0619 03/21/19 0754  03/22/19 0228 03/22/19 1700 03/23/19 0456  HGB 16.0  --  15.7  --   --   --   --  14.9  --  14.4  HCT 49.1  --  48.9  --   --   --   --  46.6  --  45.0  PLT 168  --  164  --   --   --   --  161  --  136*  HEPARINUNFRC  --   --  0.66  --   --   --    < > 0.69 0.53 0.67  CREATININE 1.34*  --  1.27*  --   --   --   --  1.44*  --   --   CKTOTAL  --   --  210  --   --   --   --   --   --   --   CKMB  --   --  6.6*  --   --   --   --   --   --   --   TROPONINIHS 261*   < >  --  258* 151* 143*  --   --   --   --    < > = values in this interval not displayed.   Estimated Creatinine Clearance: 96.5 mL/min (A) (by C-G formula based on SCr of 1.44 mg/dL (H)).  Medical History: Past Medical History:  Diagnosis Date  . Allergic rhinitis   . DJD (degenerative joint disease)   . GERD (gastroesophageal reflux disease)    hx of - not current  . Hypertension    takes Losartan and Amlodipine daily  . Insomnia    takes Ambien nightly as needed  . Sarcoidosis   . Sleep apnea    Medications:  Scheduled:   Assessment: Patient is a 19 yoM that presented to the ED with chest pain & shortness of breath. The patient has a hx of a DVT last year.  Per patient, he was on Eliquis for a little over a month then switched to aspirin.  Chest CT + bilateral PE with rt heart strain.  Pharmacy has been consulted to dose heparin in this patient.  Baseline CBC WNL.  Heparin started 11/28 pm with 7500  unit bolus, 2200 units/hr  03/23/2019:  0500 Hep level 0.67 units/ml on 2150 units/hr  CBC: Hgb 16 on admit > 14.4, Plt 168 > 136  No bleeding or infusion related issues reported by RN  Goal of Therapy:  Heparin level 0.3-0.7 units/ml Monitor platelets by anticoagulation protocol: Yes   Plan:  - Continue Heparin infusion at 2150 units/hr - Monitor for s/s of bleeding  - Daily heparin level & CBC while on heparin  - F/U long term anticoagulation plans  Minda Ditto PharmD 03/23/2019,6:33 AM   Discontinue Heparin at time of first Apixaban dose Apixaban 10mg  bid x 7 days, then 5mg  bid Counseled & $10 copay card given  Nyoka Cowden,  Lanae Federer L PharmD 03/23/2019, 11:28 AM

## 2019-03-23 NOTE — TOC Benefit Eligibility Note (Signed)
Transition of Care [facility]) Benefit Eligibility Note    Patient Details  Name: Brent Mccoy MRN: [id number] Date of Birth: 1961/02/09   Medication/Dose: Eliquis '5mg'$  2xday for 30days and Xarelto '20mg'$  for 30days  Covered?: Yes  Tier: 3 Drug  Prescription Coverage Preferred Pharmacy: local  Spoke with Person/Company/Phone Number:: [facility] [phone]  Co-Pay: $70.00 for Eliquis and Xarelto  Prior Approval: No  Deductible: Met       Kerin Salen Phone Number: 03/23/2019, 11:12 AM

## 2019-03-23 NOTE — Progress Notes (Signed)
   NAME:  Brent Mccoy, MRN:  [id number], DOB:  [birth year], LOS: 3 ADMISSION DATE:  03/20/2019, CONSULTATION DATE:  03/20/2019 REFERRING MD:  Juliet Rude, CHIEF COMPLAINT:  PE   Brief History   58 yo with chest pain and sob, PE by CT  Past Medical History   . Allergic rhinitis   . DJD (degenerative joint disease)   . GERD (gastroesophageal reflux disease)    hx of - not current  . Hypertension    takes Losartan and Amlodipine daily  . Insomnia    takes Ambien nightly as needed  . Sarcoidosis   . Sleep apnea Prior dvt     Significant Hospital Events   NA  Consults:  NA  Procedures:  NA  Significant Diagnostic Tests:  CT of chest 11/28: Submassive bilateral pulmonary emboli with RV/LV ratio of 1.3 Bilateral lower extremity ultrasound 11/30: Negative for DVT however imaging was limited -Echocardiogram noted to have global RV systolic function low/normal.  LVEF 60 to 65%.  Grade 1 diastolic dysfunction.  Normal size of right atrium and right ventricle Micro Data:  NA  Antimicrobials:  NA  Interim history/subjective:  Feels better.  Tachycardia resolved.  No chest pain.  No shortness of breath.  Eager to get out of bed and get moving Objective   Blood pressure 112/66, pulse 88, temperature 98.3 F (36.8 C), temperature source Axillary, resp. rate 19, height 6\' 3"  (1.905 m), weight (Abnormal) 186 kg, SpO2 96 %.        Intake/Output Summary (Last 24 hours) at 03/23/2019 0936 Last data filed at 03/23/2019 [id number] Gross per 24 hour  Intake 445.1 ml  Output 700 ml  Net -254.9 ml   Filed Weights   03/20/19 1618  Weight: (Abnormal) 186 kg    Examination: General this is a pleasant 58 year old male patient resting in bed he is in no acute distress this morning HEENT normocephalic atraumatic no jugular venous distention mucous membranes moist Pulmonary: Clear to auscultation without accessory use Cardiac: Regular rate and rhythm Abdomen: Soft nontender  Extremities: Warm and dry brisk cap refill Neuro: Awake oriented no focal deficit.  Resolved Hospital Problem list   NA  Assessment & Plan:   Submassive bilateral PE and Acute DVT, this is his second thromboembolic event, had DVT last year. -RV: LV ratio 1.3 -Echocardiogram noted to have global RV systolic function low/normal.  LVEF 60 to 65%.  Grade 1 diastolic dysfunction.  Normal size of right atrium and right ventricle -High-sensitivity troponin is trending down -PESI score: 2 (high risk) -Has remained hemodynamically stable Plan Transition to [place] Will need anticoagulation indefinitely I will set him up with follow-up in our office, he seen by Dr. Annamaria Boots  On-going tachycardia/supraventricular tachycardia status post successful conversion to normal sinus rhythm/sinus tachycardia following adenosine 12 mg.  Appreciate cardiology assistance Plan Continue telemetry monitoring for another 24 hours Avoiding beta-blockade Follow-up cardiology outpatient setting  Obstructive sleep apnea: Plan CPAP at bedtime  He is ready to move out of the intensive care critical care will sign off  Erick Colace ACNP-BC [facility] Pager # [phone] OR # [phone] if no answer

## 2019-03-23 NOTE — Progress Notes (Signed)
  Pt. ready to be placed on CPAP at this time, tolerating well, RT to monitor.

## 2019-03-23 NOTE — Discharge Instructions (Signed)
Information on my medicine - ELIQUIS (apixaban)  This medication education was reviewed with me or my healthcare representative as part of my discharge preparation.  The pharmacist that spoke with me during my hospital stay was:  Minda Ditto, [facility]  Why was Eliquis prescribed for you? Eliquis was prescribed to treat blood clots that may have been found in the veins of your legs (deep vein thrombosis) or in your lungs (pulmonary embolism) and to reduce the risk of them occurring again.  What do You need to know about Eliquis ? The starting dose is 10 mg (two 5 mg tablets) taken TWICE daily for the FIRST SEVEN (7) DAYS, then on (enter date)  12/08  the dose is reduced to ONE 5 mg tablet taken TWICE daily.  Eliquis may be taken with or without food.   Try to take the dose about the same time in the morning and in the evening. If you have difficulty swallowing the tablet whole please discuss with your pharmacist how to take the medication safely.  Take Eliquis exactly as prescribed and DO NOT stop taking Eliquis without talking to the doctor who prescribed the medication.  Stopping may increase your risk of developing a new blood clot.  Refill your prescription before you run out.  After discharge, you should have regular check-up appointments with your healthcare provider that is prescribing your Eliquis.    What do you do if you miss a dose? If a dose of ELIQUIS is not taken at the scheduled time, take it as soon as possible on the same day and twice-daily administration should be resumed. The dose should not be doubled to make up for a missed dose.  Important Safety Information A possible side effect of Eliquis is bleeding. You should call your healthcare provider right away if you experience any of the following: ? Bleeding from an injury or your nose that does not stop. ? Unusual colored urine (red or dark brown) or unusual colored stools (red or black). ? Unusual bruising for  unknown reasons. ? A serious fall or if you hit your head (even if there is no bleeding).  Some medicines may interact with Eliquis and might increase your risk of bleeding or clotting while on Eliquis. To help avoid this, consult your healthcare provider or pharmacist prior to using any new prescription or non-prescription medications, including herbals, vitamins, non-steroidal anti-inflammatory drugs (NSAIDs) and supplements.  This website has more information on Eliquis (apixaban): http://www.eliquis.com/eliquis/home

## 2019-03-23 NOTE — Progress Notes (Signed)
SATURATION QUALIFICATIONS: (This note is used to comply with regulatory documentation for home oxygen)  Patient Saturations on Room Air at Rest = 96%  Patient Saturations on Room Air while Ambulating = 95%  

## 2019-03-23 NOTE — Progress Notes (Signed)
Progress Note  Patient Name: Brent Mccoy Date of Encounter: 03/23/2019  Primary Cardiologist: New   Subjective   Pt feeling much better today. HR stable in the 80-90's with no recurrence of SVT on telemetry. No SOB, CP.  Inpatient Medications    Scheduled Meds: . Chlorhexidine Gluconate Cloth  6 each Topical Daily   Continuous Infusions: . heparin 2,150 Units/hr (03/23/19 [id number])   PRN Meds: loratadine, ondansetron **OR** ondansetron (ZOFRAN) IV, polyethylene glycol, zolpidem   Vital Signs    Vitals:   03/23/19 0400 03/23/19 0436 03/23/19 0500 03/23/19 0600  BP:  112/66    Pulse: 85 83 92 88  Resp: 18 17 16 19   Temp: 98.8 F (37.1 C)     TempSrc: Axillary     SpO2: 97% 93% 96% 96%  Weight:      Height:        Intake/Output Summary (Last 24 hours) at 03/23/2019 0726 Last data filed at 03/23/2019 [id number] Gross per 24 hour  Intake 1536.08 ml  Output 700 ml  Net 836.08 ml   Filed Weights   03/20/19 1618  Weight: (!) 186 kg    Physical Exam   General: Obese, NAD Lungs:Clear to ausculation bilaterally. No wheezes. Breathing is unlabored. Cardiovascular: RRR with S1 S2. No murmur Abdomen: Soft, non-tender, distended. No obvious abdominal masses. Extremities: No edema. No clubbing or cyanosis. DP pulses 2+ bilaterally Neuro: Alert and oriented. No focal deficits. No facial asymmetry. MAE spontaneously. Psych: Responds to questions appropriately with normal affect.    Labs    Chemistry Recent Labs  Lab 03/20/19 1703 03/21/19 0155 03/22/19 0228  NA 137 135 138  K 3.4* 3.3* 3.9  CL 101 102 106  CO2 25 23 22   GLUCOSE 100* 134* 115*  BUN 19 15 22*  CREATININE 1.34* 1.27* 1.44*  CALCIUM 8.7* 9.0 8.7*  PROT  --   --  6.8  ALBUMIN  --   --  3.1*  AST  --   --  25  ALT  --   --  31  ALKPHOS  --   --  46  BILITOT  --   --  1.0  GFRNONAA 57* >60 52*  GFRAA >60 >60 >60  ANIONGAP 11 10 10      Hematology Recent Labs  Lab 03/21/19 0155 03/22/19  0228 03/23/19 0456  WBC 7.0 6.6 5.6  RBC 5.45 5.16 4.90  HGB 15.7 14.9 14.4  HCT 48.9 46.6 45.0  MCV 89.7 90.3 91.8  MCH 28.8 28.9 29.4  MCHC 32.1 32.0 32.0  RDW 13.3 13.3 13.6  PLT 164 161 136*    Cardiac EnzymesNo results for input(s): TROPONINI in the last 168 hours. No results for input(s): TROPIPOC in the last 168 hours.   BNP Recent Labs  Lab 03/20/19 1703  BNP 50.6     DDimer  Recent Labs  Lab 03/20/19 1703  DDIMER 6.00*     Radiology    Vas [place] Lower Extremity Venous (dvt)  Result Date: 03/22/2019  Lower Venous Study Indications: Pulmonary embolism.  Risk Factors: Confirmed PE. Anticoagulation: Heparin. Limitations: Body habitus and poor ultrasound/tissue interface. Comparison Study: 12/23/2017 - L Gastrocnemius Performing Technologist: Oliver Hum RVT  Examination Guidelines: A complete evaluation includes B-mode imaging, spectral Doppler, color Doppler, and power Doppler as needed of all accessible portions of each vessel. Bilateral testing is considered an integral part of a complete examination. Limited examinations for reoccurring indications may be performed as noted.  +---------+---------------+---------+-----------+----------+--------------+  RIGHT    CompressibilityPhasicitySpontaneityPropertiesThrombus Aging +---------+---------------+---------+-----------+----------+--------------+ CFV      Full           Yes      Yes                                 +---------+---------------+---------+-----------+----------+--------------+ SFJ      Full                                                        +---------+---------------+---------+-----------+----------+--------------+ FV Prox  Full                                                        +---------+---------------+---------+-----------+----------+--------------+ FV Mid   Full                                                         +---------+---------------+---------+-----------+----------+--------------+ FV DistalFull                                                        +---------+---------------+---------+-----------+----------+--------------+ PFV      Full                                                        +---------+---------------+---------+-----------+----------+--------------+ POP      Full           Yes      Yes                                 +---------+---------------+---------+-----------+----------+--------------+ PTV      Full                                                        +---------+---------------+---------+-----------+----------+--------------+ PERO     Full                                                        +---------+---------------+---------+-----------+----------+--------------+   +---------+---------------+---------+-----------+----------+--------------+ LEFT     CompressibilityPhasicitySpontaneityPropertiesThrombus Aging +---------+---------------+---------+-----------+----------+--------------+ CFV      Full           Yes      Yes                                 +---------+---------------+---------+-----------+----------+--------------+  SFJ      Full                                                        +---------+---------------+---------+-----------+----------+--------------+ FV Prox  Full                                                        +---------+---------------+---------+-----------+----------+--------------+ FV Mid   Full                                                        +---------+---------------+---------+-----------+----------+--------------+ FV DistalFull                                                        +---------+---------------+---------+-----------+----------+--------------+ PFV      Full                                                         +---------+---------------+---------+-----------+----------+--------------+ POP      Full           Yes      Yes                                 +---------+---------------+---------+-----------+----------+--------------+ PTV      Full                                                        +---------+---------------+---------+-----------+----------+--------------+ PERO     Full                                                        +---------+---------------+---------+-----------+----------+--------------+     Summary: Right: There is no evidence of deep vein thrombosis in the lower extremity. However, portions of this examination were limited- see technologist comments above. No cystic structure found in the popliteal fossa. Left: There is no evidence of deep vein thrombosis in the lower extremity. However, portions of this examination were limited- see technologist comments above. No cystic structure found in the popliteal fossa.  *See table(s) above for measurements and observations. Electronically signed by Ruta Hinds MD on 03/22/2019 at 3:40:13 PM.    Final    Telemetry    03/23/2019 NSR HR in the 80-90's. No SVT recurrence - Personally Reviewed  ECG    No new tracing as of 03/23/2019- Personally Reviewed  Cardiac Studies   Echocardiogram 03/21/2019: 1. Left ventricular ejection fraction, by visual estimation, is 60 to 65%. The left ventricle has normal function. Left ventricular septal wall thickness was moderately increased. Moderately increased left ventricular posterior wall thickness. There is  moderately increased left ventricular hypertrophy. 2. Definity contrast agent was given IV to delineate the left ventricular endocardial borders. 3. Left ventricular diastolic parameters are consistent with Grade I diastolic dysfunction (impaired relaxation). 4. Global right ventricle has low normal systolic function.The right ventricular size is normal. No increase in  right ventricular wall thickness. 5. Left atrial size was normal. 6. Right atrial size was normal. 7. The mitral valve is normal in structure. Trace mitral valve regurgitation. No evidence of mitral stenosis. 8. The tricuspid valve is normal in structure. Tricuspid valve regurgitation is not demonstrated. 9. The aortic valve is tricuspid. Aortic valve regurgitation is not visualized. No evidence of aortic valve sclerosis or stenosis. 10. The pulmonic valve was normal in structure. Pulmonic valve regurgitation is not visualized. 11. TR signal is inadequate for assessing pulmonary artery systolic pressure. 12. The inferior vena cava is dilated in size with <50% respiratory variability, suggesting right atrial pressure of 15 mmHg.  Patient Profile     57 y.o. male with a hx of hypertension, DJD, sarcoidosis, sleep apnea compliant with CPAP and history of LLE DVT in 2019 treated with NOAC (6 months) with transition to ASA who is being seen today for the evaluation of tachycardia (intolerant to BB therapy) at the request of Dr. Drucilla Schmidt.  Assessment & Plan    1.  SVT: -Noted to be in SVT in which he was given adenosine 12 mg on 03/22/2019 with break to NSR/ST with heart rates in the 100 bpm range. -Telemetry review today reveals no recurrence with heart rate stable in the 80 to low 90 range, NSR  -Continue to avoid beta-blocker therapies at this time given profound symptomatic bradycardia -Cardiology will sign off and follow in the OP setting   2.  Elevated troponin in the setting of submassive PE: -High-sensitivity troponin initially elevated (258, 151, 143)>>no recurrent chest heaviness since admission  -Echocardiogram with stable LV function and no regional wall motion abnormalities -No further ischemic workup at this time   3.  Hypertension: -Not currently on antihypertensive medication secondary to low normal BP and episodes of bradycardia with beta blocking agents  4.  CKD  stage III: -Creatinine, 1.44 11/30 with a remote baseline of 1.1 -Continue to avoid nephrotoxic medications -May need OP follow-up with nephrology  5.  OSA: -Compliant with CPAP nightly -Follows OP with pulmonary medicine   6.  Chronic diastolic HF: -Per echocardiogram-with G1 DD -Volume status appears stable -On home Diovan   Signed, Kathyrn Drown NP-C [facility] Pager: [phone] 03/23/2019, 7:26 AM     For questions or updates, please contact   Please consult [email or web address] for contact info under Cardiology/STEMI.

## 2019-03-24 LAB — T3, FREE: T3, Free: 2.4 pg/mL (ref 2.0–4.4)

## 2019-04-21 ENCOUNTER — Encounter: Payer: Self-pay | Admitting: General Practice

## 2019-05-04 ENCOUNTER — Ambulatory Visit: Payer: 59 | Admitting: Internal Medicine

## 2019-05-04 ENCOUNTER — Other Ambulatory Visit: Payer: Self-pay

## 2019-05-04 ENCOUNTER — Inpatient Hospital Stay: Payer: 59 | Admitting: Pulmonary Disease

## 2019-05-04 ENCOUNTER — Encounter: Payer: Self-pay | Admitting: Internal Medicine

## 2019-05-04 VITALS — BP 128/76 | HR 85 | Temp 97.3°F | Ht 75.0 in | Wt >= 6400 oz

## 2019-05-04 DIAGNOSIS — G4733 Obstructive sleep apnea (adult) (pediatric): Secondary | ICD-10-CM

## 2019-05-04 DIAGNOSIS — I8290 Acute embolism and thrombosis of unspecified vein: Secondary | ICD-10-CM | POA: Insufficient documentation

## 2019-05-04 DIAGNOSIS — D869 Sarcoidosis, unspecified: Secondary | ICD-10-CM

## 2019-05-04 DIAGNOSIS — Z86718 Personal history of other venous thrombosis and embolism: Secondary | ICD-10-CM | POA: Insufficient documentation

## 2019-05-04 DIAGNOSIS — I82402 Acute embolism and thrombosis of unspecified deep veins of left lower extremity: Secondary | ICD-10-CM

## 2019-05-04 DIAGNOSIS — I2694 Multiple subsegmental pulmonary emboli without acute cor pulmonale: Secondary | ICD-10-CM | POA: Diagnosis not present

## 2019-05-04 NOTE — Assessment & Plan Note (Signed)
Breathing feels normal and no bleeding on Eliquis. Managed by his PCP

## 2019-05-04 NOTE — Assessment & Plan Note (Signed)
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

## 2019-05-04 NOTE — Patient Instructions (Addendum)
You will need to stay on Eliquis long-term, managed by your primary doctor.  Go ahead and make that cardiology appointment as directed  Order- DME [facility]- please replace old CPAP machine auto 10-16, mask of choice, humidifier, supplies, Airview/ card  Please call if we can help

## 2019-05-04 NOTE — Assessment & Plan Note (Addendum)
Compliant with CPAP and benefits. Machine is probably old enough to replace. Plan- replace machine auto 10-16

## 2019-05-04 NOTE — Progress Notes (Signed)
HPI Brent Mccoy never smoker followed for Sarcoid, Insomnia, OSA/CPAP,  complicated by HBP, allergic rhinitis, GERD, LeftDVT 2019, PE 2020/ Eliquis, Obesity, CKD3a,  NPSG [facility] 05/04/14 AHI 61.2/ hr, desaturation to 81%, CPAP to 11, body weight 422 lbs ACE 04/23/16- 68 ( 9-67) CBC with differential 04/23/2016-WNL -------------------------------------------------------------------------------------------- 06/24/17- 59 year old Brent Mccoy never smoker followed for Sarcoid, Insomnia, OSA/CPAP,  complicated by HBP, allergic rhinitis, GERD,  CPAP ? 12 /[facility] ----OSA; DME: [facility]. Pt wears CPAP machine every night for about 7 hours. Will need to order DL as patient did not bring machine or SD card to visit. Pt will need order for new supplies as well. Says he is "hooked on" his CPAP machine and cannot sleep well without it. Denies problems with his heart, breathing or diabetes despite his weight.  Line he had questions about how to recognize reactivation of sarcoid but is without concerning symptoms.  05/04/19-  57 year old Brent Mccoy never smoker followed for Sarcoid, Insomnia, OSA/CPAP,  complicated by HBP, allergic rhinitis, GERD, LeftDVT 2019, PE 2020/ Eliquis, Obesity, CKD3a,  CPAP auto12-16 /[facility] Download compliance 100%, AHI 0.2/ hr Hosp FU- 11/28- 03/23/2019- submassive bilateral PE.  LLE DVT in 2019 after long car ride.  Rx'd Eliquis, transitioned to ASA.  Brother had PE. Now desk job/ sedentary. Back on Eliquis per PCP. Had SVT in hosp, seen by Cardiology. Body weight today 426 lbs Breathing feels fine. Asks what cough syrup ok if needed, w Eliquis. Not needed now, but if needed, he can take an otc cough syrup w/o ASA. He can check w pharmacist if uncertain. Now trying to walk more. Declines flu vax, intends to take covid vax after he sees how it affects others. CTa 11/228/2020 IMPRESSION: Positive for acute PE with CT evidence of right heart strain (RV/LV Ratio = 1.3) consistent with at least  submassive (intermediate risk) PE. The presence of right heart strain has been associated with an increased risk of morbidity and mortality. Please activate Code PE by paging [phone].   ROV-see HPI    + = positive Constitutional:   No-   weight loss, night sweats, fevers, chills, fatigue, lassitude. HEENT:   No-  headaches, difficulty swallowing, tooth/dental problems, sore throat,       No-  sneezing, itching, ear ache, nasal congestion, post nasal drip,  CV:  No-   chest pain, orthopnea, PND, swelling in lower extremities, anasarca, dizziness, palpitations Resp: + shortness of breath with exertion or at rest.              No-   productive cough,   non-productive cough,  No- coughing up of blood.              No-   change in color of mucus.  No- wheezing.   Skin: No-   rash or lesions. GI:  No-   heartburn, indigestion, abdominal pain, nausea, vomiting, diarrhea,                 change in bowel habits, loss of appetite GU: No-   dysuria, change in color of urine, no urgency or frequency.  No- flank pain. MS:  + joint pain or swelling.  No- decreased range of motion.  No- back pain. Neuro-     nothing unusual Psych:  No- change in mood or affect. No depression or anxiety.  No memory loss.  OBJ General- Alert, Oriented, Affect-appropriate, Distress- none acute, +morbidly obese/ big, laconic Skin- rash-hyperpigmented area persists left lateral calf. , lesions- none, excoriation-  none Lymphadenopathy- none Head- atraumatic            Eyes- Gross vision intact, PERRLA, conjunctivae clear secretions            Ears- Hearing, canals-normal            Nose- Clear, no-Septal dev, mucus, polyps, erosion, perforation             Throat- Mallampati II-III , mucosa clear , drainage- none, tonsils- atrophic Neck- flexible , trachea midline, no stridor , thyroid nl, carotid no bruit Chest - symmetrical excursion , unlabored           Heart/CV- RRR , no murmur , no gallop  , no rub, nl s1 s2                            - JVD- none , edema- none, stasis changes- none, varices- none           Lung- clear to P&A, wheeze- none, cough- none , dullness-none, rub- none           Chest wall-  Abd-  Br/ Gen/ Rectal- Not done, not indicated Extrem- cyanosis- none, clubbing, none, atrophy- none, strength- nl Neuro- grossly intact to observation

## 2019-05-04 NOTE — Assessment & Plan Note (Signed)
Seriously overweight. Agree with walking more as a start.

## 2019-05-04 NOTE — Assessment & Plan Note (Signed)
Now on long-term Eliquis. Trying to walk more.

## 2019-05-04 NOTE — Progress Notes (Deleted)
@Patient  ID: Brent Mccoy, male    DOB: Feb 02, 1961, 59 y.o.   MRN: [id number]  No chief complaint on file.   Referring provider: Harlan Stains, MD  HPI:  59 year old male never smoker followed in our office for severe sleep apnea  PMH: Sarcoidosis, history of DVT (September/2019), insomnia, hypertension, obesity, history of PE (02/2019) Smoker/ Smoking History: Never smoker Maintenance:   Pt of: Dr. Annamaria Boots  05/04/2019  - Visit   59 year old male old male never smoker presenting to our office today for a hospital follow-up.  Patient was admitted to the hospital 03/20/2019.  Patient was discharged on 03/23/2019.  Patient was seen for an acute pulmonary embolism.  He had an acute bilateral submassive PE.  He was treated with IV heparin and transition to Eliquis.  Patient had a history of a left lower extremity DVT in 2019.  Repeat Doppler of the lower extremities in February/2020 showed resolution of clots.  He will be referred to hematology for work-up as outpatient as family does have a history of blood clots.  He does have a sedentary lifestyle.  He was encouraged to follow-up with his primary care doctor.  He was also encouraged to follow-up with cardiology.  It was expected that his primary care doctor would be the one referring to hematology.  Patient was last seen in our office in October/2019.   Questionaires / Pulmonary Flowsheets:   ACT:  No flowsheet data found.  MMRC: No flowsheet data found.  Epworth:  No flowsheet data found.  Tests:   NPSG [facility] 05/04/14 AHI 61.2/ hr, desaturation to 81%, CPAP to 11, body weight 422 lbs ACE 04/23/16- 68 ( 9-67) CBC with differential 04/23/2016-WNL Venous Doppler 12/2017 -left + DVT   12/22/2017-CTA chest-no PE, segmental branches of pulmonary arteries are poorly evaluated, no right ventricular strain, consider VQ scan, no pulmonary infarct  06/12/2018-lower extremity Doppler-no evidence of acute or chronic DVT within the left lower  extremity  03/20/2019-CTA chest-positive for acute PE with CT evidence of right heart strain, RV/LV ratio equals 1.3 consistent with at least submassive  03/21/2019-echocardiogram-LV ejection fraction 60 to 65%, left ventricular wall thickness is moderately increased, global right ventricle has low normal systolic function, grade 1 diastolic dysfunction   FENO:  No results found for: NITRICOXIDE  PFT: No flowsheet data found.  WALK:  No flowsheet data found.  Imaging: No results found.  Lab Results:  CBC    Component Value Date/Time   WBC 5.6 03/23/2019 0456   RBC 4.90 03/23/2019 0456   HGB 14.4 03/23/2019 0456   HCT 45.0 03/23/2019 0456   PLT 136 (L) 03/23/2019 0456   MCV 91.8 03/23/2019 0456   MCH 29.4 03/23/2019 0456   MCHC 32.0 03/23/2019 0456   RDW 13.6 03/23/2019 0456   LYMPHSABS 1.5 12/22/2017 1719   MONOABS 0.6 12/22/2017 1719   EOSABS 0.2 12/22/2017 1719   BASOSABS 0.0 12/22/2017 1719    BMET    Component Value Date/Time   NA 138 03/22/2019 0228   K 3.9 03/22/2019 0228   CL 106 03/22/2019 0228   CO2 22 03/22/2019 0228   GLUCOSE 115 (H) 03/22/2019 0228   BUN 22 (H) 03/22/2019 0228   CREATININE 1.44 (H) 03/22/2019 0228   CALCIUM 8.7 (L) 03/22/2019 0228   GFRNONAA 52 (L) 03/22/2019 0228   GFRAA >60 03/22/2019 0228    BNP    Component Value Date/Time   BNP 50.6 03/20/2019 1703    ProBNP No results found for: PROBNP  Specialty Problems      Pulmonary Problems   ALLERGIC RHINITIS    Qualifier: Diagnosis of  By: Annamaria Boots MD, Clinton D       Obstructive sleep apnea    NPSG [facility] 05/04/14 AHI 61.2/ hr, desaturation to 81%, CPAP to 11, body weight 422 lbs         No Known Allergies  Immunization History  Administered Date(s) Administered  . Influenza Split 06/01/2016, 01/20/2017    Past Medical History:  Diagnosis Date  . Allergic rhinitis   . DJD (degenerative joint disease)   . GERD (gastroesophageal reflux disease)    hx of -  not current  . Hypertension    takes Losartan and Amlodipine daily  . Insomnia    takes Ambien nightly as needed  . Sarcoidosis   . Sleep apnea     Tobacco History: Social History   Tobacco Use  Smoking Status Never Smoker  Smokeless Tobacco Never Used   Counseling given: Not Answered   Continue to not smoke  Outpatient Encounter Medications as of 05/04/2019  Medication Sig  . Apixaban Starter Pack (ELIQUIS DVT/PE STARTER PACK) 5 MG TBPK Take as directed on package: start with two-5mg  tablets twice daily for 7 days. On day 8, switch to one-5mg  tablet twice daily.  . diphenhydrAMINE (BENADRYL) 50 MG tablet Take 50 mg by mouth at bedtime as needed for sleep.  Marland Kitchen HYDROcodone-homatropine (HYCODAN) 5-1.5 MG/5ML syrup Take 5 mLs by mouth at bedtime as needed.  . loratadine (CLARITIN) 10 MG tablet Take 10 mg by mouth daily as needed for allergies.  . NON FORMULARY Take 1,000 mg by mouth as needed (sexual enhancement). epimedium  . NON FORMULARY cpap  . Omega-3 Fatty Acids (FISH OIL) 1000 MG CAPS Take 1 capsule by mouth daily.    . vitamin E (VITAMIN E) 400 UNIT capsule Take 400 Units by mouth daily.    Marland Kitchen zolpidem (AMBIEN) 10 MG tablet Take 10 mg by mouth at bedtime as needed for sleep.   No facility-administered encounter medications on file as of 05/04/2019.     Review of Systems  Review of Systems   Physical Exam  There were no vitals taken for this visit.  Wt Readings from Last 5 Encounters:  03/20/19 (!) 410 lb (186 kg)  01/20/18 (!) 436 lb (197.8 kg)  06/24/17 (!) 437 lb (198.2 kg)  06/24/16 (!) 417 lb (189.1 kg)  04/23/16 (!) 422 lb 6.4 oz (191.6 kg)    BMI Readings from Last 5 Encounters:  03/20/19 51.25 kg/m  01/20/18 54.50 kg/m  06/24/17 54.62 kg/m  06/24/16 52.12 kg/m  04/23/16 52.80 kg/m     Physical Exam    Assessment & Plan:   No problem-specific Assessment & Plan notes found for this encounter.    No follow-ups on file.   Lauraine Rinne, NP 05/04/2019   This appointment required *** minutes of patient care (this includes precharting, chart review, review of results, face-to-face care, etc.).

## 2019-06-02 NOTE — Progress Notes (Deleted)
CARDIOLOGY CONSULT NOTE       Patient ID: TREYSHAUN HASBUN MRN: [id number] DOB/AGE: 06/08/1960 59 y.o.  Admit date: (Not on file) Referring Physician: *** Primary Physician: Harlan Stains, MD Primary Cardiologist: *** Reason for Consultation: ***  Active Problems:   * No active hospital problems. *   HPI:  ***  ROS All other systems reviewed and negative except as noted above  Past Medical History:  Diagnosis Date  . Allergic rhinitis   . DJD (degenerative joint disease)   . GERD (gastroesophageal reflux disease)    hx of - not current  . Hypertension    takes Losartan and Amlodipine daily  . Insomnia    takes Ambien nightly as needed  . Sarcoidosis   . Sleep apnea     Family History  Problem Relation Age of Onset  . Rectal cancer Father     Social History   Socioeconomic History  . Marital status: Married    Spouse name: Not on file  . Number of children: 1  . Years of education: Not on file  . Highest education level: Not on file  Occupational History  . Not on file  Tobacco Use  . Smoking status: Never Smoker  . Smokeless tobacco: Never Used  Substance and Sexual Activity  . Alcohol use: Not Currently  . Drug use: No  . Sexual activity: Not on file  Other Topics Concern  . Not on file  Social History Narrative  . Not on file   Social Determinants of Health   Financial Resource Strain:   . Difficulty of Paying Living Expenses: Not on file  Food Insecurity:   . Worried About Charity fundraiser in the Last Year: Not on file  . Ran Out of Food in the Last Year: Not on file  Transportation Needs:   . Lack of Transportation (Medical): Not on file  . Lack of Transportation (Non-Medical): Not on file  Physical Activity:   . Days of Exercise per Week: Not on file  . Minutes of Exercise per Session: Not on file  Stress:   . Feeling of Stress : Not on file  Social Connections:   . Frequency of Communication with Friends and Family: Not on  file  . Frequency of Social Gatherings with Friends and Family: Not on file  . Attends Religious Services: Not on file  . Active Member of Clubs or Organizations: Not on file  . Attends Archivist Meetings: Not on file  . Marital Status: Not on file  Intimate Partner Violence:   . Fear of Current or Ex-Partner: Not on file  . Emotionally Abused: Not on file  . Physically Abused: Not on file  . Sexually Abused: Not on file    Past Surgical History:  Procedure Laterality Date  . COLONOSCOPY    . 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  Apixaban Starter Pack (ELIQUIS DVT/PE STARTER PACK) 5 MG TBPK, Take as directed on package: start with two-5mg  tablets twice daily for 7 days. On day 8, switch to one-5mg  tablet twice daily., Disp: 1 each, Rfl: 0 .  diphenhydrAMINE (BENADRYL) 50 MG tablet, Take 50 mg by mouth at bedtime as needed for sleep.,  Disp: , Rfl:  .  loratadine (CLARITIN) 10 MG tablet, Take 10 mg by mouth daily as needed for allergies., Disp: , Rfl:  .  NON FORMULARY, Take 1,000 mg by mouth as needed (sexual enhancement). epimedium, Disp: , Rfl:  .  NON FORMULARY, cpap, Disp: , Rfl:  .  Omega-3 Fatty Acids (FISH OIL) 1000 MG CAPS, Take 1 capsule by mouth daily.  , Disp: , Rfl:  .  valsartan-hydrochlorothiazide (DIOVAN-HCT) 320-12.5 MG tablet, Take 1 tablet by mouth daily., Disp: , Rfl:  .  vitamin E (VITAMIN E) 400 UNIT capsule, Take 400 Units by mouth daily.  , Disp: , Rfl:  .  zolpidem (AMBIEN) 10 MG tablet, Take 10 mg by mouth at bedtime as needed for sleep., Disp: , Rfl:     Physical Exam: There were no vitals taken for this visit. *** HELP TEXT ***  This SmartLink requires parameters. Parameters are variables that are added to  the [facility] name to request specific information. The parameter for .curwt is the number of readings to display.  For example: .curwt[4  In this example, the SmartLink displays the last four encounter readings.    {physical [id number]  Labs:   Lab Results  Component Value Date   WBC 5.6 03/23/2019   HGB 14.4 03/23/2019   HCT 45.0 03/23/2019   MCV 91.8 03/23/2019   PLT 136 (L) 03/23/2019   No results for input(s): NA, K, CL, CO2, BUN, CREATININE, CALCIUM, PROT, BILITOT, ALKPHOS, ALT, AST, GLUCOSE in the last 168 hours.  Invalid input(s): LABALBU Lab Results  Component Value Date   CKTOTAL 210 03/21/2019   CKMB 6.6 (H) 03/21/2019   No results found for: CHOL No results found for: HDL No results found for: LDLCALC No results found for: TRIG No results found for: CHOLHDL No results found for: LDLDIRECT    Radiology: No results found.  EKG: ***   ASSESSMENT AND PLAN:    Signed: Jenkins Rouge 06/02/2019, 6:55 PM

## 2019-06-07 ENCOUNTER — Emergency Department (HOSPITAL_COMMUNITY): Payer: 59

## 2019-06-07 ENCOUNTER — Emergency Department (HOSPITAL_COMMUNITY)
Admission: EM | Admit: 2019-06-07 | Discharge: 2019-06-07 | Disposition: A | Discharge status: Home / Self Care | Payer: 59 | Attending: Emergency Medicine | Admitting: Emergency Medicine

## 2019-06-07 ENCOUNTER — Encounter (HOSPITAL_COMMUNITY): Payer: Self-pay

## 2019-06-07 ENCOUNTER — Other Ambulatory Visit: Payer: Self-pay

## 2019-06-07 DIAGNOSIS — Z96642 Presence of left artificial hip joint: Secondary | ICD-10-CM | POA: Insufficient documentation

## 2019-06-07 DIAGNOSIS — I10 Essential (primary) hypertension: Secondary | ICD-10-CM | POA: Diagnosis not present

## 2019-06-07 DIAGNOSIS — Z7901 Long term (current) use of anticoagulants: Secondary | ICD-10-CM | POA: Diagnosis not present

## 2019-06-07 DIAGNOSIS — Z20822 Contact with and (suspected) exposure to covid-19: Secondary | ICD-10-CM | POA: Diagnosis not present

## 2019-06-07 DIAGNOSIS — R7989 Other specified abnormal findings of blood chemistry: Secondary | ICD-10-CM | POA: Diagnosis not present

## 2019-06-07 DIAGNOSIS — Z79899 Other long term (current) drug therapy: Secondary | ICD-10-CM | POA: Diagnosis not present

## 2019-06-07 DIAGNOSIS — R0602 Shortness of breath: Secondary | ICD-10-CM | POA: Diagnosis not present

## 2019-06-07 DIAGNOSIS — R0981 Nasal congestion: Secondary | ICD-10-CM | POA: Insufficient documentation

## 2019-06-07 LAB — BASIC METABOLIC PANEL
Anion gap: 8 (ref 5–15)
BUN: 20 mg/dL (ref 6–20)
CO2: 28 mmol/L (ref 22–32)
Calcium: 9.2 mg/dL (ref 8.9–10.3)
Chloride: 103 mmol/L (ref 98–111)
Creatinine, Ser: 1.36 mg/dL — ABNORMAL HIGH (ref 0.61–1.24)
GFR calc Af Amer: >60 mL/min (ref >60)
GFR calc non Af Amer: 56 mL/min — ABNORMAL LOW (ref >60)
Glucose, Bld: 97 mg/dL (ref 70–99)
Potassium: 3.8 mmol/L (ref 3.5–5.1)
Sodium: 139 mmol/L (ref 135–145)

## 2019-06-07 LAB — CBC WITH DIFFERENTIAL/PLATELET
Abs Immature Granulocytes: 0.01 10*3/uL (ref 0.00–0.07)
Basophils Absolute: 0 10*3/uL (ref 0.0–0.1)
Basophils Relative: 0 %
Eosinophils Absolute: 0.2 10*3/uL (ref 0.0–0.5)
Eosinophils Relative: 4 %
HCT: 46.3 % (ref 39.0–52.0)
Hemoglobin: 14.7 g/dL (ref 13.0–17.0)
Immature Granulocytes: 0 %
Lymphocytes Relative: 35 %
Lymphs Abs: 1.7 10*3/uL (ref 0.7–4.0)
MCH: 29 pg (ref 26.0–34.0)
MCHC: 31.7 g/dL (ref 30.0–36.0)
MCV: 91.3 fL (ref 80.0–100.0)
Monocytes Absolute: 0.5 10*3/uL (ref 0.1–1.0)
Monocytes Relative: 11 %
Neutro Abs: 2.5 10*3/uL (ref 1.7–7.7)
Neutrophils Relative %: 50 %
Platelets: 173 10*3/uL (ref 150–400)
RBC: 5.07 MIL/uL (ref 4.22–5.81)
RDW: 13.4 % (ref 11.5–15.5)
WBC: 4.9 10*3/uL (ref 4.0–10.5)
nRBC: 0 % (ref 0.0–0.2)

## 2019-06-07 LAB — TROPONIN I (HIGH SENSITIVITY): Troponin I (High Sensitivity): 5 ng/L (ref <18)

## 2019-06-07 LAB — D-DIMER, QUANTITATIVE: D-Dimer, Quant: 0.64 ug/mL-FEU — ABNORMAL HIGH (ref 0.00–0.50)

## 2019-06-07 MED ORDER — IOHEXOL 350 MG/ML SOLN
100.0000 mL | Freq: Once | INTRAVENOUS | Status: AC | PRN
  Administered 2019-06-07: 100 mL via INTRAVENOUS

## 2019-06-07 NOTE — Discharge Instructions (Signed)
You had signs of an upper respiratory infection during a pandemic.  It is hard to say that you do not have the coronavirus.  Please self isolate until you have obtained a negative test or are asymptomatic or it has been 10 days.

## 2019-06-07 NOTE — ED Provider Notes (Signed)
[facility] DEPT Provider Note   CSN: [id number] Arrival date & time: 06/07/19  1656     History Chief Complaint  Patient presents with  . Abnormal Lab    Brent Mccoy is a 59 y.o. male.  60 yo M with a chief complaints of an elevated D-dimer. Patient went to his family doctor for some congestion going on for the past 48 hours. Lab work was performed including a D-dimer which was elevated. Patient has recently been diagnosed with a pulmonary embolism about 2 months ago and had a stay in the hospital. He is on Eliquis. Denies any missed doses. He denies chest pain denies shortness of breath denies hemoptysis denies unilateral lower extremity edema. He denies fever denies sick contacts. Was called and sent here for evaluation.  The history is provided by the patient.  Abnormal Lab Illness Severity:  Mild Onset quality:  Gradual Duration:  2 days Timing:  Constant Progression:  Worsening Chronicity:  New Associated symptoms: congestion   Associated symptoms: no abdominal pain, no chest pain, no diarrhea, no fever, no headaches, no myalgias, no rash, no shortness of breath and no vomiting        Past Medical History:  Diagnosis Date  . Allergic rhinitis   . DJD (degenerative joint disease)   . GERD (gastroesophageal reflux disease)    hx of - not current  . Hypertension    takes Losartan and Amlodipine daily  . Insomnia    takes Ambien nightly as needed  . Sarcoidosis   . Sleep apnea     Patient Active Problem List   Diagnosis Date Noted  . VTE (venous thromboembolism) 05/04/2019  . SVT (supraventricular tachycardia) ([place]) 03/23/2019  . Pulmonary emboli ([place]) 03/20/2019  . DVT (deep venous thrombosis) ([place]) 01/20/2018  . Obesity, morbid ([facility] [place]) 06/25/2017  . Obstructive sleep apnea 06/30/2016  . Primary osteoarthritis of left hip 02/20/2015  . Arthritis, hip 02/20/2015  . HYPERTENSION 06/15/2010  . ALLERGIC RHINITIS 06/15/2010  .  INSOMNIA 03/28/2008  . Sarcoidosis ([place]) 01/07/2008    Past Surgical History:  Procedure Laterality Date  . COLONOSCOPY    . 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 Rectal cancer Father     Social History   Tobacco Use  . Smoking status: Never Smoker  . Smokeless tobacco: Never Used  Substance Use Topics  . Alcohol use: Yes    Comment: occasionally  . Drug use: No    Home Medications Prior to Admission medications   Medication Sig Start Date End Date Taking? Authorizing Provider  amLODipine (NORVASC) 10 MG tablet Take 10 mg by mouth every morning. 05/08/19  Yes [provider]  docusate sodium (COLACE) 100 MG capsule Take 100 mg by mouth every other day as needed for mild constipation.   Yes [provider]  ELIQUIS 5 MG TABS tablet Take 5 mg by mouth 2 (two) times daily. 05/13/19  Yes [provider]  fluticasone (FLONASE) 50 MCG/ACT nasal spray Place 1 spray into both nostrils daily as needed for allergies or rhinitis.   Yes [provider]  loratadine (CLARITIN) 10 MG tablet Take 10 mg by mouth daily as needed  for allergies.   Yes [provider]  NON FORMULARY Take 1,000 mg by mouth as needed (sexual enhancement). epimedium   Yes [provider]  Omega-3 Fatty Acids (FISH OIL) 1000 MG CAPS Take 1 capsule by mouth daily.     Yes [provider]  valsartan-hydrochlorothiazide (DIOVAN-HCT) 320-12.5 MG tablet Take 1 tablet by mouth daily. 03/30/19  Yes [provider]  vitamin E (VITAMIN E) 400 UNIT capsule Take 400 Units by mouth daily.     Yes [provider]  zolpidem (AMBIEN) 10 MG tablet Take 10 mg by mouth at bedtime as needed for sleep.   Yes  [provider]  NON FORMULARY cpap    [provider]    Allergies    Patient has no known allergies.  Review of Systems   Review of Systems  Constitutional: Negative for chills and fever.  HENT: Positive for congestion and postnasal drip. Negative for facial swelling.   Eyes: Negative for discharge and visual disturbance.  Respiratory: Negative for shortness of breath.   Cardiovascular: Negative for chest pain and palpitations.  Gastrointestinal: Negative for abdominal pain, diarrhea and vomiting.  Musculoskeletal: Negative for arthralgias and myalgias.  Skin: Negative for color change and rash.  Neurological: Negative for tremors, syncope and headaches.  Psychiatric/Behavioral: Negative for confusion and dysphoric mood.    Physical Exam Updated Vital Signs BP 134/68   Pulse 81   Temp 98.3 F (36.8 C) (Oral)   Resp 19   Ht 6\' 3"  (1.905 m)   Wt (!) 191.4 kg   SpO2 100%   BMI 52.75 kg/m   Physical Exam Vitals and nursing note reviewed.  Constitutional:      Appearance: He is well-developed. He is obese.  HENT:     Head: Normocephalic and atraumatic.     Comments: Swollen turbinates, posterior nasal drip Eyes:     Pupils: Pupils are equal, round, and reactive to light.  Neck:     Vascular: No JVD.  Cardiovascular:     Rate and Rhythm: Normal rate and regular rhythm.     Heart sounds: No murmur. No friction rub. No gallop.   Pulmonary:     Effort: No respiratory distress.     Breath sounds: No wheezing.  Abdominal:     General: There is no distension.     Tenderness: There is no abdominal tenderness. There is no guarding or rebound.  Musculoskeletal:        General: Normal range of motion.     Cervical back: Normal range of motion and neck supple.  Skin:    Coloration: Skin is not pale.     Findings: No rash.  Neurological:     Mental Status: He is alert and oriented to person, place, and time.  Psychiatric:        Behavior: Behavior  normal.     ED Results / Procedures / Treatments   Labs (all labs ordered are listed, but only abnormal results are displayed) Labs Reviewed  BASIC METABOLIC PANEL - Abnormal; Notable for the following components:      Result Value   Creatinine, Ser 1.36 (*)    GFR calc non Af Amer 56 (*)    All other components within normal limits  D-DIMER, QUANTITATIVE (NOT AT [facility]) - Abnormal; Notable for the following components:   D-Dimer, Quant 0.64 (*)    All other components within normal limits  NOVEL CORONAVIRUS, NAA (HOSP ORDER, SEND-OUT TO REF LAB; TAT 18-24  HRS)  CBC WITH DIFFERENTIAL/PLATELET  TROPONIN I (HIGH SENSITIVITY)    EKG EKG Interpretation  Date/Time:  Monday June 07 2019 18:07:55 EST Ventricular Rate:  82 PR Interval:    QRS Duration: 99 QT Interval:  352 QTC Calculation: 412 R Axis:   13 Text Interpretation: Sinus rhythm Atrial premature complex Since last tracing rate slower Otherwise no significant change Confirmed by Deno Etienne [phone]) on 06/07/2019 6:13:45 PM   Radiology CT Angio Chest PE W and/or Wo Contrast  Result Date: 06/07/2019 CLINICAL DATA:  Short of breath elevated D-dimer EXAM: CT ANGIOGRAPHY CHEST WITH CONTRAST TECHNIQUE: Multidetector CT imaging of the chest was performed using the standard protocol during bolus administration of intravenous contrast. Multiplanar CT image reconstructions and MIPs were obtained to evaluate the vascular anatomy. CONTRAST:  133mL OMNIPAQUE IOHEXOL 350 MG/ML SOLN COMPARISON:  CT 03/20/2019 FINDINGS: Cardiovascular: Suboptimal opacification of the pulmonary arterial system. Small linear defect within left upper lobe segmental branch vessel, series 6, image number 107. This finding was noted on 03/20/2019 and is felt to represent sequela of chronic embolus. No definite new or acute filling defects are visualized allowing for suboptimal contrast opacification. Nonaneurysmal aorta. Mild aortic atherosclerosis. Borderline to  mild cardiomegaly. Small pericardial effusion. Mediastinum/Nodes: No enlarged mediastinal, hilar, or axillary lymph nodes. Thyroid gland, trachea, and esophagus demonstrate no significant findings. Lungs/Pleura: Stable small ground-glass focus in the left upper lobe. No consolidation or pleural effusion. Right azygos lobe. Upper Abdomen: No acute abnormality. Musculoskeletal: No chest wall abnormality. No acute or significant osseous findings. Review of the MIP images confirms the above findings. IMPRESSION: 1. Study is limited by suboptimal opacification of the pulmonary arterial system. The previously noted extensive bilateral pulmonary emboli have largely resolved. Suspected small chronic embolus in left upper lobe branch vessel. No definite acute or new embolus is visualized. 2. Borderline to mild cardiomegaly. 3. No acute pulmonary airspace disease Aortic Atherosclerosis (ICD10-I70.0). Electronically Signed   By: Donavan Foil M.D.   On: 06/07/2019 19:57    Procedures Procedures (including critical care time)  Medications Ordered in ED Medications  iohexol (OMNIPAQUE) 350 MG/ML injection 100 mL (100 mLs Intravenous Contrast Given 06/07/19 1933)    ED Course  I have reviewed the triage vital signs and the nursing notes.  Pertinent labs & imaging results that were available during my care of the patient were reviewed by me and considered in my medical decision making (see chart for details).    MDM Rules/Calculators/A&P                      59 yo M with a chief complaint of congestion. Patient was seen by an outpatient provider and had lab work done including a D-dimer that was positive. I'm unsure of the utility of D-dimer testing, I suspect it would be elevated with less than 2 months of treatment for pulmonary embolism. Since he was sent here for this I will obtain a PET scan to evaluate for worsening burden. We'll also obtain a troponin and BNP. Since this congestion is occurring during the  coronavirus pandemic which is also known to elevate your D-dimer that also should be a consideration. No shortness of breath no fevers. We'll likely do a test if the patient is to be sent home.  PE scan is negative.  Troponins negative BNP is negative.  Will discharge home.  10:20 PM:  I have discussed the diagnosis/risks/treatment options with the patient and believe the pt to be eligible  for discharge home to follow-up with PCP. We also discussed returning to the ED immediately if new or worsening sx occur. We discussed the sx which are most concerning (e.g., sudden worsening pain, fever, inability to tolerate by mouth) that necessitate immediate return. Medications administered to the patient during their visit and any new prescriptions provided to the patient are listed below.  Medications given during this visit Medications  iohexol (OMNIPAQUE) 350 MG/ML injection 100 mL (100 mLs Intravenous Contrast Given 06/07/19 1933)     The patient appears reasonably screen and/or stabilized for discharge and I doubt any other medical condition or other [facility] requiring further screening, evaluation, or treatment in the ED at this time prior to discharge.   Final Clinical Impression(s) / ED Diagnoses Final diagnoses:  Head congestion    Rx / DC Orders ED Discharge Orders    None       Deno Etienne, DO 06/07/19 2220

## 2019-06-07 NOTE — ED Triage Notes (Addendum)
Patient states he was told to come to the ED because he had a d-dimer-1.01.

## 2019-06-09 LAB — NOVEL CORONAVIRUS, NAA (HOSP ORDER, SEND-OUT TO REF LAB; TAT 18-24 HRS): SARS-CoV-2, NAA: NOT DETECTED

## 2019-06-10 ENCOUNTER — Ambulatory Visit: Payer: 59 | Admitting: Cardiovascular Disease

## 2019-06-15 NOTE — Progress Notes (Signed)
Cardiology Office Note:   Date:  06/18/2019  NAME:  Brent Mccoy    MRN: [id number] DOB:  May 08, 1960   PCP:  Harlan Stains, MD  Cardiologist:  No primary care provider on file.   Referring MD: Harlan Stains, MD   Chief Complaint  Patient presents with  . svt    Post hospital.   History of Present Illness:   Brent Mccoy is a 59 y.o. male with a hx of morbid obesity, pulmonary embolism, SVT who is being seen today for the evaluation of SVT at the request of Harlan Stains, MD.  I saw him in the hospital in November for an episode of SVT that resolved with adenosine x1.  This is in the setting of submassive pulmonary embolism.  He reports he is doing well since leaving the hospital.  He denies any chest pain or shortness of breath.  He reports that he has had several family issues and has been unable to exercise.  He is not lost any weight.  He does have a bolus to start an exercise regimen and to improve his diet.  Blood pressure well controlled today.  He is not diabetic.  Review of most recent lipid profile shows LDL 98.  He is having no issues tolerating his Eliquis.  No bleeding reported.  He does report some dark stools at times that is associated with straining.  I have encouraged him to follow-up with his primary care physician to ensure he does not have a hemorrhoid.  I did advise him that lifelong anticoagulation is recommended.  This is a second blood clot he will remain on anticoagulation indefinitely.  Problem List 1. SVT -In setting of submassive pulmonary embolism 03/20/2019 -Terminated with adenosine x1 2. Submassive PE -03/20/2019 3. Morbid Obesity  Past Medical History: Past Medical History:  Diagnosis Date  . Allergic rhinitis   . Arrhythmia   . DJD (degenerative joint disease)   . GERD (gastroesophageal reflux disease)    hx of - not current  . Hypertension    takes Losartan and Amlodipine daily  . Insomnia    takes Ambien nightly as needed  .  Sarcoidosis   . Sleep apnea     Past Surgical History: Past Surgical History:  Procedure Laterality Date  . COLONOSCOPY    . 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 amLODipine (NORVASC) 10 MG tablet Take 10 mg by mouth every morning.  . docusate sodium (COLACE) 100 MG capsule Take 100 mg by mouth every other day as needed for mild constipation.  Marland Kitchen ELIQUIS 5 MG TABS tablet Take 5 mg by mouth 2 (two) times daily.  . fluticasone (FLONASE) 50 MCG/ACT nasal spray Place 1 spray into both nostrils daily as needed for allergies or rhinitis.  Marland Kitchen loratadine (CLARITIN) 10 MG tablet Take 10 mg by mouth daily as needed for allergies.  . NON FORMULARY Take 1,000 mg by mouth as needed (sexual enhancement). epimedium  . NON FORMULARY cpap  . Omega-3 Fatty Acids (FISH OIL) 1000 MG CAPS Take 1 capsule by mouth daily.    . valsartan-hydrochlorothiazide (DIOVAN-HCT) 320-12.5 MG tablet Take 1 tablet by mouth daily.  . vitamin E (VITAMIN E) 400 UNIT capsule  Take 400 Units by mouth daily.    Marland Kitchen zolpidem (AMBIEN) 10 MG tablet Take 10 mg by mouth at bedtime as needed for sleep.     Allergies:    Patient has no known allergies.   Social History: Social History   Socioeconomic History  . Marital status: Married    Spouse name: Not on file  . Number of children: 1  . Years of education: Not on file  . Highest education level: Not on file  Occupational History  . Not on file  Tobacco Use  . Smoking status: Never Smoker  . Smokeless tobacco: Never Used  Substance and Sexual Activity  . Alcohol use: Yes    Comment: occasionally  . Drug use: No  . Sexual activity: Not on file  Other Topics Concern  . Not on file  Social History Narrative  . Not on  file   Social Determinants of Health   Financial Resource Strain:   . Difficulty of Paying Living Expenses: Not on file  Food Insecurity:   . Worried About Charity fundraiser in the Last Year: Not on file  . Ran Out of Food in the Last Year: Not on file  Transportation Needs:   . Lack of Transportation (Medical): Not on file  . Lack of Transportation (Non-Medical): Not on file  Physical Activity:   . Days of Exercise per Week: Not on file  . Minutes of Exercise per Session: Not on file  Stress:   . Feeling of Stress : Not on file  Social Connections:   . Frequency of Communication with Friends and Family: Not on file  . Frequency of Social Gatherings with Friends and Family: Not on file  . Attends Religious Services: Not on file  . Active Member of Clubs or Organizations: Not on file  . Attends Archivist Meetings: Not on file  . Marital Status: Not on file     Family History: The patient's family history includes Rectal cancer in his father.  ROS:   All other ROS reviewed and negative. Pertinent positives noted in the HPI.     EKGs/Labs/Other Studies Reviewed:   The following studies were personally reviewed by me today:  Recent Labs: 03/20/2019: B Natriuretic Peptide 50.6 03/22/2019: ALT 31; Magnesium 2.0 03/23/2019: TSH 1.981 06/07/2019: BUN 20; Creatinine, Ser 1.36; Hemoglobin 14.7; Platelets 173; Potassium 3.8; Sodium 139   Recent Lipid Panel No results found for: CHOL, TRIG, HDL, CHOLHDL, VLDL, LDLCALC, LDLDIRECT  Physical Exam:   VS:  BP 124/82 (BP Location: Left Arm, Patient Position: Sitting, Cuff Size: Large)   Pulse 90   Ht 6\' 3"  (1.905 m)   Wt (!) 426 lb 6.4 oz (193.4 kg)   BMI 53.30 kg/m    Wt Readings from Last 3 Encounters:  06/18/19 (!) 426 lb 6.4 oz (193.4 kg)  06/07/19 (!) 422 lb (191.4 kg)  05/04/19 (!) 426 lb 12.8 oz (193.6 kg)    General: Obese male, no acute distress Heart: Atraumatic, normal size  Eyes: PEERLA, EOMI  Neck:  Supple, no JVD Endocrine: No thryomegaly Cardiac: Normal S1, S2; RRR; no murmurs, rubs, or gallops Lungs: Clear to auscultation bilaterally, no wheezing, rhonchi or rales  Abd: Soft, nontender, no hepatomegaly  Ext: No edema, pulses 2+ Musculoskeletal: No deformities, BUE and BLE strength normal and equal Skin: Warm and dry, no rashes   Neuro: Alert and oriented to person, place, time, and situation, CNII-XII grossly intact, no focal deficits  Psych: Normal  mood and affect   ASSESSMENT:   TOSHUA WISOR is a 59 y.o. male who presents for the following: 1. SVT (supraventricular tachycardia) (HCC)   2. Obesity, morbid, BMI 50 or higher ([place])   3. Other pulmonary embolism without acute cor pulmonale, unspecified chronicity ([place])     PLAN:   1. 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  This resolved with adenosine x1.  No further recurrence.  I suspect this is a one-time episode just in the setting of hyperadrenergic state in the setting of a submassive pulmonary malaise him.  He had no further recurrence.  I see no need for heart monitor at this time.  His ultrasound showed normal left ventricular function.  2. Obesity, morbid, BMI 50 or higher ([place]) -He will continue to diet and exercise.  3. Other pulmonary embolism without acute cor pulmonale, unspecified chronicity ([place]) -This is a second PE for him.  He will remain on lifelong anticoagulation.  He will see [place] on an as-needed basis.   Disposition: Return if symptoms worsen or fail to improve.  Medication Adjustments/Labs and Tests Ordered: Current medicines are reviewed at length with the patient today.  Concerns regarding medicines are outlined above.  No orders of the defined types were placed in this encounter.  No orders of the defined types were placed in this encounter.   Patient Instructions  Medication Instructions:  The current medical regimen is  effective;  continue present plan and medications.  *If you need a refill on your cardiac medications before your next appointment, please call your pharmacy*    Follow-Up: At [facility], you and your health needs are our priority.  As part of our continuing mission to provide you with exceptional heart care, we have created designated Provider Care Teams.  These Care Teams include your primary Cardiologist (physician) and Advanced Practice Providers (APPs -  Physician Assistants and Nurse Practitioners) who all work together to provide you with the care you need, when you need it.  We recommend signing up for the patient portal called "MyChart".  Sign up information is provided on this After Visit Summary.  MyChart is used to connect with patients for Virtual Visits (Telemedicine).  Patients are able to view lab/test results, encounter notes, upcoming appointments, etc.  Non-urgent messages can be sent to your provider as well.   To learn more about what you can do with MyChart, go to [email or web address].    Your next appointment:   As needed  The format for your next appointment:   Either In Person or Virtual  Provider:   Eleonore Chiquito, MD         Signed, Addison Naegeli. Audie Box, [place]  [address], [place], [place] [address] [phone]  06/18/2019 11:59 AM

## 2019-06-18 ENCOUNTER — Ambulatory Visit: Payer: 59 | Admitting: Cardiovascular Disease

## 2019-06-18 ENCOUNTER — Other Ambulatory Visit: Payer: Self-pay

## 2019-06-18 ENCOUNTER — Encounter: Payer: Self-pay | Admitting: Cardiovascular Disease

## 2019-06-18 VITALS — BP 124/82 | HR 90 | Ht 75.0 in | Wt >= 6400 oz

## 2019-06-18 DIAGNOSIS — I471 Supraventricular tachycardia: Secondary | ICD-10-CM | POA: Diagnosis not present

## 2019-06-18 DIAGNOSIS — I2699 Other pulmonary embolism without acute cor pulmonale: Secondary | ICD-10-CM

## 2019-06-18 NOTE — Patient Instructions (Signed)
Medication Instructions:  The current medical regimen is effective;  continue present plan and medications.  *If you need a refill on your cardiac medications before your next appointment, please call your pharmacy*   Follow-Up: At [facility], you and your health needs are our priority.  As part of our continuing mission to provide you with exceptional heart care, we have created designated Provider Care Teams.  These Care Teams include your primary Cardiologist (physician) and Advanced Practice Providers (APPs -  Physician Assistants and Nurse Practitioners) who all work together to provide you with the care you need, when you need it.  We recommend signing up for the patient portal called "MyChart".  Sign up information is provided on this After Visit Summary.  MyChart is used to connect with patients for Virtual Visits (Telemedicine).  Patients are able to view lab/test results, encounter notes, upcoming appointments, etc.  Non-urgent messages can be sent to your provider as well.   To learn more about what you can do with MyChart, go to [email or web address].    Your next appointment:   As needed  The format for your next appointment:   Either In Person or Virtual  Provider:   [place] O'Neal, MD      

## 2019-08-22 ENCOUNTER — Emergency Department (HOSPITAL_BASED_OUTPATIENT_CLINIC_OR_DEPARTMENT_OTHER): Payer: 59

## 2019-08-22 ENCOUNTER — Other Ambulatory Visit: Payer: Self-pay

## 2019-08-22 ENCOUNTER — Emergency Department (HOSPITAL_COMMUNITY): Payer: 59

## 2019-08-22 ENCOUNTER — Emergency Department (HOSPITAL_COMMUNITY)
Admission: EM | Admit: 2019-08-22 | Discharge: 2019-08-22 | Disposition: A | Discharge status: Home / Self Care | Payer: 59 | Attending: Emergency Medicine | Admitting: Emergency Medicine

## 2019-08-22 ENCOUNTER — Encounter (HOSPITAL_COMMUNITY): Payer: Self-pay | Admitting: Emergency Medicine

## 2019-08-22 DIAGNOSIS — Z7901 Long term (current) use of anticoagulants: Secondary | ICD-10-CM | POA: Diagnosis not present

## 2019-08-22 DIAGNOSIS — M79609 Pain in unspecified limb: Secondary | ICD-10-CM

## 2019-08-22 DIAGNOSIS — I10 Essential (primary) hypertension: Secondary | ICD-10-CM | POA: Insufficient documentation

## 2019-08-22 DIAGNOSIS — R0789 Other chest pain: Secondary | ICD-10-CM | POA: Diagnosis present

## 2019-08-22 DIAGNOSIS — M79605 Pain in left leg: Secondary | ICD-10-CM | POA: Diagnosis not present

## 2019-08-22 DIAGNOSIS — Z79899 Other long term (current) drug therapy: Secondary | ICD-10-CM | POA: Diagnosis not present

## 2019-08-22 DIAGNOSIS — R0602 Shortness of breath: Secondary | ICD-10-CM | POA: Insufficient documentation

## 2019-08-22 LAB — BASIC METABOLIC PANEL
Anion gap: 10 (ref 5–15)
BUN: 21 mg/dL — ABNORMAL HIGH (ref 6–20)
CO2: 25 mmol/L (ref 22–32)
Calcium: 9 mg/dL (ref 8.9–10.3)
Chloride: 103 mmol/L (ref 98–111)
Creatinine, Ser: 1.56 mg/dL — ABNORMAL HIGH (ref 0.61–1.24)
GFR calc Af Amer: 55 mL/min — ABNORMAL LOW (ref >60)
GFR calc non Af Amer: 48 mL/min — ABNORMAL LOW (ref >60)
Glucose, Bld: 113 mg/dL — ABNORMAL HIGH (ref 70–99)
Potassium: 3.8 mmol/L (ref 3.5–5.1)
Sodium: 138 mmol/L (ref 135–145)

## 2019-08-22 LAB — CBC
HCT: 44.7 % (ref 39.0–52.0)
Hemoglobin: 14.3 g/dL (ref 13.0–17.0)
MCH: 28.8 pg (ref 26.0–34.0)
MCHC: 32 g/dL (ref 30.0–36.0)
MCV: 90.1 fL (ref 80.0–100.0)
Platelets: 159 10*3/uL (ref 150–400)
RBC: 4.96 MIL/uL (ref 4.22–5.81)
RDW: 13.2 % (ref 11.5–15.5)
WBC: 4.9 10*3/uL (ref 4.0–10.5)
nRBC: 0 % (ref 0.0–0.2)

## 2019-08-22 LAB — TROPONIN I (HIGH SENSITIVITY)
Troponin I (High Sensitivity): 5 ng/L (ref <18)
Troponin I (High Sensitivity): 6 ng/L (ref <18)

## 2019-08-22 MED ORDER — SODIUM CHLORIDE 0.9 % IV BOLUS (SEPSIS)
1000.0000 mL | Freq: Once | INTRAVENOUS | Status: AC
  Administered 2019-08-22: 1000 mL via INTRAVENOUS

## 2019-08-22 MED ORDER — SODIUM CHLORIDE 0.9% FLUSH
3.0000 mL | Freq: Once | INTRAVENOUS | Status: AC
  Administered 2019-08-22: 3 mL via INTRAVENOUS

## 2019-08-22 MED ORDER — IOHEXOL 350 MG/ML SOLN
80.0000 mL | Freq: Once | INTRAVENOUS | Status: AC | PRN
  Administered 2019-08-22: 80 mL via INTRAVENOUS

## 2019-08-22 NOTE — Progress Notes (Signed)
VASCULAR LAB PRELIMINARY  PRELIMINARY  PRELIMINARY  PRELIMINARY  Left lower extremity venous duplex completed.    Preliminary report:  See CV proc for preliminary results.  Gave Dr. Jeanell Sparrow results  Mauro Kaufmann, Ledonna Dormer, RVT 08/22/2019, 8:56 AM

## 2019-08-22 NOTE — ED Provider Notes (Addendum)
TIME SEEN: 5:40 AM  CHIEF COMPLAINT: Chest pain, shortness of breath, left leg pain  HPI: Patient is a 59 year old male with history of sarcoidosis, hypertension, PE and DVT currently on Eliquis who presents to the emergency department with complaints of several days of feeling short of breath, having chest pain that he describes as indigestion and left leg pain that he describes as tingling.  He states his symptoms are mild but they made him concerned that he could have another DVT or PE.  He states when he had his DVT his leg was very painful in the calf and extremely swollen.  He has noted today that he has had some swelling but it is symmetric in both legs.  Swelling is very mild today.  He also reports that after his DVT he was on anticoagulation for 3 months and was taken off.  He then developed a blood clot in his lungs and is now on anticoagulation for the rest of his life.  He has not missed any Eliquis.  He states that when he had his PE he felt extremely short of breath with just minimal exertion.  He states he is not sure if his symptoms today were due to allergies.  He is not having any chest pain currently and no shortness of breath at rest.  ROS: See HPI Constitutional: no fever  Eyes: no drainage  ENT: no runny nose   Cardiovascular:   chest pain  Resp: SOB  GI: no vomiting GU: no dysuria Integumentary: no rash  Allergy: no hives  Musculoskeletal: leg swelling  Neurological: no slurred speech ROS otherwise negative  PAST MEDICAL HISTORY/PAST SURGICAL HISTORY:  Past Medical History:  Diagnosis Date  . Allergic rhinitis   . Arrhythmia   . DJD (degenerative joint disease)   . GERD (gastroesophageal reflux disease)    hx of - not current  . Hypertension    takes Losartan and Amlodipine daily  . Insomnia    takes Ambien nightly as needed  . Sarcoidosis   . Sleep apnea     MEDICATIONS:  Prior to Admission medications   Medication Sig Start Date End Date Taking?  Authorizing Provider  amLODipine (NORVASC) 10 MG tablet Take 10 mg by mouth every morning. 05/08/19   [provider]  docusate sodium (COLACE) 100 MG capsule Take 100 mg by mouth every other day as needed for mild constipation.    [provider]  ELIQUIS 5 MG TABS tablet Take 5 mg by mouth 2 (two) times daily. 05/13/19   [provider]  fluticasone (FLONASE) 50 MCG/ACT nasal spray Place 1 spray into both nostrils daily as needed for allergies or rhinitis.    [provider]  loratadine (CLARITIN) 10 MG tablet Take 10 mg by mouth daily as needed for allergies.    [provider]  NON FORMULARY Take 1,000 mg by mouth as needed (sexual enhancement). epimedium    [provider]  NON FORMULARY cpap    [provider]  Omega-3 Fatty Acids (FISH OIL) 1000 MG CAPS Take 1 capsule by mouth daily.      [provider]  valsartan-hydrochlorothiazide (DIOVAN-HCT) 320-12.5 MG tablet Take 1 tablet by mouth daily. 03/30/19   [provider]  vitamin E (VITAMIN E) 400 UNIT capsule Take 400 Units by mouth daily.      [provider]  zolpidem (AMBIEN) 10 MG tablet Take 10 mg by mouth at bedtime as needed for sleep.    [provider]    ALLERGIES:  No Known Allergies  SOCIAL HISTORY:  Social History   Tobacco Use  . Smoking status: Never Smoker  . Smokeless tobacco: Never Used  Substance Use Topics  . Alcohol use: Yes    Comment: occasionally    FAMILY HISTORY: Family History  Problem Relation Age of Onset  . Rectal cancer Father     EXAM: BP 132/87 (BP Location: Left Arm)   Pulse 94   Temp 98.2 F (36.8 C) (Oral)   Resp 20   Ht 6\' 3"  (1.905 m)   Wt (!) 186 kg   SpO2 100%   BMI 51.25 kg/m  CONSTITUTIONAL: Alert and oriented and responds appropriately to questions. Well-appearing; well-nourished, obese, extremely pleasant HEAD: Normocephalic EYES: Conjunctivae clear, pupils appear equal,  EOM appear intact ENT: normal nose; moist mucous membranes NECK: Supple, normal ROM CARD: RRR; S1 and S2 appreciated; no murmurs, no clicks, no rubs, no gallops RESP: Normal chest excursion without splinting, intermittently mildly tachypneic; breath sounds clear and equal bilaterally; no wheezes, no rhonchi, no rales, no hypoxia or respiratory distress, speaking full sentences ABD/GI: Normal bowel sounds; non-distended; soft, non-tender, no rebound, no guarding, no peritoneal signs, no hepatosplenomegaly BACK:  The back appears normal EXT: Normal ROM in all joints; no deformity noted, minimal edema in bilateral lower extremities that is symmetric, no calf tenderness SKIN: Normal color for age and race; warm; no rash on exposed skin NEURO: Moves all extremities equally PSYCH: The patient's mood and manner are appropriate.   MEDICAL DECISION MAKING: Patient here with concerns for recurrent PE, DVT.  He does have significant history but has never developed a clot while on anticoagulation.  Labs reviewed/interpreted and show that he has chronic kidney disease which is stable.  His first troponin is negative.  His EKG has been reviewed/interpreted and shows no ischemia.  Chest x-ray reviewed/interpreted and shows no active cardiopulmonary disease.  Have recommended CTA of the chest to rule out PE given the symptoms.  He agrees with this plan.  Will hydrate given his chronic kidney disease.  Will also obtain Doppler of the left lower extremity.  ED PROGRESS: 7:30 AM  Patient's repeat troponin is negative.  CTA chest, Doppler of the lower extremity pending.    9:10 AM  Pt's CT chest shows no acute abnormality and Doppler negative for DVT.  Patient reports he is asymptomatic and continues to be hemodynamically stable.  Reports he has plenty of Eliquis at home.  I feel he is safe for discharge home.  He is comfortable with this plan.  Symptoms seem atypical for dissection, ACS.   At this time, I do not  feel there is any life-threatening condition present. I have reviewed, interpreted and discussed all results (EKG, imaging, lab, urine as appropriate) and exam findings with patient/family. I have reviewed nursing notes and appropriate previous records.  I feel the patient is safe to be discharged home without further emergent workup and can continue workup as an outpatient as needed. Discussed usual and customary return precautions. Patient/family verbalize understanding and are comfortable with this plan.  Outpatient follow-up has been provided as needed. All questions have been answered.    EKG Interpretation  Date/Time:  Sunday Aug 22 2019 03:43:20 EDT Ventricular Rate:  87 PR Interval:  170 QRS Duration: 94 QT Interval:  352 QTC Calculation: 423 R Axis:   4 Text Interpretation: Normal sinus rhythm Minimal voltage criteria for LVH, may be normal variant ( R in  aVL ) Inferior infarct , age undetermined Abnormal ECG No significant change since last tracing Confirmed by Pryor Curia [phone]) on 08/22/2019 5:40:21 AM        Patsy [place] Waldo Laine was evaluated in Emergency Department on 08/22/2019 for the symptoms described in the history of present illness. He was evaluated in the context of the global COVID-19 pandemic, which necessitated consideration that the patient might be at risk for infection with the SARS-CoV-2 virus that causes COVID-19. Institutional protocols and algorithms that pertain to the evaluation of patients at risk for COVID-19 are in a state of rapid change based on information released by regulatory bodies including the CDC and federal and state organizations. These policies and algorithms were followed during the patient's care in the ED.      Raeley Gilmore, Delice Bison, DO 08/22/19 0731    Antaniya Venuti, Delice Bison, DO 08/22/19 [phone]

## 2019-08-22 NOTE — Discharge Instructions (Addendum)
Your labs were reassuring today including 2 normal sets of cardiac labs.  CT scan of chest showed no blood clots and ultrasound of your left leg was also normal.  I feel you are safe to be discharged home.  If you have any return of symptoms, please follow-up with your primary care doctor.  If you feel your symptoms are getting worse, please return to the emergency department.

## 2019-08-22 NOTE — ED Triage Notes (Signed)
Pt c/o chest tightness for the past few days, denies any SOB.

## 2019-08-22 NOTE — ED Provider Notes (Signed)
  Physical Exam  BP 125/77   Pulse 79   Temp 98.2 F (36.8 C) (Oral)   Resp (!) 21   Ht 1.905 m (6\' 3" )   Wt (!) 186 kg   SpO2 97%   BMI 51.25 kg/m   Physical Exam  ED Course/Procedures    Received sign out from Dr. Leonides Schanz Procedures  MDM  59 yo male ho dvt on eliquis presents complaining of mild cp, tingling, ekg and troponins without acute changes peding cta and doppler. Plan d/c if negative.      Pattricia Boss, MD 08/22/19 1539

## 2019-10-13 IMAGING — CT CT ANGIO CHEST
2 of 6 series · 19 of 36 positions shown · IV contrast (iopamidol)
Comparison: Chest radiograph 04/23/2016

CLINICAL DATA: DVT 1 week prior.

EXAM:
CT ANGIOGRAPHY CHEST WITH CONTRAST
TECHNIQUE: Multidetector CT imaging of the chest was performed using the
standard protocol during bolus administration of intravenous
contrast. Multiplanar CT image reconstructions and MIPs were
obtained to evaluate the vascular anatomy.
CONTRAST:  100mL H9OPL8-KJV IOPAMIDOL (H9OPL8-KJV) INJECTION 76%

[Series 6: pe thins · axial · 0.91mm/px · z∈[-308,-25]mm · 18 of 447 slices shown]
[im 21/447  lung]
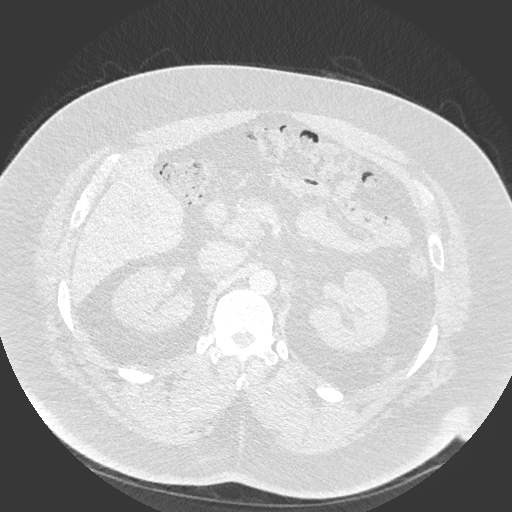
[im 41/447  mediastinal]
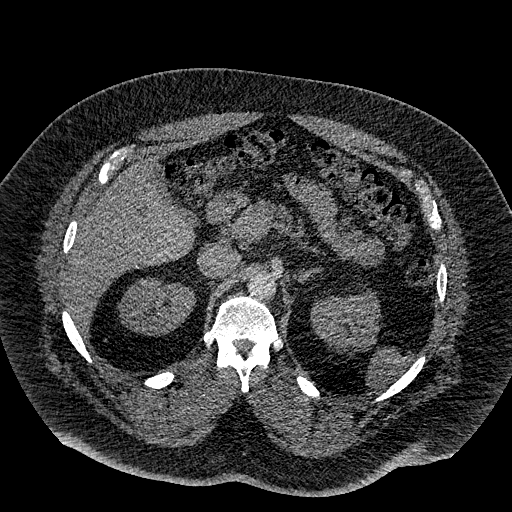
[im 61/447  lung]
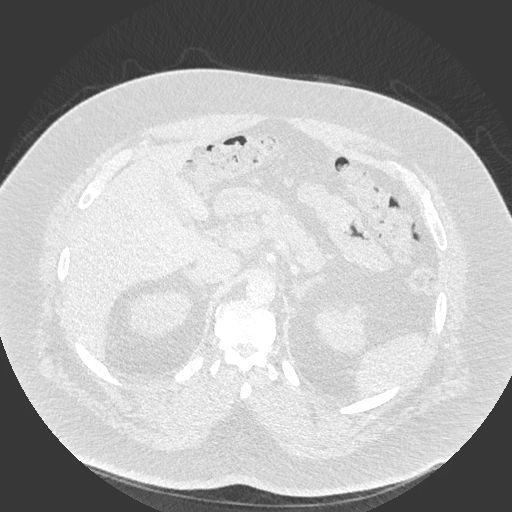
[im 102/447  mediastinal]
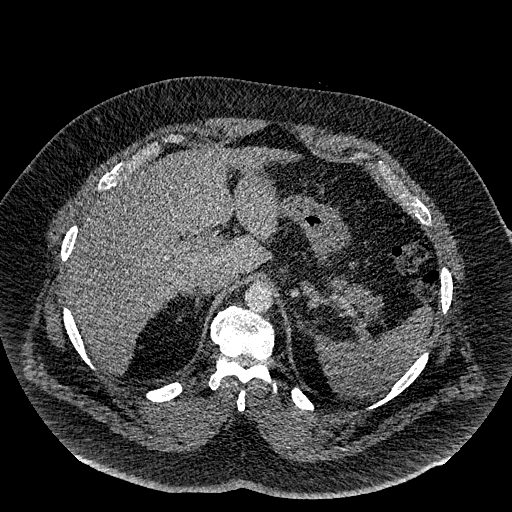
[im 122/447  lung]
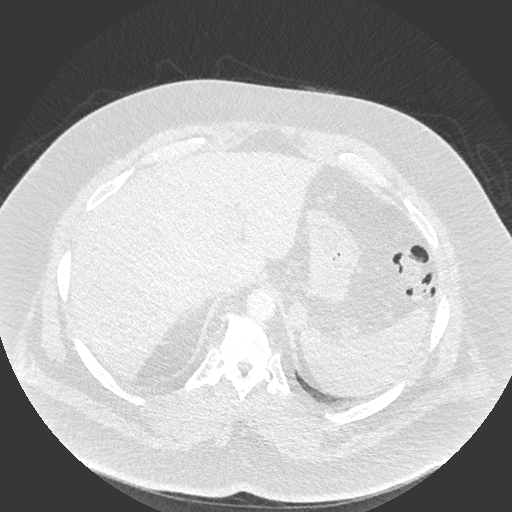
[im 142/447  mediastinal]
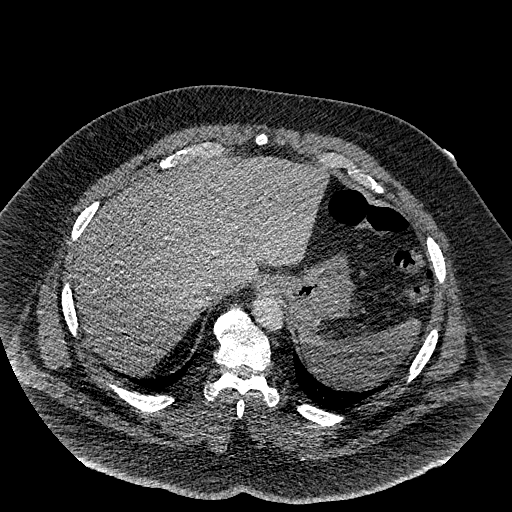
[im 163/447  lung]
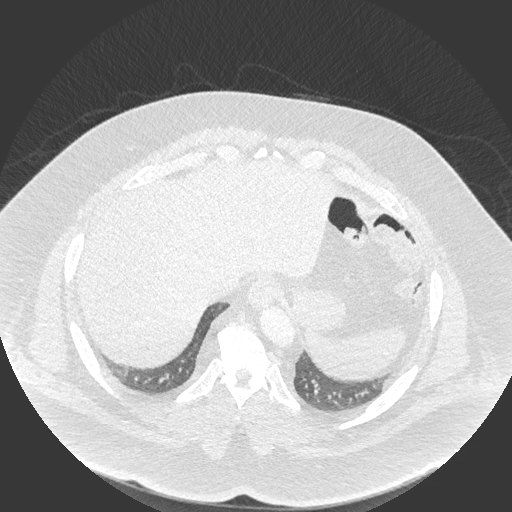
[im 183/447  mediastinal]
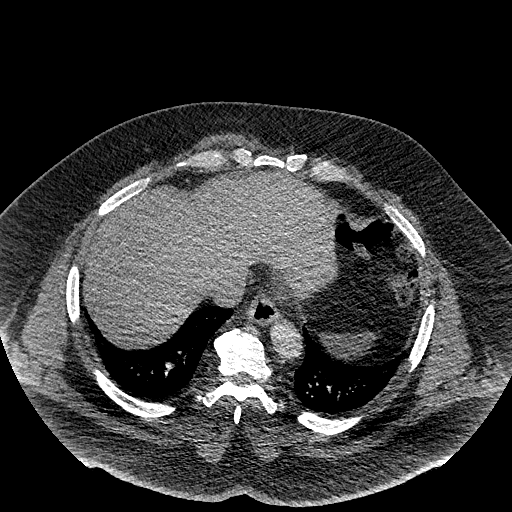
[im 203/447  lung]
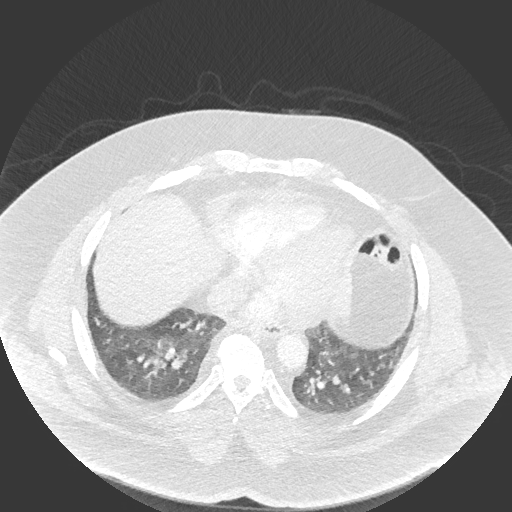
[im 244/447  mediastinal]
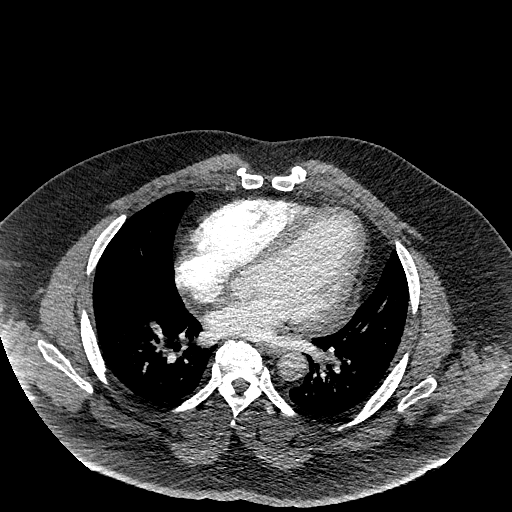
[im 264/447  lung]
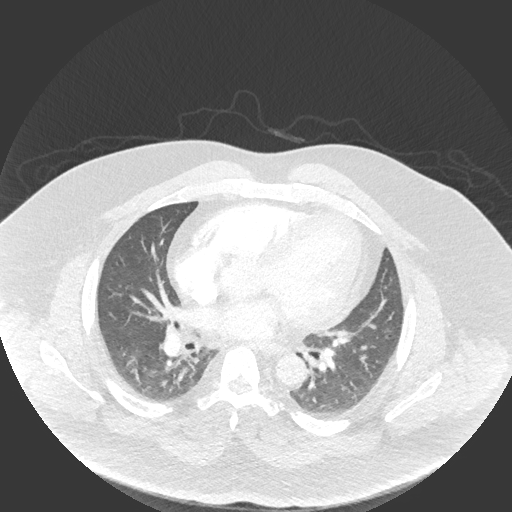
[im 284/447  mediastinal]
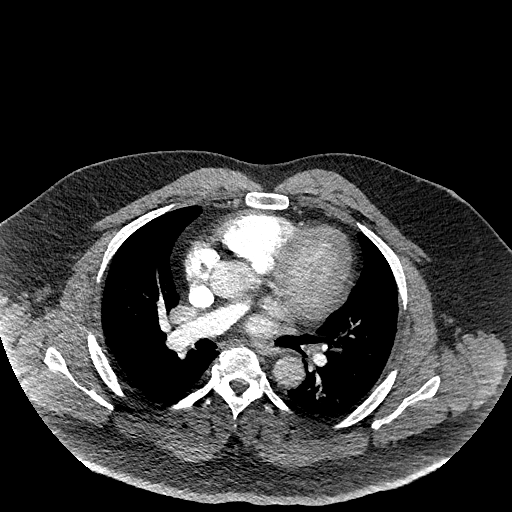
[im 305/447  lung]
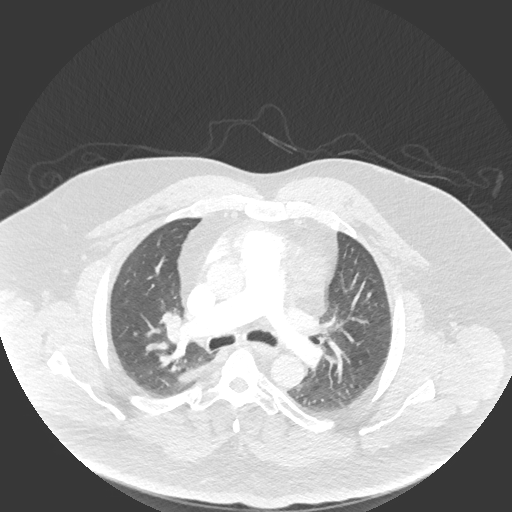
[im 325/447  mediastinal]
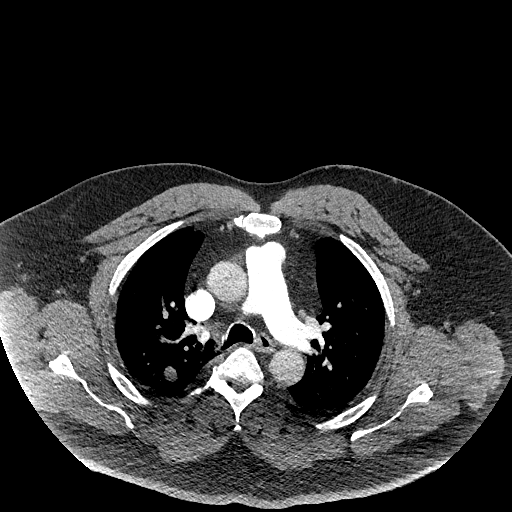
[im 345/447  lung]
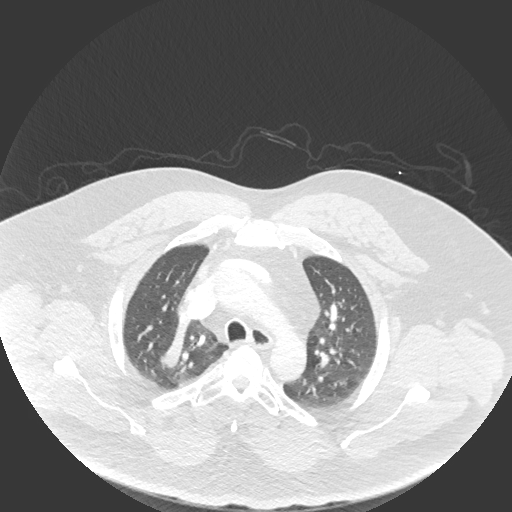
[im 386/447  mediastinal]
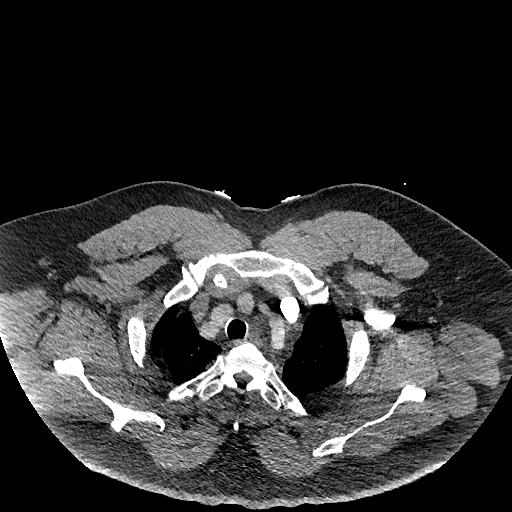
[im 406/447  lung]
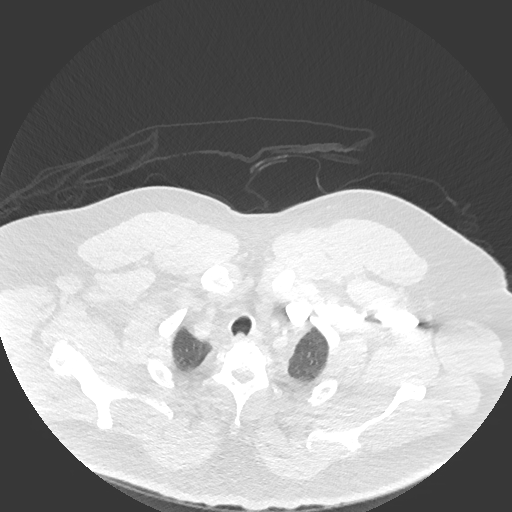
[im 426/447  mediastinal]
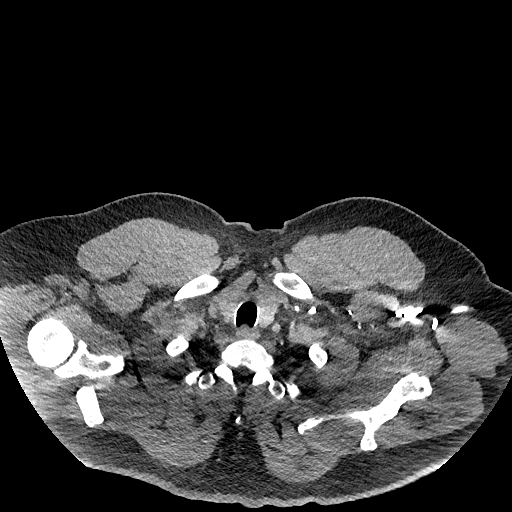

[Series 8: pe 2mm cor · coronal · 0.62mm/px · 1 of 101 slices shown]
[im 51/101  mediastinal]
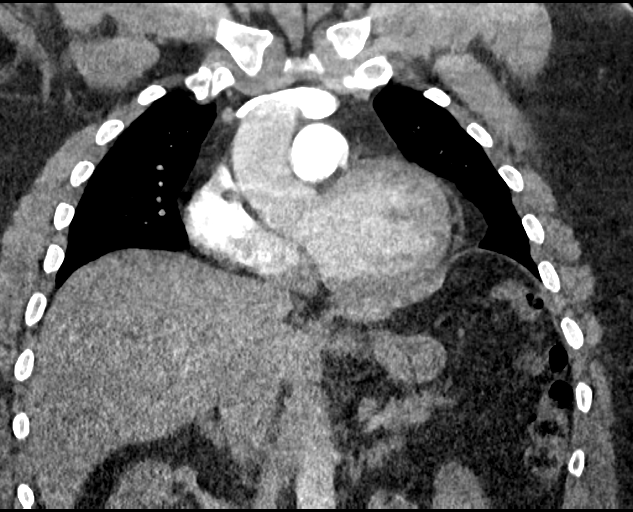

[19 of 36 positions shown; findings below may reference images not displayed]

FINDINGS: Cardiovascular:

No filling defect within the LEFT or RIGHT main pulmonary artery.
The segmental pulmonary arteries are poorly opacified and cannot be
adequately evaluated.

No RIGHT ventricular strain.

Large vein noted in the azygos fissure.

Mediastinum/Nodes: No axillary supraclavicular adenopathy. No
mediastinal hilar adenopathy. No pericardial effusion.

Lungs/Pleura: No pulmonary edema. No pulmonary infarction. No
infiltrate

Upper Abdomen: Limited view of the liver, kidneys, pancreas are
unremarkable. Normal adrenal glands. Benign cysts in the RIGHT
hepatic lobe

Musculoskeletal: No aggressive osseous lesion

Review of the MIP images confirms the above findings.
IMPRESSION: 1. No central pulmonary embolism embolism. Segmental branches of the
pulmonary arteries are poorly evaluated. No RIGHT ventricular
strain. Consider V/Q scan if continued concern for pulmonary
embolism.
2. No pulmonary infarction.  No pneumonia or edema.

## 2020-05-03 ENCOUNTER — Ambulatory Visit: Payer: 59 | Admitting: Internal Medicine

## 2020-05-10 ENCOUNTER — Encounter: Payer: Self-pay | Admitting: Physician Assistant

## 2020-05-24 ENCOUNTER — Other Ambulatory Visit: Payer: Self-pay

## 2020-05-24 ENCOUNTER — Ambulatory Visit (INDEPENDENT_AMBULATORY_CARE_PROVIDER_SITE_OTHER): Payer: BC Managed Care – PPO | Admitting: Physician Assistant

## 2020-05-24 ENCOUNTER — Encounter: Payer: Self-pay | Admitting: Physician Assistant

## 2020-05-24 VITALS — BP 130/78 | HR 95 | Ht 75.0 in | Wt >= 6400 oz

## 2020-05-24 DIAGNOSIS — K921 Melena: Secondary | ICD-10-CM

## 2020-05-24 DIAGNOSIS — Z1211 Encounter for screening for malignant neoplasm of colon: Secondary | ICD-10-CM

## 2020-05-24 NOTE — Patient Instructions (Signed)
If you are age 62 or older, your body mass index should be between 23-30. Your Body mass index is 55.62 kg/m. If this is out of the aforementioned range listed, please consider follow up with your Primary Care Provider.  If you are age 51 or younger, your body mass index should be between 19-25. Your Body mass index is 55.62 kg/m. If this is out of the aformentioned range listed, please consider follow up with your Primary Care Provider.   We will call you to schedule your Colonoscopy at the hospital.  Thank you for choosing me and [facility].  Ellouise Newer, PA-C

## 2020-05-24 NOTE — Progress Notes (Signed)
Chief Complaint: Hematochezia  HPI:    [name] is a 60 year old African-American male with past medical history as listed below including reflux and previous PE diagnosed 03/20/2019 on Eliquis, who was referred to me by Harlan Stains, MD for a complaint of hematochezia.      11/14/2009 colonoscopy at [place] with Dr.Bray, was normal to the terminal ileum.  Repeat recommended in 10 years.    12/31/2019 patient seen by his PCP Dr. Paulita Fujita for rectal bleeding.  It was noted that he had been seen by his PCP and felt to have internal hemorrhoids and given Anusol HC.  The symptoms had resolved.  Was recommended he have a colonoscopy.    04/24/2020 CMP normal, CBC normal.    Today, the patient presents to clinic and tells me that he was supposed to have a colonoscopy with Dr. Benson Norway but they could not get him in for 6 months so he is here now.  Describes some hematochezia back about 5 or so months ago which cleared up with Anusol suppositories from his PCP.  He has seen none since then.  Tells me he is due for a colonoscopy for screening purposes.    Denies fever, chills, weight loss, change in bowel habits, heartburn, reflux or symptoms that awaken him at night.  Past Medical History:  Diagnosis Date  . Allergic rhinitis   . Arrhythmia   . DJD (degenerative joint disease)   . GERD (gastroesophageal reflux disease)    hx of - not current  . Hypertension    takes Losartan and Amlodipine daily  . Insomnia    takes Ambien nightly as needed  . Sarcoidosis   . Sleep apnea     Past Surgical History:  Procedure Laterality Date  . COLONOSCOPY    . 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 amLODipine (NORVASC) 10 MG tablet Take  10 mg by mouth daily.     Marland Kitchen docusate sodium (COLACE) 100 MG capsule Take 100 mg by mouth every other day as needed for mild constipation.    Marland Kitchen ELIQUIS 5 MG TABS tablet Take 5 mg by mouth 2 (two) times daily.    . fluticasone (FLONASE) 50 MCG/ACT nasal spray Place 1 spray into both nostrils daily as needed for allergies or rhinitis.    Marland Kitchen loratadine (CLARITIN) 10 MG tablet Take 10 mg by mouth daily as needed for allergies.    . NON FORMULARY cpap    . Omega-3 Fatty Acids (FISH OIL) 1000 MG CAPS Take 1 capsule by mouth daily.      . valsartan-hydrochlorothiazide (DIOVAN-HCT) 320-12.5 MG tablet Take 1 tablet by mouth daily.    . vitamin E (VITAMIN E) 400 UNIT capsule Take 400 Units by mouth daily.      Marland Kitchen zolpidem (AMBIEN) 10 MG tablet Take 10 mg by mouth at bedtime.      No current facility-administered medications for this visit.    Allergies as of 05/24/2020  . (No Known Allergies)    Family History  Problem Relation Age of Onset  . Rectal cancer Father     Social History   Socioeconomic History  . Marital status: Married    Spouse name: Not  on file  . Number of children: 1  . Years of education: Not on file  . Highest education level: Not on file  Occupational History  . Not on file  Tobacco Use  . Smoking status: Never Smoker  . Smokeless tobacco: Never Used  Vaping Use  . Vaping Use: Never used  Substance and Sexual Activity  . Alcohol use: Yes    Comment: occasionally  . Drug use: No  . Sexual activity: Not on file  Other Topics Concern  . Not on file  Social History Narrative  . Not on file   Social Determinants of Health   Financial Resource Strain: Not on file  Food Insecurity: Not on file  Transportation Needs: Not on file  Physical Activity: Not on file  Stress: Not on file  Social Connections: Not on file  Intimate Partner Violence: Not on file    Review of Systems:    Constitutional: No weight loss, fever or chills Skin: No rash  Cardiovascular:  No chest pain Respiratory: No SOB Gastrointestinal: See HPI and otherwise negative Genitourinary: No dysuria Neurological: No headache, dizziness or syncope Musculoskeletal: No new muscle or joint pain Hematologic: No bleeding  Psychiatric: No history of depression or anxiety   Physical Exam:  Vital signs: BP 130/78   Pulse 95   Ht 6\' 3"  (1.905 m)   Wt (!) 445 lb (201.9 kg) Comment: no shoes  BMI 55.62 kg/m   Constitutional:   Pleasant morbidly obese AA male appears to be in NAD, Well developed, Well nourished, alert and cooperative Head:  Normocephalic and atraumatic. Eyes:   PEERL, EOMI. No icterus. Conjunctiva pink. Ears:  Normal auditory acuity. Neck:  Supple Throat: Oral cavity and pharynx without inflammation, swelling or lesion.  Respiratory: Respirations even and unlabored. Lungs clear to auscultation bilaterally.   No wheezes, crackles, or rhonchi.  Cardiovascular: Normal S1, S2. No MRG. Regular rate and rhythm. No peripheral edema, cyanosis or pallor.  Gastrointestinal:  Soft, nondistended, nontender. No rebound or guarding. Normal bowel sounds. No appreciable masses or hepatomegaly. Rectal:  Not performed.  Msk:  Symmetrical without gross deformities. Without edema, no deformity or joint abnormality.  Neurologic:  Alert and  oriented x4;  grossly normal neurologically.  Skin:   Dry and intact without significant lesions or rashes. Psychiatric: Demonstrates good judgement and reason without abnormal affect or behaviors.  RELEVANT LABS AND IMAGING: CBC    Component Value Date/Time   WBC 4.9 08/22/2019 0359   RBC 4.96 08/22/2019 0359   HGB 14.3 08/22/2019 0359   HCT 44.7 08/22/2019 0359   PLT 159 08/22/2019 0359   MCV 90.1 08/22/2019 0359   MCH 28.8 08/22/2019 0359   MCHC 32.0 08/22/2019 0359   RDW 13.2 08/22/2019 0359   LYMPHSABS 1.7 06/07/2019 1809   MONOABS 0.5 06/07/2019 1809   EOSABS 0.2 06/07/2019 1809   BASOSABS 0.0 06/07/2019 1809    CMP      Component Value Date/Time   NA 138 08/22/2019 0359   K 3.8 08/22/2019 0359   CL 103 08/22/2019 0359   CO2 25 08/22/2019 0359   GLUCOSE 113 (H) 08/22/2019 0359   BUN 21 (H) 08/22/2019 0359   CREATININE 1.56 (H) 08/22/2019 0359   CALCIUM 9.0 08/22/2019 0359   PROT 6.8 03/22/2019 0228   ALBUMIN 3.1 (L) 03/22/2019 0228   AST 25 03/22/2019 0228   ALT 31 03/22/2019 0228   ALKPHOS 46 03/22/2019 0228   BILITOT 1.0 03/22/2019 0228   GFRNONAA  48 (L) 08/22/2019 0359   GFRAA 55 (L) 08/22/2019 0359    Assessment: 1.  Screening for colorectal cancer: It has been 10 years since patient's last screening colonoscopy which was normal 2.  History of hematochezia: 4 to 5 months ago, cleared up with Anusol suppositories; most likely hemorrhoids  Plan: 1.  Scheduled patient at the hospital, at the end of March, for a screening colonoscopy due to his BMI with Dr.Pyrtle.  Patient was provided with a detailed list of risks for the procedure and he agrees to proceed. 2.  Patient advised to hold his Xarelto for 2 days prior to time of procedure.  We will communicate with his prescribing physician Dr. Harlan Stains to ensure this is acceptable for him. 3.  Patient to follow in clinic per recommendations after time of procedure.  Ellouise Newer, PA-C [facility] 05/24/2020, 3:30 PM  Cc: Harlan Stains, MD

## 2020-05-25 NOTE — Progress Notes (Signed)
Addendum: Reviewed and agree with assessment and management plan. Dontell Mian M, MD  

## 2020-05-26 ENCOUNTER — Telehealth: Payer: Self-pay

## 2020-05-26 ENCOUNTER — Other Ambulatory Visit: Payer: Self-pay

## 2020-05-26 DIAGNOSIS — Z1211 Encounter for screening for malignant neoplasm of colon: Secondary | ICD-10-CM

## 2020-05-26 DIAGNOSIS — K921 Melena: Secondary | ICD-10-CM

## 2020-05-26 MED ORDER — PLENVU 140 G PO SOLR: ORAL | 0 refills | Status: DC

## 2020-05-26 NOTE — Telephone Encounter (Signed)
Faxed anticoag letter to Dr.White's office

## 2020-05-26 NOTE — Telephone Encounter (Signed)
Called patient and scheduled a Colonoscopy at [facility] 06/29/20 @ 8:45. Patient asked that instructions be mailed to him and he would call back when he received them.

## 2020-05-26 NOTE — Telephone Encounter (Signed)
   ALANDIS BLUEMEL [birth year] [id number]  Dear Dr. Dema Severin :  We have scheduled the above named patient for a(n) Colonoscopy procedure. Our records show that (s)he is on anticoagulation therapy.  Please advise as to whether the patient may come off their therapy of Eliquis  2 days prior to their procedure which is scheduled for 06/29/20.  Please route your response to [facility] or fax response to [phone].  Sincerely,    [facility]

## 2020-05-31 NOTE — Telephone Encounter (Signed)
Received fax back from Dr.White stating patient can hold Eliquis 2 days prior to procedure.

## 2020-06-02 NOTE — Telephone Encounter (Signed)
Called patient and let him know to hold Eliquis 2 days prior to his procedure.Patient stated he understood.

## 2020-06-23 ENCOUNTER — Other Ambulatory Visit: Payer: Self-pay

## 2020-06-23 NOTE — Progress Notes (Signed)
Attempted to obtain medical history via telephone, unable to reach at this time. I left a voicemail to return pre surgical testing department's phone call.  

## 2020-06-26 ENCOUNTER — Other Ambulatory Visit (HOSPITAL_COMMUNITY): Payer: BC Managed Care – PPO

## 2020-06-28 ENCOUNTER — Other Ambulatory Visit (HOSPITAL_COMMUNITY)
Admission: RE | Admit: 2020-06-28 | Discharge: 2020-06-28 | Disposition: A | Discharge status: Home / Self Care | Payer: BC Managed Care – PPO | Source: Ambulatory Visit | Attending: Gastroenterology | Admitting: Gastroenterology

## 2020-06-28 DIAGNOSIS — D122 Benign neoplasm of ascending colon: Secondary | ICD-10-CM | POA: Diagnosis not present

## 2020-06-28 DIAGNOSIS — Z01812 Encounter for preprocedural laboratory examination: Secondary | ICD-10-CM | POA: Insufficient documentation

## 2020-06-28 DIAGNOSIS — Z20822 Contact with and (suspected) exposure to covid-19: Secondary | ICD-10-CM | POA: Insufficient documentation

## 2020-06-28 DIAGNOSIS — Z1211 Encounter for screening for malignant neoplasm of colon: Secondary | ICD-10-CM | POA: Diagnosis present

## 2020-06-28 DIAGNOSIS — Z96642 Presence of left artificial hip joint: Secondary | ICD-10-CM | POA: Diagnosis not present

## 2020-06-28 DIAGNOSIS — D128 Benign neoplasm of rectum: Secondary | ICD-10-CM | POA: Diagnosis not present

## 2020-06-28 LAB — SARS CORONAVIRUS 2 (TAT 6-24 HRS): SARS Coronavirus 2: NEGATIVE

## 2020-06-29 ENCOUNTER — Ambulatory Visit (HOSPITAL_COMMUNITY): Payer: BC Managed Care – PPO | Admitting: Certified Registered"

## 2020-06-29 ENCOUNTER — Ambulatory Visit (HOSPITAL_COMMUNITY)
Admission: RE | Admit: 2020-06-29 | Discharge: 2020-06-29 | Disposition: A | Discharge status: Home / Self Care | Payer: BC Managed Care – PPO | Source: Ambulatory Visit | Attending: Gastroenterology | Admitting: Gastroenterology

## 2020-06-29 ENCOUNTER — Encounter (HOSPITAL_COMMUNITY): Payer: Self-pay | Admitting: Gastroenterology

## 2020-06-29 ENCOUNTER — Encounter (HOSPITAL_COMMUNITY)
Admission: RE | Disposition: A | Discharge status: Home / Self Care | Payer: Self-pay | Source: Ambulatory Visit | Attending: Gastroenterology

## 2020-06-29 ENCOUNTER — Other Ambulatory Visit: Payer: Self-pay

## 2020-06-29 DIAGNOSIS — Z1211 Encounter for screening for malignant neoplasm of colon: Secondary | ICD-10-CM | POA: Diagnosis not present

## 2020-06-29 DIAGNOSIS — K635 Polyp of colon: Secondary | ICD-10-CM | POA: Diagnosis not present

## 2020-06-29 DIAGNOSIS — K621 Rectal polyp: Secondary | ICD-10-CM | POA: Diagnosis not present

## 2020-06-29 DIAGNOSIS — D122 Benign neoplasm of ascending colon: Secondary | ICD-10-CM | POA: Insufficient documentation

## 2020-06-29 DIAGNOSIS — K921 Melena: Secondary | ICD-10-CM

## 2020-06-29 DIAGNOSIS — Z96642 Presence of left artificial hip joint: Secondary | ICD-10-CM | POA: Insufficient documentation

## 2020-06-29 DIAGNOSIS — D128 Benign neoplasm of rectum: Secondary | ICD-10-CM | POA: Insufficient documentation

## 2020-06-29 DIAGNOSIS — Z20822 Contact with and (suspected) exposure to covid-19: Secondary | ICD-10-CM | POA: Insufficient documentation

## 2020-06-29 HISTORY — PX: POLYPECTOMY: SHX5525

## 2020-06-29 HISTORY — PX: COLONOSCOPY WITH PROPOFOL: SHX5780

## 2020-06-29 SURGERY — COLONOSCOPY WITH PROPOFOL
Anesthesia: Monitor Anesthesia Care

## 2020-06-29 MED ORDER — SODIUM CHLORIDE 0.9 % IV SOLN: INTRAVENOUS | Status: DC

## 2020-06-29 MED ORDER — LACTATED RINGERS IV SOLN: INTRAVENOUS | Status: DC

## 2020-06-29 MED ORDER — LIDOCAINE 2% (20 MG/ML) 5 ML SYRINGE
INTRAMUSCULAR | Status: DC | PRN
  Administered 2020-06-29: 60 mg via INTRAVENOUS

## 2020-06-29 MED ORDER — PROPOFOL 500 MG/50ML IV EMUL
INTRAVENOUS | Status: DC | PRN
  Administered 2020-06-29: 150 ug/kg/min via INTRAVENOUS

## 2020-06-29 SURGICAL SUPPLY — 22 items

## 2020-06-29 NOTE — H&P (Signed)
  HPI: This is a man at routine risk for colon cancer.   ROS: complete GI ROS as described in HPI, all other review negative.  Constitutional:  No unintentional weight loss   Past Medical History:  Diagnosis Date  . Allergic rhinitis   . Arrhythmia   . DJD (degenerative joint disease)   . GERD (gastroesophageal reflux disease)    hx of - not current  . Hx of blood clots    legs and lungs  . Hypertension    takes Losartan and Amlodipine daily  . Insomnia    takes Ambien nightly as needed  . Obesity   . Sarcoidosis   . Sleep apnea     Past Surgical History:  Procedure Laterality Date  . COLONOSCOPY    . 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 0.9 %  sodium chloride infusion   Intravenous Continuous Levin Erp, [place]      . lactated ringers infusion   Intravenous Continuous Milus Banister, MD        Allergies as of 05/26/2020  . (No Known Allergies)    Family History  Problem Relation Age of Onset  . Rectal cancer Father   . Breast cancer Sister   . Other Brother        Blood clotting disorder  . Other Brother        Blood clotting disorder    Social History   Socioeconomic History  . Marital status: Married    Spouse name: Not on file  . Number of children: 1  . Years of education: Not on file  . Highest education level: Not on file  Occupational History  . Occupation: non-profit work  Tobacco Use  . Smoking status: Never Smoker  . Smokeless tobacco: Never Used  Vaping Use  . Vaping Use: Never used  Substance and Sexual Activity  . Alcohol use: Yes    Comment: occasionally  . Drug use: No  . Sexual activity: Not on file  Other  Topics Concern  . Not on file  Social History Narrative  . Not on file   Social Determinants of Health   Financial Resource Strain: Not on file  Food Insecurity: Not on file  Transportation Needs: Not on file  Physical Activity: Not on file  Stress: Not on file  Social Connections: Not on file  Intimate Partner Violence: Not on file     Physical Exam: Ht 6' 2.5" (1.892 m)   Wt (!) 195 kg   BMI 54.47 kg/m  Constitutional: generally well-appearing Psychiatric: alert and oriented x3 Abdomen: soft, nontender, nondistended, no obvious ascites, no peritoneal signs, normal bowel sounds No peripheral edema noted in lower extremities  Assessment and plan: 60 y.o. male with routine risk for colon canccer  Massive obesity and on a blood thinner.  For colonoscopy today.  Please see the "Patient Instructions" section for addition details about the plan.  Owens Loffler, MD [facility] 06/29/2020, 7:51 AM

## 2020-06-29 NOTE — Op Note (Signed)
[facility] Patient Name: Brent Mccoy Procedure Date: 06/29/2020 MRN: [id number] Attending MD: Milus Banister , MD Date of Birth: [birth year] CSN: [id number] Age: 60 Admit Type: Outpatient Procedure:                Colonoscopy Indications:              Screening for colorectal malignant neoplasm Providers:                Milus Banister, MD, Baird Cancer, RN, Lesia Sago, Technician Referring MD:              Medicines:                Monitored Anesthesia Care Complications:            No immediate complications. Estimated blood loss:                            None. Estimated Blood Loss:     Estimated blood loss: none. Procedure:                Pre-Anesthesia Assessment:                           - Prior to the procedure, a History and Physical                            was performed, and patient medications and                            allergies were reviewed. The patient's tolerance of                            previous anesthesia was also reviewed. The risks                            and benefits of the procedure and the sedation                            options and risks were discussed with the patient.                            All questions were answered, and informed consent                            was obtained. Prior Anticoagulants: The patient has                            taken Eliquis (apixaban), last dose was 2 days                            prior to procedure. ASA Grade Assessment: IV - A  patient with severe systemic disease that is a                            constant threat to life. After reviewing the risks                            and benefits, the patient was deemed in                            satisfactory condition to undergo the procedure.                           After obtaining informed consent, the colonoscope                            was passed under direct  vision. Throughout the                            procedure, the patient's blood pressure, pulse, and                            oxygen saturations were monitored continuously. The                            CF-HQ190L ([id number]) Olympus colonoscope was                            introduced through the anus and advanced to the the                            cecum, identified by appendiceal orifice and                            ileocecal valve. The colonoscopy was performed                            without difficulty. The patient tolerated the                            procedure well. The quality of the bowel                            preparation was good. The ileocecal valve,                            appendiceal orifice, and rectum were photographed. Scope In: 8:37:33 AM Scope Out: 8:52:46 AM Scope Withdrawal Time: 0 hours 11 minutes 19 seconds  Total Procedure Duration: 0 hours 15 minutes 13 seconds  Findings:      Three sessile polyps were found in the rectum and ascending colon. The       polyps were 3 to 6 mm in size. These polyps were removed with a cold       snare. Resection and retrieval were complete.      The exam was otherwise without  abnormality on direct and retroflexion       views. Impression:               - Three 3 to 6 mm polyps in the rectum and in the                            ascending colon, removed with a cold snare.                            Resected and retrieved.                           - The examination was otherwise normal on direct                            and retroflexion views. Moderate Sedation:      Not Applicable - Patient had care per Anesthesia. Recommendation:           - Patient has a contact number available for                            emergencies. The signs and symptoms of potential                            delayed complications were discussed with the                            patient. Return to normal activities tomorrow.                             Written discharge instructions were provided to the                            patient.                           - Resume previous diet.                           - Continue present medications. It is safe to                            resume your eliquis tomorrow.                           - Await pathology results. Procedure Code(s):        --- Professional ---                           [phone], Colonoscopy, flexible; with removal of                            tumor(s), polyp(s), or other lesion(s) by snare                            technique Diagnosis Code(s):        ---  Professional ---                           Z12.11, Encounter for screening for malignant                            neoplasm of colon                           K62.1, Rectal polyp                           K63.5, Polyp of colon CPT copyright 2019 American Medical Association. All rights reserved. The codes documented in this report are preliminary and upon coder review may  be revised to meet current compliance requirements. Milus Banister, MD 06/29/2020 8:56:25 AM This report has been signed electronically. Number of Addenda: 0

## 2020-06-29 NOTE — Transfer of Care (Signed)
Immediate Anesthesia Transfer of Care Note  Patient: Brent Mccoy  Procedure(s) Performed: COLONOSCOPY WITH PROPOFOL (N/A ) POLYPECTOMY  Patient Location: Endoscopy Unit  Anesthesia Type:MAC  Level of Consciousness: awake, alert , oriented and patient cooperative  Airway & Oxygen Therapy: Patient Spontanous Breathing and Patient connected to face mask oxygen  Post-op Assessment: Report given to RN and Post -op Vital signs reviewed and stable  Post vital signs: Reviewed and stable  Last Vitals:  Vitals Value Taken Time  BP    Temp    Pulse 91 06/29/20 0900  Resp 20 06/29/20 0900  SpO2 97 % 06/29/20 0900  Vitals shown include unvalidated device data.  Last Pain:  Vitals:   06/29/20 0818  TempSrc: Oral  PainSc: 0-No pain         Complications: No complications documented.

## 2020-06-29 NOTE — Anesthesia Preprocedure Evaluation (Signed)
Anesthesia Evaluation  Patient identified by MRN, date of birth, ID band Patient awake    Reviewed: Allergy & Precautions, NPO status , Patient's Chart, lab work & pertinent test results  Airway Mallampati: II  TM Distance: <3 FB Neck ROM: Full    Dental no notable dental hx.    Pulmonary sleep apnea and Continuous Positive Airway Pressure Ventilation ,    Pulmonary exam normal breath sounds clear to auscultation + decreased breath sounds      Cardiovascular hypertension, Pt. on medications Normal cardiovascular exam Rhythm:Regular Rate:Normal     Neuro/Psych negative neurological ROS  negative psych ROS   GI/Hepatic negative GI ROS, Neg liver ROS,   Endo/Other  Morbid obesitysarcoidosis  Renal/GU negative Renal ROS  negative genitourinary   Musculoskeletal negative musculoskeletal ROS (+)   Abdominal (+) + obese,   Peds negative pediatric ROS (+)  Hematology negative hematology ROS (+)   Anesthesia Other Findings   Reproductive/Obstetrics negative OB ROS                             Anesthesia Physical Anesthesia Plan  ASA: IV  Anesthesia Plan: MAC   Post-op Pain Management:    Induction: Intravenous  PONV Risk Score and Plan: Propofol infusion  Airway Management Planned: Simple Face Mask  Additional Equipment:   Intra-op Plan:   Post-operative Plan:   Informed Consent: I have reviewed the patients History and Physical, chart, labs and discussed the procedure including the risks, benefits and alternatives for the proposed anesthesia with the patient or authorized representative who has indicated his/her understanding and acceptance.     Dental advisory given  Plan Discussed with: CRNA and Surgeon  Anesthesia Plan Comments:         Anesthesia Quick Evaluation

## 2020-06-29 NOTE — Anesthesia Postprocedure Evaluation (Signed)
Anesthesia Post Note  Patient: Brent Mccoy  Procedure(s) Performed: COLONOSCOPY WITH PROPOFOL (N/A ) POLYPECTOMY     Patient location during evaluation: PACU Anesthesia Type: MAC Level of consciousness: awake and alert Pain management: pain level controlled Vital Signs Assessment: post-procedure vital signs reviewed and stable Respiratory status: spontaneous breathing, nonlabored ventilation, respiratory function stable and patient connected to nasal cannula oxygen Cardiovascular status: stable and blood pressure returned to baseline Postop Assessment: no apparent nausea or vomiting Anesthetic complications: no   No complications documented.  Last Vitals:  Vitals:   06/29/20 0910 06/29/20 0920  BP: 139/87 134/75  Pulse: 89 82  Resp: 17 17  Temp:    SpO2: 97% 95%    Last Pain:  Vitals:   06/29/20 0920  TempSrc:   PainSc: 0-No pain                 Obrian Bulson S

## 2020-06-29 NOTE — Discharge Instructions (Signed)

## 2020-06-30 ENCOUNTER — Encounter (HOSPITAL_COMMUNITY): Payer: Self-pay | Admitting: Gastroenterology

## 2020-06-30 LAB — SURGICAL PATHOLOGY

## 2020-07-10 ENCOUNTER — Telehealth: Payer: Self-pay | Admitting: Gastroenterology

## 2020-07-10 NOTE — Telephone Encounter (Signed)
I have called and provided the following information to the pt    2 of your polyps were typical adenomatous polyp and the other was hyperplastic. You will need a surveillance colonoscopy in 7 years. My office will help to coordinate that.    All questions answered and recall entered

## 2020-07-10 NOTE — Telephone Encounter (Signed)
Pt called asking about path results. He stated that he cannot access my chart so would like a call instead.

## 2020-07-14 ENCOUNTER — Other Ambulatory Visit (HOSPITAL_COMMUNITY): Payer: BC Managed Care – PPO

## 2020-07-18 ENCOUNTER — Encounter (HOSPITAL_COMMUNITY): Payer: Self-pay

## 2020-07-18 ENCOUNTER — Ambulatory Visit (HOSPITAL_COMMUNITY): Admit: 2020-07-18 | Payer: BC Managed Care – PPO | Admitting: Internal Medicine

## 2020-07-18 SURGERY — COLONOSCOPY WITH PROPOFOL
Anesthesia: Monitor Anesthesia Care

## 2020-08-08 ENCOUNTER — Other Ambulatory Visit (HOSPITAL_COMMUNITY): Payer: Self-pay

## 2020-08-08 ENCOUNTER — Encounter: Payer: Self-pay | Admitting: Oncology

## 2020-08-08 ENCOUNTER — Other Ambulatory Visit: Payer: Self-pay | Admitting: Oncology

## 2020-08-08 ENCOUNTER — Telehealth: Payer: Self-pay | Admitting: Oncology

## 2020-08-08 DIAGNOSIS — U071 COVID-19: Secondary | ICD-10-CM

## 2020-08-08 MED ORDER — MOLNUPIRAVIR EUA 200MG CAPSULE
4.0000 | ORAL_CAPSULE | Freq: Two times a day (BID) | ORAL | 0 refills | Status: AC
  Filled 2020-08-08: qty 40, 5d supply, fill #0

## 2020-08-08 NOTE — Telephone Encounter (Signed)
Outpatient Oral COVID Treatment Note  I connected with Brent Mccoy on 08/08/2020/2:10 PM by telephone and verified that I am speaking with the correct person using two identifiers.  I discussed the limitations, risks, security, and privacy concerns of performing an evaluation and management service by telephone and the availability of in person appointments. I also discussed with the patient that there may be a patient responsible charge related to this service. The patient expressed understanding and agreed to proceed.  Patient location: Home Provider location: Clinic   Diagnosis: COVID-19 infection  Purpose of visit: Discussion of potential use of Molnupiravir or Paxlovid, a new treatment for mild to moderate COVID-19 viral infection in non-hospitalized patients.   Subjective: Patient is a 60 y.o. male who has been diagnosed with COVID 19 viral infection.  Their symptoms began on 08/07/20.  Past Medical History:  Diagnosis Date  . Allergic rhinitis   . Arrhythmia   . DJD (degenerative joint disease)   . GERD (gastroesophageal reflux disease)    hx of - not current  . Hx of blood clots    legs and lungs  . Hypertension    takes Losartan and Amlodipine daily  . Insomnia    takes Ambien nightly as needed  . Obesity   . Sarcoidosis   . Sleep apnea     Not on File   Current Outpatient Medications:  .  amLODipine (NORVASC) 10 MG tablet, Take 10 mg by mouth daily. , Disp: , Rfl:  .  diphenhydrAMINE (BENADRYL) 25 mg capsule, Take 25 mg by mouth at bedtime., Disp: , Rfl:  .  docusate sodium (COLACE) 100 MG capsule, Take 100 mg by mouth daily., Disp: , Rfl:  .  ELIQUIS 5 MG TABS tablet, Take 5 mg by mouth 2 (two) times daily., Disp: , Rfl:  .  fluticasone (FLONASE) 50 MCG/ACT nasal spray, Place 1 spray into both nostrils daily as needed for allergies or rhinitis., Disp: , Rfl:  .  Ginkgo Biloba (GINKOBA PO), Take 125 mg by mouth daily., Disp: , Rfl:  .  loratadine (CLARITIN) 10  MG tablet, Take 10 mg by mouth daily as needed for allergies., Disp: , Rfl:  .  Misc Natural Products (HORNY GOAT WEED PO), Take 1 capsule by mouth daily as needed (sexual enhancement)., Disp: , Rfl:  .  Multiple Vitamins-Minerals (MULTIVITAMIN WITH MINERALS) tablet, Take 1 tablet by mouth 3 (three) times a week., Disp: , Rfl:  .  NON FORMULARY, cpap, Disp: , Rfl:  .  triamcinolone (KENALOG) 0.1 %, Apply 1 application topically 2 (two) times daily as needed (irritation)., Disp: , Rfl:  .  valsartan-hydrochlorothiazide (DIOVAN-HCT) 320-12.5 MG tablet, Take 1 tablet by mouth daily., Disp: , Rfl:  .  vitamin E 180 MG (400 UNITS) capsule, Take 400 Units by mouth daily as needed (circulation)., Disp: , Rfl:  .  zolpidem (AMBIEN) 10 MG tablet, Take 10 mg by mouth at bedtime., Disp: , Rfl:   Objective: Patient sounds stable.  They are in no apparent distress.  Breathing is non labored.  Mood and behavior are normal.  Laboratory Data:  Recent Results (from the past 2160 hour(s))  SARS CORONAVIRUS 2 (TAT 6-24 HRS) Nasopharyngeal Nasopharyngeal Swab     Status: None   Collection Time: 06/28/20  8:48 AM   Specimen: Nasopharyngeal Swab  Result Value Ref Range   SARS Coronavirus 2 NEGATIVE NEGATIVE    Comment: (NOTE) SARS-CoV-2 target nucleic acids are NOT DETECTED.  The SARS-CoV-2 RNA is  generally detectable in upper and lower respiratory specimens during the acute phase of infection. Negative results do not preclude SARS-CoV-2 infection, do not rule out co-infections with other pathogens, and should not be used as the sole basis for treatment or other patient management decisions. Negative results must be combined with clinical observations, patient history, and epidemiological information. The expected result is Negative.  Fact Sheet for Patients: SugarRoll.be  Fact Sheet for Healthcare Providers: https://www.woods-mathews.com/  This test is not yet  approved or cleared by the [place] FDA and  has been authorized for detection and/or diagnosis of SARS-CoV-2 by FDA under an Emergency Use Authorization (EUA). This EUA will remain  in effect (meaning this test can be used) for the duration of the COVID-19 declaration under Se ction 564(b)(1) of the Act, 21 U.S.C. section 360bbb-3(b)(1), unless the authorization is terminated or revoked sooner.  Performed at [facility], [place] [address] [place], [place] [address]   Surgical pathology     Status: None   Collection Time: 06/29/20  8:44 AM  Result Value Ref Range   SURGICAL PATHOLOGY      SURGICAL PATHOLOGY CASE: [id number] PATIENT: Brent Mccoy Surgical Pathology Report     Clinical History: Screened for colon cancer, Hematochezia, morbid obesity (jmc)     FINAL MICROSCOPIC DIAGNOSIS:  A. COLON, ASCENDING, RECTAL, POLYPECTOMY: - Tubular adenoma(s) without high-grade dysplasia or malignancy - Hyperplastic polyp     GROSS DESCRIPTION:  Received in formalin are tan, soft tissue fragments that are submitted in toto. Number: Multiple size: Range from 0.3 to 1.1 cm in greatest dimension blocks: 1 Brent Mccoy 06/29/2020)    Final Diagnosis performed by Jaquita Folds, MD.   Electronically signed 06/30/2020 Technical and / or Professional components performed at [facility] [address], [place], [place] [address].  Immunohistochemistry Technical component (if applicable) was performed at [facility]. [address], [place], [place], [place] [address].   IMMUNOHISTOCHEMISTRY DISCLAIMER (if applicable) : Some of these immunohistochemical stains may have been developed and the performance characteristics determine by [facility]. Some may not have been cleared or approved by the U.S. Food and Drug Administration. The FDA has determined that such clearance or approval is not necessary. This test is used for  clinical purposes. It should not be regarded as investigational or for research. This laboratory is certified under the [place] (CLIA-88) as qualified to perform high complexity clinical laboratory testing.  The controls stained appropriately.      Assessment: 60 y.o. male with mild/moderate COVID 19 viral infection diagnosed on 08/07/20 at high risk for progression to severe COVID 19.  Plan:  This patient is a 60 y.o. male that meets the following criteria for Emergency Use Authorization of: Molnupiravir  1. Age >18 yr 2. SARS-COV-2 positive test 3. Symptom onset < 5 days 4. Mild-to-moderate COVID disease with high risk for severe progression to hospitalization or death   I have spoken and communicated the following to the patient or parent/caregiver regarding: 1. Molnupiravir is an unapproved drug that is authorized for use under an Print production planner.  2. There are no adequate, approved, available products for the treatment of COVID-19 in adults who have mild-to-moderate COVID-19 and are at high risk for progressing to severe COVID-19, including hospitalization or death. 3. Other therapeutics are currently authorized. For additional information on all products authorized for treatment or prevention of COVID-19, please see TanEmporium.pl.  4. There are benefits  and risks of taking this treatment as outlined in the "Fact Sheet for Patients and Caregivers."  5. "Fact Sheet for Patients and Caregivers" was reviewed with patient. A hard copy will be provided to patient from pharmacy prior to the patient receiving treatment. 6. Patients should continue to self-isolate and use infection control measures (e.g., wear mask, isolate, social distance, avoid sharing personal items, clean and disinfect "high touch" surfaces, and frequent  handwashing) according to CDC guidelines.  7. The patient or parent/caregiver has the option to accept or refuse treatment. 8. [place] has established a pregnancy surveillance program. 9. Females of childbearing potential should use a reliable method of contraception correctly and consistently, as applicable, for the duration of treatment and for 4 days after the last dose of Molnupiravir. 16. Males of reproductive potential who are sexually active with females of childbearing potential should use a reliable method of contraception correctly and consistently during treatment and for at least 3 months after the last dose. 11. Pregnancy status and risk was assessed. Patient verbalized understanding of precautions.   After reviewing above information with the patient, the patient agrees to receive molnupiravir.  Follow up instructions:    . Take prescription BID x 5 days as directed . Reach out to pharmacist for counseling on medication if desired . For concerns regarding further COVID symptoms please follow up with your PCP or urgent care . For urgent or life-threatening issues, seek care at your local emergency department  The patient was provided an opportunity to ask questions, and all were answered. The patient agreed with the plan and demonstrated an understanding of the instructions.     The patient was advised to call their PCP or seek an in-person evaluation if the symptoms worsen or if the condition fails to improve as anticipated.   I provided 15 minutes of non face-to-face telephone visit time during this encounter, and > 50% was spent counseling as documented under my assessment & plan.  Jacquelin Hawking, NP 08/08/2020 /2:10 PM  I connected by phone with Brent Mccoy on 08/08/2020 at 2:25 PM to discuss the potential use of a new treatment for mild to moderate COVID-19 viral infection in non-hospitalized patients.  This patient is a 60 y.o. male that meets the  FDA criteria for Emergency Use Authorization of COVID monoclonal antibody bebtelovimab.  Has a (+) direct SARS-CoV-2 viral test result  Has mild or moderate COVID-19   Is NOT hospitalized due to COVID-19  Is within 10 days of symptom onset  Has at least one of the high risk factor(s) for progression to severe COVID-19 and/or hospitalization as defined in EUA.  Specific high risk criteria : BMI > 25, Diabetes and Cardiovascular disease or hypertension   I have spoken and communicated the following to the patient or parent/caregiver regarding COVID monoclonal antibody treatment:  2. FDA has authorized the emergency use for the treatment of mild to moderate COVID-19 in adults and pediatric patients with positive results of direct SARS-CoV-2 viral testing who are 47 years of age and older weighing at least 40 kg, and who are at high risk for progressing to severe COVID-19 and/or hospitalization.  3. The significant known and potential risks and benefits of COVID monoclonal antibody, and the extent to which such potential risks and benefits are unknown.  4. Information on available alternative treatments and the risks and benefits of those alternatives, including clinical trials.  5. Patients treated with COVID monoclonal antibody should continue to  self-isolate and use infection control measures (e.g., wear mask, isolate, social distance, avoid sharing personal items, clean and disinfect "high touch" surfaces, and frequent handwashing) according to CDC guidelines.   6. The patient or parent/caregiver has the option to accept or refuse COVID monoclonal antibody treatment.  7. Discussion about the monoclonal antibody infusion does not ensure treatment. The patient will be placed on a list and scheduled according to risk, symptom onset and availability. A scheduler will reach to the patient to let them know if we can accommodate their infusion or not.  After reviewing this information with the  patient, the patient has agreed to receive one of the available covid 19 monoclonal antibodies and will be provided an appropriate fact sheet prior to infusion. Jacquelin Hawking, NP 08/08/2020 2:25 PM

## 2020-08-09 ENCOUNTER — Ambulatory Visit (INDEPENDENT_AMBULATORY_CARE_PROVIDER_SITE_OTHER): Payer: BC Managed Care – PPO

## 2020-08-09 ENCOUNTER — Other Ambulatory Visit: Payer: Self-pay

## 2020-08-09 VITALS — BP 127/84 | HR 93 | Temp 99.1°F

## 2020-08-09 DIAGNOSIS — U071 COVID-19: Secondary | ICD-10-CM

## 2020-08-09 MED ORDER — FAMOTIDINE IN NACL 20-0.9 MG/50ML-% IV SOLN: 20.0000 mg | Freq: Once | INTRAVENOUS | Status: AC | PRN

## 2020-08-09 MED ORDER — METHYLPREDNISOLONE SODIUM SUCC 125 MG IJ SOLR: 125.0000 mg | Freq: Once | INTRAMUSCULAR | Status: AC | PRN

## 2020-08-09 MED ORDER — DIPHENHYDRAMINE HCL 50 MG/ML IJ SOLN: 50.0000 mg | Freq: Once | INTRAMUSCULAR | Status: AC | PRN

## 2020-08-09 MED ORDER — ALBUTEROL SULFATE HFA 108 (90 BASE) MCG/ACT IN AERS: 2.0000 | INHALATION_SPRAY | Freq: Once | RESPIRATORY_TRACT | Status: AC | PRN

## 2020-08-09 MED ORDER — EPINEPHRINE 0.3 MG/0.3ML IJ SOAJ: 0.3000 mg | Freq: Once | INTRAMUSCULAR | Status: AC | PRN

## 2020-08-09 MED ORDER — BEBTELOVIMAB 175 MG/2 ML IV (EUA)
175.0000 mg | Freq: Once | INTRAMUSCULAR | Status: AC
  Administered 2020-08-09: 175 mg via INTRAVENOUS

## 2020-08-09 MED ORDER — SODIUM CHLORIDE 0.9 % IV SOLN: INTRAVENOUS | Status: AC | PRN

## 2020-08-09 NOTE — Progress Notes (Signed)
Diagnosis: Covid  Provider:  Marshell Garfinkel, MD  Procedure: Infusion  IV Type: Peripheral, IV Location: L Forearm  Bebtelovimab, Dose: 175mg   Infusion Start Time: 1400  Infusion Stop Time: 1500  Post Infusion IV Care: Observation period completed and Peripheral IV Discontinued  Discharge: Condition: Good, Destination: Home . AVS provided to patient.   Performed by:  Arnoldo Morale, RN

## 2020-08-09 NOTE — Progress Notes (Incomplete)
Diagnosis: Covid  Provider:  Marshell Garfinkel, MD  Procedure: Infusion  IV Type: Peripheral, IV Location: L Forearm  Bebtelovimab, Dose: 175 mg  Infusion Start Time: 1400  Infusion Stop Time: 1500  Post Infusion IV Care: {CHINF Post Infusion:25398}  Discharge: Condition: Good, Destination: Home . AVS provided to patient.   Performed by:  Koren Shiver, RN

## 2020-08-09 NOTE — Patient Instructions (Signed)

## 2020-08-10 ENCOUNTER — Other Ambulatory Visit (HOSPITAL_COMMUNITY): Payer: Self-pay

## 2021-03-28 ENCOUNTER — Encounter: Payer: Self-pay | Admitting: Internal Medicine

## 2021-03-28 NOTE — Progress Notes (Signed)
HPI M never smoker followed for Sarcoid, Insomnia, OSA/CPAP,  complicated by HBP, allergic rhinitis, GERD, LeftDVT 2019, PE 2020/ Eliquis, Obesity, CKD3a,  NPSG [facility] 05/04/14 AHI 61.2/ hr, desaturation to 81%, CPAP to 11, body weight 422 lbs ACE 04/23/16- 68 ( 9-67) CBC with differential 04/23/2016-WNL -------------------------------------------------------------------------------------------- 05/04/19-  60 year old M never smoker followed for Sarcoid, Insomnia, OSA/CPAP,  complicated by HBP, allergic rhinitis, GERD, LeftDVT 2019, PE 2020/ Eliquis, Obesity, CKD3a,  CPAP auto12-16 /[employer] Download compliance 100%, AHI 0.2/ hr Hosp FU- 11/28- 03/23/2019- submassive bilateral PE.  LLE DVT in 2019 after long car ride.  Rx'd Eliquis, transitioned to ASA.  Brother had PE. Now desk job/ sedentary. Back on Eliquis per PCP. Had SVT in hosp, seen by Cardiology. Body weight today 426 lbs Breathing feels fine. Asks what cough syrup ok if needed, w Eliquis. Not needed now, but if needed, he can take an otc cough syrup w/o ASA. He can check w pharmacist if uncertain. Now trying to walk more. Declines flu vax, intends to take covid vax after he sees how it affects others. CTa 11/228/2020 IMPRESSION: Positive for acute PE with CT evidence of right heart strain (RV/LV Ratio = 1.3) consistent with at least submassive (intermediate risk) PE. The presence of right heart strain has been associated with an increased risk of morbidity and mortality. Please activate Code PE by paging [phone].  03/29/21- 34 year old M never smoker followed for Sarcoid, Insomnia, OSA/CPAP,  Insomnia, complicated by PE 0981, HBP, allergic rhinitis, GERD, LeftDVT 2019, PE 2020/ Eliquis, Obesity, CKD3a, SVT, COVID infection May 2022, CPAP auto12-16 /[employer] Download compliance 100%, AHI 3.5/ hr Body weight today-456 lbs Covid vax-5 Phizer Flu vax-had He is concerned about possible sarcoid activity, noting increased  pigmentation and nodularity and some old skin lesions.  He is also concerned that memory may not be quite as sharp for names and faces, brain fog, daytime tiredness.  No evident adenopathy or significant change in his breathing. Wears CPAP comfortably with no concern. Insomnia with difficulty initiating sleep.  If he awakens at night because of arthralgias he may have trouble getting back to sleep.  Taking combination of Ambien and Benadryl he averages 5-1/2 to 6 hours of sleep a night.  We discussed potential especially for Benadryl to linger. COVID infection in May treated with infusion and pills.  ROV-see HPI    + = positive Constitutional:   No-   weight loss, night sweats, fevers, chills, +fatigue, lassitude. HEENT:   No-  headaches, difficulty swallowing, tooth/dental problems, sore throat,       No-  sneezing, itching, ear ache, nasal congestion, post nasal drip,  CV:  No-   chest pain, orthopnea, PND, swelling in lower extremities, anasarca, dizziness, palpitations Resp: + shortness of breath with exertion or at rest.              No-   productive cough,   non-productive cough,  No- coughing up of blood.              No-   change in color of mucus.  No- wheezing.   Skin: No-   rash or lesions. GI:  No-   heartburn, indigestion, abdominal pain, nausea, vomiting, diarrhea,                 change in bowel habits, loss of appetite GU: No-   dysuria, change in color of urine, no urgency or frequency.  No- flank pain. MS:  + joint pain or  swelling.  No- decreased range of motion.  No- back pain. Neuro-     nothing unusual Psych:  No- change in mood or affect. No depression or anxiety. + memory loss.  OBJ General- Alert, Oriented, Affect-appropriate, Distress- none acute, +morbidly obese/ big, laconic Skin- +rash-hyperpigmented area persists left lateral calf. , lesions- none, excoriation- none Lymphadenopathy- none Head- atraumatic            Eyes- Gross vision intact, PERRLA,  conjunctivae clear secretions            Ears- Hearing, canals-normal            Nose- Clear, no-Septal dev, mucus, polyps, erosion, perforation             Throat- Mallampati II-III , mucosa clear , drainage- none, tonsils- atrophic Neck- flexible , trachea midline, no stridor , thyroid nl, carotid no bruit Chest - symmetrical excursion , unlabored           Heart/CV- RRR , no murmur , no gallop  , no rub, nl s1 s2                           - JVD- none , edema- none, stasis changes- none, varices- none           Lung- clear to P&A, wheeze- none, cough- none , dullness-none, rub- none           Chest wall-  Abd-  Br/ Gen/ Rectal- Not done, not indicated Extrem- cyanosis- none, clubbing, none, atrophy- none, strength- nl, + cane Neuro- grossly intact to observation

## 2021-03-29 ENCOUNTER — Ambulatory Visit (INDEPENDENT_AMBULATORY_CARE_PROVIDER_SITE_OTHER): Payer: BC Managed Care – PPO | Admitting: Internal Medicine

## 2021-03-29 ENCOUNTER — Ambulatory Visit (INDEPENDENT_AMBULATORY_CARE_PROVIDER_SITE_OTHER): Payer: BC Managed Care – PPO

## 2021-03-29 ENCOUNTER — Other Ambulatory Visit: Payer: Self-pay

## 2021-03-29 ENCOUNTER — Encounter: Payer: Self-pay | Admitting: Internal Medicine

## 2021-03-29 VITALS — BP 120/80 | HR 84 | Temp 98.4°F | Ht 75.0 in | Wt >= 6400 oz

## 2021-03-29 DIAGNOSIS — G4733 Obstructive sleep apnea (adult) (pediatric): Secondary | ICD-10-CM

## 2021-03-29 DIAGNOSIS — D869 Sarcoidosis, unspecified: Secondary | ICD-10-CM

## 2021-03-29 LAB — COMPREHENSIVE METABOLIC PANEL
ALT: 18 U/L (ref 0–53)
AST: 19 U/L (ref 0–37)
Albumin: 3.9 g/dL (ref 3.5–5.2)
Alkaline Phosphatase: 52 U/L (ref 39–117)
BUN: 18 mg/dL (ref 6–23)
CO2: 32 mEq/L (ref 19–32)
Calcium: 9.4 mg/dL (ref 8.4–10.5)
Chloride: 101 mEq/L (ref 96–112)
Creatinine, Ser: 1.4 mg/dL (ref 0.40–1.50)
GFR: 54.04 mL/min — ABNORMAL LOW (ref >60.00)
Glucose, Bld: 98 mg/dL (ref 70–99)
Potassium: 4 mEq/L (ref 3.5–5.1)
Sodium: 138 mEq/L (ref 135–145)
Total Bilirubin: 0.7 mg/dL (ref 0.2–1.2)
Total Protein: 7.8 g/dL (ref 6.0–8.3)

## 2021-03-29 LAB — CBC WITH DIFFERENTIAL/PLATELET
Basophils Absolute: 0 10*3/uL (ref 0.0–0.1)
Basophils Relative: 0.6 % (ref 0.0–3.0)
Eosinophils Absolute: 0.1 10*3/uL (ref 0.0–0.7)
Eosinophils Relative: 3.2 % (ref 0.0–5.0)
HCT: 45.5 % (ref 39.0–52.0)
Hemoglobin: 14.5 g/dL (ref 13.0–17.0)
Lymphocytes Relative: 30.5 % (ref 12.0–46.0)
Lymphs Abs: 1.2 10*3/uL (ref 0.7–4.0)
MCHC: 31.9 g/dL (ref 30.0–36.0)
MCV: 90.2 fl (ref 78.0–100.0)
Monocytes Absolute: 0.5 10*3/uL (ref 0.1–1.0)
Monocytes Relative: 12.5 % — ABNORMAL HIGH (ref 3.0–12.0)
Neutro Abs: 2.1 10*3/uL (ref 1.4–7.7)
Neutrophils Relative %: 53.2 % (ref 43.0–77.0)
Platelets: 170 10*3/uL (ref 150.0–400.0)
RBC: 5.04 Mil/uL (ref 4.22–5.81)
RDW: 15 % (ref 11.5–15.5)
WBC: 4 10*3/uL (ref 4.0–10.5)

## 2021-03-29 MED ORDER — ESZOPICLONE 3 MG PO TABS: 3.0000 mg | ORAL_TABLET | Freq: Every day | ORAL | 1 refills | Status: DC

## 2021-03-29 NOTE — Assessment & Plan Note (Signed)
Benefits from CPAP with good compliance and control Plan-continue auto 12-16.  Overnight oximetry.

## 2021-03-29 NOTE — Assessment & Plan Note (Signed)
I suggested we try replacing Ambien/Benadryl with Lunesta to see if he recognizes any improvement in daytime alertness and memory

## 2021-03-29 NOTE — Assessment & Plan Note (Signed)
Every encouragement continues for diet and exercise.  He might benefit from referral to a program such as [facility]

## 2021-03-29 NOTE — Patient Instructions (Addendum)
Order- CXR    dx Sarcoid  Order- lab  Angiotensin converting enzyme level, CBC w diff, CMET,   Order- schedule overnight oximetry on CPAP  We can continue CPAP auto 12-16, mask of choice, humidifier, supplies, AirView/ card  Script sent to try Lunesta 3 mg    1 at bedtime for sleep. Try this instead of Ambien/ Benadryl. See if you sleep well enough and if the morning fatigue is any better.

## 2021-03-29 NOTE — Assessment & Plan Note (Signed)
I would not expect systemic sarcoid to flare at this age but we will check. Plan-labs, CXR

## 2021-03-30 LAB — ANGIOTENSIN CONVERTING ENZYME: Angiotensin-Converting Enzyme: 85 U/L — ABNORMAL HIGH (ref 9–67)

## 2021-04-03 ENCOUNTER — Other Ambulatory Visit: Payer: Self-pay | Admitting: Internal Medicine

## 2021-04-03 DIAGNOSIS — D869 Sarcoidosis, unspecified: Secondary | ICD-10-CM

## 2021-04-03 MED ORDER — PREDNISONE 10 MG PO TABS: ORAL_TABLET | ORAL | 0 refills | Status: DC

## 2021-04-03 NOTE — Progress Notes (Signed)
Pt notified of results

## 2021-04-04 ENCOUNTER — Telehealth: Payer: Self-pay | Admitting: Primary Care

## 2021-04-04 ENCOUNTER — Telehealth: Payer: Self-pay | Admitting: Internal Medicine

## 2021-04-04 DIAGNOSIS — G4733 Obstructive sleep apnea (adult) (pediatric): Secondary | ICD-10-CM

## 2021-04-04 NOTE — Telephone Encounter (Signed)
Error/disregard

## 2021-04-04 NOTE — Telephone Encounter (Signed)
Pt was seen 12/8 by CY- given rx for prednisone, got rx. Pt states he has questions about x ray and blood work. Pt wants to know what the x ray showed and why he needs bloodwork after prednisone to see if he's better instead of an x ray. Pt came in because he was experiencing SOB, fatigue,"pimples on skin" which could indicate sarcoidosis(what CY is treating him for). Pt wanting to know if prednisone will make this better or worse/alleviate those symptoms.  Is it possible to reverse the scarring on lungs? Please advise [phone]

## 2021-04-06 NOTE — Telephone Encounter (Signed)
Deneise Lever, MD  03/30/2021  7:06 PM EST Back to Top    Labs- ACE level is a little higher than before. This can indicate some sarcoid activity. Kidney function tessts are a little worsse that when last checked, and blood sugar runs a little high.  Best treatment for the sarcoid is prednisone, which I will order, but we need to recheck labs in 2 weeks.   Order- prednisone 10 mg, # 20, 4 X 2 DAYS, 3 X 2 DAYS, 2 X 2 DAYS, 1 X 2 DAYS Oder- future labs in 3 weeks- BMET, ACE level   dx sarcoid

## 2021-04-06 NOTE — Telephone Encounter (Signed)
Called and spoke with patient to let him know of recs from Dr. Annamaria Boots. Patient expressed understanding to Dr. Janee Morn reasoning. He states that he will come and get blood work the first week of January. He has been scheduled for a follow up with Dr. Annamaria Boots. Nothing further needed at this time.    Next Appt With Pulmonology Baird Lyons, MD) 05/08/2021 at 2:00 PM

## 2021-04-06 NOTE — Telephone Encounter (Signed)
The chest xray doesn't show obvious active sarcoid. Hopefully this is old scarring. I don't expect it to change much with a short prednisone taper.  I recommended a short course of prednisone because of the elevated ACE lab test, which is what we usually follow with sarcoid activity. I asked for a repeat ACE level in 3 weeks, to get some idea of how it responded to prednisone. I asked to repeat the BMET lab test so we could make sure that blood sugar had not been raised way out of control.   At last visit, I had aimed for a return appointment in April. Now that we know sarcoid is active, let's change that and bring him back sometime in January or early February.

## 2021-04-25 ENCOUNTER — Other Ambulatory Visit (INDEPENDENT_AMBULATORY_CARE_PROVIDER_SITE_OTHER): Payer: BC Managed Care – PPO

## 2021-04-25 DIAGNOSIS — D869 Sarcoidosis, unspecified: Secondary | ICD-10-CM | POA: Diagnosis not present

## 2021-04-25 LAB — BASIC METABOLIC PANEL
BUN: 18 mg/dL (ref 6–23)
CO2: 29 mEq/L (ref 19–32)
Calcium: 9.1 mg/dL (ref 8.4–10.5)
Chloride: 102 mEq/L (ref 96–112)
Creatinine, Ser: 1.29 mg/dL (ref 0.40–1.50)
GFR: 59.58 mL/min — ABNORMAL LOW (ref >60.00)
Glucose, Bld: 98 mg/dL (ref 70–99)
Potassium: 3.8 mEq/L (ref 3.5–5.1)
Sodium: 137 mEq/L (ref 135–145)

## 2021-04-27 LAB — ANGIOTENSIN CONVERTING ENZYME: Angiotensin-Converting Enzyme: 71 U/L — ABNORMAL HIGH (ref 9–67)

## 2021-05-07 NOTE — Progress Notes (Signed)
HPI M never smoker followed for Sarcoid, Insomnia, OSA/CPAP,  complicated by HBP, allergic rhinitis, GERD, LeftDVT 2019, PE 2020/ Eliquis, Obesity, CKD3a,  NPSG [place] 05/04/14 AHI 61.2/ hr, desaturation to 81%, CPAP to 11, body weight 422 lbs ACE 04/23/16- 68 ( 9-67) CBC with differential 04/23/2016-WNL --------------------------------------------------------------------------------------------   03/29/21- 61 year old M never smoker followed for Sarcoid, Insomnia, OSA/CPAP,  Insomnia, complicated by PE 8119, HBP, allergic rhinitis, GERD, LeftDVT 2019, PE 2020/ Eliquis, Obesity, CKD3a, SVT, COVID infection May 2022, CPAP auto12-16 /[employer] Download compliance 100%, AHI 3.5/ hr Body weight today-456 lbs Covid vax-5 Phizer Flu vax-had He is concerned about possible sarcoid activity, noting increased pigmentation and nodularity and some old skin lesions.  He is also concerned that memory may not be quite as sharp for names and faces, brain fog, daytime tiredness.  No evident adenopathy or significant change in his breathing. Wears CPAP comfortably with no concern. Insomnia with difficulty initiating sleep.  If he awakens at night because of arthralgias he may have trouble getting back to sleep.  Taking combination of Ambien and Benadryl he averages 5-1/2 to 6 hours of sleep a night.  We discussed potential especially for Benadryl to linger. COVID infection in May treated with infusion and pills.  05/08/21- 53 year old M never smoker followed for Sarcoid, Insomnia, OSA/CPAP,  complicated  by HBP, allergic rhinitis, GERD, LeftDVT 2019, PE 2020/ Eliquis, Obesity, CKD3a, SVT, COVID infection May 2022, -Prednisone 8 day taper from 40 mg 04/03/21,  CPAP auto12-16 /[employer] Download compliance - 100%, AHI 3.5/ hr Body weight today- Covid vax-5 Phizer Flu vax-had Sarcoid activity indicated by elevated ACE level in January. Took prednisone taper.  -----Patient states that he is still having  some shortness of breath that comes and goes and is not always with activity, brain fog, fatigue. Patient states that he feels like it is the same since last visit. He is not sure if his awareness of episodic dyspnea (mainly exertional) and "brain fog" might date back to COVID infection last spring.  We discussed possibility of long COVID.  He has not been anemic.  He remains very heavy and we understand this is a significant component of his dyspnea.  He is going to be going back to cardiology.  He denies any cough or wheeze, or chest pain. He did not recognize much change related to taking prednisone taper.  We discussed our shared preference not to stay on prednisone because of side effects and he is comfortable watching sarcoid status for now as long as there is no obvious progressive organ involvement. Lab- ACE level 71H on 04/25/21, [place] on 03/29/21, 68 H on 04/23/16 CXR 03/29/21-  IMPRESSION: Low lung volumes without evidence of acute cardiopulmonary disease    ROV-see HPI    + = positive Constitutional:   No-   weight loss, night sweats, fevers, chills, +fatigue, lassitude. HEENT:   No-  headaches, difficulty swallowing, tooth/dental problems, sore throat,       No-  sneezing, itching, ear ache, nasal congestion, post nasal drip,  CV:  No-   chest pain, orthopnea, PND, swelling in lower extremities, anasarca, dizziness, palpitations Resp: + shortness of breath with exertion or at rest.              No-   productive cough,   non-productive cough,  No- coughing up of blood.              No-   change in color of mucus.  No- wheezing.  Skin: No-   rash or lesions. GI:  No-   heartburn, indigestion, abdominal pain, nausea, vomiting, diarrhea,                 change in bowel habits, loss of appetite GU: No-   dysuria, change in color of urine, no urgency or frequency.  No- flank pain. MS:  + joint pain or swelling.  No- decreased range of motion.  No- back pain. Neuro-     nothing unusual Psych:   No- change in mood or affect. No depression or anxiety. + memory loss.  OBJ General- Alert, Oriented, Affect-appropriate, Distress- none acute, +morbidly obese/ big, laconic Skin- +rash-hyperpigmented area persists left lateral calf. , lesions- none, excoriation- none Lymphadenopathy- none Head- atraumatic            Eyes- Gross vision intact, PERRLA, conjunctivae clear secretions            Ears- Hearing, canals-normal            Nose- Clear, no-Septal dev, mucus, polyps, erosion, perforation             Throat- Mallampati II-III , mucosa clear , drainage- none, tonsils- atrophic Neck- flexible , trachea midline, no stridor , thyroid nl, carotid no bruit Chest - symmetrical excursion , unlabored           Heart/CV- RRR , no murmur , no gallop  , no rub, nl s1 s2                           - JVD- none , edema- none, stasis changes- none, varices- none           Lung- clear to P&A, wheeze- none, cough- none , dullness-none, rub- none           Chest wall-  Abd-  Br/ Gen/ Rectal- Not done, not indicated Extrem- cyanosis- none, clubbing, none, atrophy- none, strength- nl, + cane Neuro- grossly intact to observation

## 2021-05-08 ENCOUNTER — Encounter: Payer: Self-pay | Admitting: Internal Medicine

## 2021-05-08 ENCOUNTER — Ambulatory Visit (INDEPENDENT_AMBULATORY_CARE_PROVIDER_SITE_OTHER): Payer: BC Managed Care – PPO | Admitting: Internal Medicine

## 2021-05-08 ENCOUNTER — Other Ambulatory Visit: Payer: Self-pay

## 2021-05-08 VITALS — BP 136/84 | HR 92 | Temp 98.3°F | Ht 75.0 in | Wt >= 6400 oz

## 2021-05-08 DIAGNOSIS — D869 Sarcoidosis, unspecified: Secondary | ICD-10-CM | POA: Diagnosis not present

## 2021-05-08 DIAGNOSIS — I2699 Other pulmonary embolism without acute cor pulmonale: Secondary | ICD-10-CM | POA: Diagnosis not present

## 2021-05-08 DIAGNOSIS — G4733 Obstructive sleep apnea (adult) (pediatric): Secondary | ICD-10-CM

## 2021-05-08 DIAGNOSIS — F5101 Primary insomnia: Secondary | ICD-10-CM | POA: Diagnosis not present

## 2021-05-08 NOTE — Assessment & Plan Note (Signed)
He continues to benefit with good compliance and control Plan-continue auto 12-16

## 2021-05-08 NOTE — Assessment & Plan Note (Signed)
He remains on Eliquis without apparent recurrence.

## 2021-05-08 NOTE — Assessment & Plan Note (Signed)
ACE level peaked in December and is down to 71 after a prednisone taper.  We will recheck in a few months.

## 2021-05-08 NOTE — Patient Instructions (Addendum)
Order- schedule PFT. If unable to do full PFT, try for Spirometry pre and post     Dx Sarcoid  Order future ACE level in 3 months, before next ov.  Order- [employer]- please replace old mask of choice, filters, hoses, supplies

## 2021-05-08 NOTE — Assessment & Plan Note (Signed)
Ambien has worked pretty well for him.  Sleep hygiene reviewed. Mostly it is arthritic pains in back and hips that wakes him.

## 2021-05-08 NOTE — Addendum Note (Signed)
Addended by: Elby Beck R on: 05/08/2021 04:29 PM   Modules accepted: Orders

## 2021-06-01 ENCOUNTER — Telehealth: Payer: Self-pay | Admitting: Internal Medicine

## 2021-06-01 DIAGNOSIS — G4733 Obstructive sleep apnea (adult) (pediatric): Secondary | ICD-10-CM

## 2021-06-01 NOTE — Telephone Encounter (Signed)
Attempted to call pt but unable to reach. Left message for him to return call. °

## 2021-06-06 DIAGNOSIS — Z0289 Encounter for other administrative examinations: Secondary | ICD-10-CM

## 2021-06-06 NOTE — Progress Notes (Signed)
Cardiology Office Note:   Date:  06/07/2021  NAME:  Brent Mccoy    MRN: [id number] DOB:  December 23, 1960   PCP:  Harlan Stains, MD  Cardiologist:  None  Electrophysiologist:  None   Referring MD: Harlan Stains, MD   Chief Complaint  Patient presents with   Shortness of Breath   History of Present Illness:   Brent Mccoy is a 61 y.o. male with a hx of obesity, PE, OSA, SVT who presents for follow-up.  He reports he has noted shortness of breath for the last 8 months.  Symptoms occur with bending forward while sitting.  They do not occur with activity.  He reports he can lift heavy objects and carry a case of water into his house without any limitations.  He reports no increased edema.  No cough or congestion.  His weight is still up.  BMI 57.  Weight 456 pounds.  He has no JVD.  Blood pressure is elevated 155/96.  He reports he does not check this at home.  He does have sarcoidosis but tells me everything is stable.  He does not smoke.  No alcohol or drug use is reported.  Really nothing is changed.  I reviewed his CT scan from 2021.  There is no coronary calcium.  Echocardiogram in the past was normal.  No rapid heartbeat sensation.  He has had no recurrence of arrhythmia since 2020.  Chest x-ray from last month shows no evidence of acute pathology.  CT PE 08/22/2019 -> 0 coronary calcium  Problem List Sarcoidosis Morbid obesity  -BMI 57  3. OSA 4. Submassive PE -03/20/2019 5. SVT -in setting of submassive PE -resolved with adenosine x 1  Past Medical History: Past Medical History:  Diagnosis Date   Allergic rhinitis    Arrhythmia    DJD (degenerative joint disease)    GERD (gastroesophageal reflux disease)    hx of - not current   Hx of blood clots    legs and lungs   Hypertension    takes Losartan and Amlodipine daily   Insomnia    takes Ambien nightly as needed   Obesity    Sarcoidosis    Sleep apnea     Past Surgical History: Past Surgical History:   Procedure Laterality Date   COLONOSCOPY     COLONOSCOPY WITH PROPOFOL N/A 06/29/2020   Procedure: COLONOSCOPY WITH PROPOFOL;  Surgeon: Milus Banister, MD;  Location: [facility];  Service: Endoscopy;  Laterality: N/A;   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    diphenhydrAMINE (BENADRYL) 25 mg capsule Take 25 mg by mouth at bedtime.   docusate sodium (COLACE) 100 MG capsule Take 100 mg by mouth daily.   ELIQUIS 5 MG TABS tablet Take 5 mg by mouth 2 (two) times daily.   fluticasone (FLONASE) 50 MCG/ACT nasal spray Place 1 spray into both nostrils daily as needed for allergies or rhinitis.   loratadine (CLARITIN) 10 MG tablet Take 10 mg by mouth daily as needed for  allergies.   Multiple Vitamins-Minerals (MULTIVITAMIN WITH MINERALS) tablet Take 1 tablet by mouth 3 (three) times a week.   NON FORMULARY cpap   triamcinolone (KENALOG) 0.1 % Apply 1 application topically 2 (two) times daily as needed (irritation).   valsartan-hydrochlorothiazide (DIOVAN-HCT) 320-12.5 MG tablet Take 1 tablet by mouth daily.   zolpidem (AMBIEN) 10 MG tablet Take 10 mg by mouth at bedtime.   Current Facility-Administered Medications for the 06/07/21 encounter (Office Visit) with Geralynn Rile, MD  Medication   0.9 %  sodium chloride infusion     Allergies:    Patient has no allergy information on record.   Social History: Social History   Socioeconomic History   Marital status: Married    Spouse name: Not on file   Number of children: 1   Years of  education: Not on file   Highest education level: Not on file  Occupational History   Occupation: non-profit work  Tobacco Use   Smoking status: Never   Smokeless tobacco: Never  Vaping Use   Vaping Use: Never used  Substance and Sexual Activity   Alcohol use: Yes    Comment: occasionally   Drug use: No   Sexual activity: Not on file  Other Topics Concern   Not on file  Social History Narrative   Not on file   Social Determinants of Health   Financial Resource Strain: Not on file  Food Insecurity: Not on file  Transportation Needs: Not on file  Physical Activity: Not on file  Stress: Not on file  Social Connections: Not on file     Family History: The patient's family history includes Breast cancer in his sister; Other in his brother and brother; Rectal cancer in his father.  ROS:   All other ROS reviewed and negative. Pertinent positives noted in the HPI.     EKGs/Labs/Other Studies Reviewed:   The following studies were personally reviewed by me today:  TTE 03/21/2019  1. Left ventricular ejection fraction, by visual estimation, is 60 to  65%. The left ventricle has normal function. Left ventricular septal wall  thickness was moderately increased. Moderately increased left ventricular  posterior wall thickness. There is  moderately increased left ventricular hypertrophy.   2. Definity contrast agent was given IV to delineate the left ventricular  endocardial borders.   3. Left ventricular diastolic parameters are consistent with Grade I  diastolic dysfunction (impaired relaxation).   4. Global right ventricle has low normal systolic function.The right  ventricular size is normal. No increase in right ventricular wall  thickness.   5. Left atrial size was normal.   6. Right atrial size was normal.   7. The mitral valve is normal in structure. Trace mitral valve  regurgitation. No evidence of mitral stenosis.   8. The tricuspid valve is normal in structure.  Tricuspid valve  regurgitation is not demonstrated.   9. The aortic valve is tricuspid. Aortic valve regurgitation is not  visualized. No evidence of aortic valve sclerosis or stenosis.  10. The pulmonic valve was normal in structure. Pulmonic valve  regurgitation is not visualized.  11. TR signal is inadequate for assessing pulmonary artery systolic  pressure.  12. The inferior vena cava is dilated in size with <50% respiratory  variability, suggesting right atrial pressure of 15 mmHg.   Recent Labs: 03/29/2021: ALT 18; Hemoglobin 14.5; Platelets 170.0 04/25/2021: BUN 18; Creatinine, Ser 1.29; Potassium 3.8; Sodium 137   Recent Lipid Panel No results found for: CHOL,  TRIG, HDL, CHOLHDL, VLDL, LDLCALC, LDLDIRECT  Physical Exam:   VS:  BP (!) 155/96    Pulse 100    Ht 6\' 3"  (1.740 m)    Wt (!) 456 lb 3.2 oz (206.9 kg)    SpO2 97%    BMI 57.02 kg/m    Wt Readings from Last 3 Encounters:  06/07/21 (!) 456 lb 3.2 oz (206.9 kg)  05/08/21 (!) 456 lb 3.2 oz (206.9 kg)  03/29/21 (!) 456 lb (206.8 kg)    General: Well nourished, well developed, in no acute distress Head: Atraumatic, normal size  Eyes: PEERLA, EOMI  Neck: Supple, no JVD Endocrine: No thryomegaly Cardiac: Normal S1, S2; RRR; no murmurs, rubs, or gallops Lungs: Clear to auscultation bilaterally, no wheezing, rhonchi or rales  Abd: Soft, nontender, no hepatomegaly  Ext: No edema, pulses 2+ Musculoskeletal: No deformities, BUE and BLE strength normal and equal Skin: Warm and dry, no rashes   Neuro: Alert and oriented to person, place, time, and situation, CNII-XII grossly intact, no focal deficits  Psych: Normal mood and affect   ASSESSMENT:   IRAM LUNDBERG is a 61 y.o. male who presents for the following: 1. SOB (shortness of breath) on exertion   2. SVT (supraventricular tachycardia) (HCC)   3. Obesity, morbid, BMI 50 or higher (HCC)     PLAN:   1. SOB (shortness of breath) on exertion -8 months of shortness of  breath with leaning forward.  Symptoms also occur with sitting.  No evidence of volume overload on exam today.  Recent chest x-ray is clear.  Exam really is unremarkable.  He can do activity and heavy exertion.  Symptoms of shortness of breath occur at rest.  We will check a TSH and BNP.  I would like to repeat an echocardiogram.  He is high risk for heart failure with preserved ejection fraction.  Weight loss would be recommended regardless.  No need for Lasix at this time.  His blood pressure is also elevated.  I want him to start checking this.  We will continue current medications.  Unclear what exactly going on.  Recent CT PE study was negative.  Overall I suspect obesity is a big driver here.  No need for stress test.  He may be a good candidate for cardiac PET scan.  This should be available in April.  2. SVT (supraventricular tachycardia) ([place]) -Diagnosed in the setting of submassive pulmonary embolism in 2020.  No recurrence.  3. Obesity, morbid, BMI 50 or higher (HCC) -Weight 456 pounds.  Clearly contributing to his shortness of breath.  Diet exercise have been recommended.  Disposition: Return if symptoms worsen or fail to improve.  Medication Adjustments/Labs and Tests Ordered: Current medicines are reviewed at length with the patient today.  Concerns regarding medicines are outlined above.  Orders Placed This Encounter  Procedures   TSH   Brain natriuretic peptide   ECHOCARDIOGRAM COMPLETE   No orders of the defined types were placed in this encounter.   Patient Instructions  Medication Instructions:  The current medical regimen is effective;  continue present plan and medications.  *If you need a refill on your cardiac medications before your next appointment, please call your pharmacy*   Lab Work: TSH, BNP today   If you have labs (blood work) drawn today and your tests are completely normal, you will receive your results only by: [place] (if you have MyChart)  OR A paper copy in the mail If you have  any lab test that is abnormal or we need to change your treatment, we will call you to review the results.   Testing/Procedures:  Echocardiogram - Your physician has requested that you have an echocardiogram. Echocardiography is a painless test that uses sound waves to create images of your heart. It provides your doctor with information about the size and shape of your heart and how well your hearts chambers and valves are working. This procedure takes approximately one hour. There are no restrictions for this procedure. This will be performed at either our [facility] location - [address], [place] location [facility] 2nd floor.    Follow-Up: At [facility], you and your health needs are our priority.  As part of our continuing mission to provide you with exceptional heart care, we have created designated Provider Care Teams.  These Care Teams include your primary Cardiologist (physician) and Advanced Practice Providers (APPs -  Physician Assistants and Nurse Practitioners) who all work together to provide you with the care you need, when you need it.  We recommend signing up for the patient portal called "MyChart".  Sign up information is provided on this After Visit Summary.  MyChart is used to connect with patients for Virtual Visits (Telemedicine).  Patients are able to view lab/test results, encounter notes, upcoming appointments, etc.  Non-urgent messages can be sent to your provider as well.   To learn more about what you can do with MyChart, go to [email or web address].    Your next appointment:   As needed  The format for your next appointment:   In Person  Provider:   Eleonore Chiquito, MD    Other Instructions Check BP once daily     Time Spent with Patient: I have spent a total of 35 minutes with patient reviewing hospital notes, telemetry, EKGs, labs and examining the patient as well as  establishing an assessment and plan that was discussed with the patient.  > 50% of time was spent in direct patient care.  Signed, Addison Naegeli. Audie Box, MD, [place]  [address] [place], [place] [address] [phone]  06/07/2021 2:54 PM

## 2021-06-07 ENCOUNTER — Ambulatory Visit (INDEPENDENT_AMBULATORY_CARE_PROVIDER_SITE_OTHER): Payer: BC Managed Care – PPO | Admitting: Cardiovascular Disease

## 2021-06-07 ENCOUNTER — Encounter: Payer: Self-pay | Admitting: Cardiovascular Disease

## 2021-06-07 ENCOUNTER — Other Ambulatory Visit: Payer: Self-pay

## 2021-06-07 VITALS — BP 155/96 | HR 100 | Ht 75.0 in | Wt >= 6400 oz

## 2021-06-07 DIAGNOSIS — I471 Supraventricular tachycardia: Secondary | ICD-10-CM

## 2021-06-07 DIAGNOSIS — R0602 Shortness of breath: Secondary | ICD-10-CM

## 2021-06-07 NOTE — Patient Instructions (Addendum)
Medication Instructions:  The current medical regimen is effective;  continue present plan and medications.  *If you need a refill on your cardiac medications before your next appointment, please call your pharmacy*   Lab Work: TSH, BNP today   If you have labs (blood work) drawn today and your tests are completely normal, you will receive your results only by: [place] (if you have MyChart) OR A paper copy in the mail If you have any lab test that is abnormal or we need to change your treatment, we will call you to review the results.   Testing/Procedures:  Echocardiogram - Your physician has requested that you have an echocardiogram. Echocardiography is a painless test that uses sound waves to create images of your heart. It provides your doctor with information about the size and shape of your heart and how well your hearts chambers and valves are working. This procedure takes approximately one hour. There are no restrictions for this procedure. This will be performed at either our [facility] location - [address] [place] location [facility] 2nd floor.    Follow-Up: At [facility], you and your health needs are our priority.  As part of our continuing mission to provide you with exceptional heart care, we have created designated Provider Care Teams.  These Care Teams include your primary Cardiologist (physician) and Advanced Practice Providers (APPs -  Physician Assistants and Nurse Practitioners) who all work together to provide you with the care you need, when you need it.  We recommend signing up for the patient portal called "MyChart".  Sign up information is provided on this After Visit Summary.  MyChart is used to connect with patients for Virtual Visits (Telemedicine).  Patients are able to view lab/test results, encounter notes, upcoming appointments, etc.  Non-urgent messages can be sent to your provider as well.   To learn more  about what you can do with MyChart, go to [email or web address].    Your next appointment:   As needed  The format for your next appointment:   In Person  Provider:   Eleonore Chiquito, MD    Other Instructions Check BP once daily

## 2021-06-08 LAB — BRAIN NATRIURETIC PEPTIDE: BNP: <2.5 pg/mL (ref 0.0–100.0)

## 2021-06-08 LAB — TSH: TSH: 1.81 u[IU]/mL (ref 0.450–4.500)

## 2021-06-08 NOTE — Telephone Encounter (Signed)
His overnight oximetry showed oxygen level unsafe, at or below 88% for over 3 hours. He qualifies and we should order DME, new home O2 for sleep 2 Liters   dx nocturnal hypoxemia

## 2021-06-08 NOTE — Telephone Encounter (Signed)
Called and spoke with pt about the ONO. Asked pt when he had it done and he said that it was done about 2-3 weeks ago. Stated to pt that we would check with CY and his nurse to see if they ever received the ONO and once we heard from them we would then let him know what we found out and he verbalized understanding.  Routing to both CY and [place].

## 2021-06-08 NOTE — Telephone Encounter (Signed)
Called and spoke with patient to let him know ONO results. He expressed understanding. He states that he thinks he needs a new mask as well. Order has been placed for O2 and mask fitting. Nothing further needed at this time.

## 2021-06-11 ENCOUNTER — Telehealth: Payer: Self-pay | Admitting: Internal Medicine

## 2021-06-11 NOTE — Telephone Encounter (Signed)
I called the patient and he is agreeable to the Oxygen and the CPAP order. Andee Poles is going to let brad know to start on the CPAP order and oxygen at night time. Patient is aware he will get a call from [employer] to set up his machine and all supplies.

## 2021-06-11 NOTE — Telephone Encounter (Signed)
Please advise if ok to wait on oxygen until he gets the CPAP?

## 2021-06-11 NOTE — Telephone Encounter (Signed)
Do we know how long he has to wait for new CPAP?

## 2021-06-13 ENCOUNTER — Telehealth (INDEPENDENT_AMBULATORY_CARE_PROVIDER_SITE_OTHER): Payer: Self-pay

## 2021-06-13 ENCOUNTER — Ambulatory Visit (INDEPENDENT_AMBULATORY_CARE_PROVIDER_SITE_OTHER): Payer: BC Managed Care – PPO | Admitting: Bariatrics

## 2021-06-13 ENCOUNTER — Other Ambulatory Visit: Payer: Self-pay

## 2021-06-13 ENCOUNTER — Encounter (INDEPENDENT_AMBULATORY_CARE_PROVIDER_SITE_OTHER): Payer: Self-pay | Admitting: Bariatrics

## 2021-06-13 VITALS — BP 106/67 | HR 102 | Temp 98.0°F | Ht 75.0 in | Wt >= 6400 oz

## 2021-06-13 DIAGNOSIS — Z6841 Body Mass Index (BMI) 40.0 and over, adult: Secondary | ICD-10-CM

## 2021-06-13 DIAGNOSIS — G4733 Obstructive sleep apnea (adult) (pediatric): Secondary | ICD-10-CM

## 2021-06-13 DIAGNOSIS — I10 Essential (primary) hypertension: Secondary | ICD-10-CM | POA: Diagnosis not present

## 2021-06-13 DIAGNOSIS — Z1331 Encounter for screening for depression: Secondary | ICD-10-CM

## 2021-06-13 DIAGNOSIS — R7309 Other abnormal glucose: Secondary | ICD-10-CM | POA: Diagnosis not present

## 2021-06-13 DIAGNOSIS — E7800 Pure hypercholesterolemia, unspecified: Secondary | ICD-10-CM

## 2021-06-13 DIAGNOSIS — E669 Obesity, unspecified: Secondary | ICD-10-CM

## 2021-06-13 DIAGNOSIS — E559 Vitamin D deficiency, unspecified: Secondary | ICD-10-CM

## 2021-06-13 DIAGNOSIS — R0602 Shortness of breath: Secondary | ICD-10-CM | POA: Diagnosis not present

## 2021-06-13 DIAGNOSIS — R5383 Other fatigue: Secondary | ICD-10-CM | POA: Diagnosis not present

## 2021-06-13 DIAGNOSIS — M1612 Unilateral primary osteoarthritis, left hip: Secondary | ICD-10-CM

## 2021-06-13 NOTE — Progress Notes (Signed)
Chief Complaint:   Brent Mccoy (MR# [id number]) is a 61 y.o. male who presents for evaluation and treatment of obesity and related comorbidities. Current BMI is Body mass index is 55.75 kg/m. Brent Mccoy has been struggling with his weight for many years and has been unsuccessful in either losing weight, maintaining weight loss, or reaching his healthy weight goal.  Brent Mccoy states that he likes to cook, but notes no kitchen and home repair as an obstacle.  Brent Mccoy is currently in the action stage of change and ready to dedicate time achieving and maintaining a healthier weight. Brent Mccoy is interested in becoming our patient and working on intensive lifestyle modifications including (but not limited to) diet and exercise for weight loss.  Brent Mccoy's habits were reviewed today and are as follows: His family eats meals together, he thinks his family will eat healthier with him, his desired weight loss is 171 lbs, he has been heavy most of his life, he started gaining weight at 61 years old, his heaviest weight ever was 471 pounds, he has significant food cravings issues, he snacks frequently in the evenings, he skips meals frequently, he is frequently drinking liquids with calories, he frequently makes poor food choices, he frequently eats larger portions than normal, and he struggles with emotional eating.  Depression Screen Brent Mccoy's Food and Mood (modified PHQ-9) score was 16.  Depression screen [facility] 2/9 06/13/2021  Decreased Interest 3  Down, Depressed, Hopeless 0  PHQ - 2 Score 3  Altered sleeping 3  Tired, decreased energy 3  Change in appetite 1  Feeling bad or failure about yourself  1  Trouble concentrating 2  Moving slowly or fidgety/restless 3  Suicidal thoughts 0  PHQ-9 Score 16  Difficult doing work/chores Somewhat difficult   Subjective:   1. Other fatigue Brent Mccoy admits to daytime somnolence and admits to waking up still tired. Patient has a history of  symptoms of daytime fatigue and morning fatigue. Brent Mccoy generally gets 4 1/2 or 6 hours of sleep per night, and states that he has nightime awakenings. Snoring is present. Apneic episodes are present. Epworth Sleepiness Score is 2.   2. Shortness of breath on exertion Brent Mccoy notes increasing shortness of breath with exercising and seems to be worsening over time with weight gain. He notes getting out of breath sooner with activity than he used to. This has not gotten worse recently. Brent Mccoy denies shortness of breath at rest or orthopnea.  3. Obstructive sleep apnea Brent Mccoy uses his CPAP nightly.   4. Essential hypertension Brent Mccoy is taking amlodipine and valsartan. His blood pressure is currently controlled.  5. Primary osteoarthritis of left hip Brent Mccoy is taking Tramadol.  6. Elevated glucose Brent Mccoy is not on medications currently.  7. Vitamin D deficiency Brent Mccoy is not on Vit D supplementation.  8. Elevated cholesterol Brent Mccoy is currently not on medications.  Assessment/Plan:   1. Other fatigue Brent Mccoy does feel that his weight is causing his energy to be lower than it should be. Fatigue may be related to obesity, depression or many other causes. Labs will be ordered, and in the meanwhile, Brent Mccoy will focus on self care including making healthy food choices, increasing physical activity and focusing on stress reduction.  - EKG 12-Lead  2. Shortness of breath on exertion Brent Mccoy does feel that he gets out of breath more easily that he used to when he exercises. Brent Mccoy's shortness of breath appears to be obesity related and exercise induced. He has agreed to  work on weight loss and gradually increase exercise to treat his exercise induced shortness of breath. Will continue to monitor closely.  3. Obstructive sleep apnea Intensive lifestyle modifications are the first line treatment for this issue. We discussed several lifestyle modifications today. Brent Mccoy will continue to  use his CPAP nightly, and Pulmonary will add oxygen. He will continue to work on diet, exercise and weight loss efforts. We will continue to monitor. Orders and follow up as documented in patient record.   4. Essential hypertension Brent Mccoy will continue his medications, and will continue working on healthy weight loss and exercise to improve blood pressure control. We will watch for signs of hypotension as he continues his lifestyle modifications.  5. Primary osteoarthritis of left hip Brent Mccoy will continue his medications, and will be as active as tolerated but no pounding exercises. Orders and follow up as documented in patient record.  Counseling Osteoporosis happens when your bones get thin and weak. This can cause your bones to break (fracture) more easily.  Exercise is very important to keep bones strong. Focus on strength training (lifting weights) and exercises that make your muscles work to hold your body weight up (weight-bearing exercises). These include tai chi, yoga, and walking.  Limit alcohol intake to no more than 1 drink a day for nonpregnant women and 2 drinks a day for men. One drink equals 12 oz of beer, 5 oz of wine, or 1 oz of hard liquor. Do not use any products that have nicotine or tobacco in them.  Preventing falls Use tools to help you move around (mobility aids) as needed. These include canes, walkers, scooters, and crutches. Keep rooms well-lit and free of clutter. Wear shoes that fit you well and support your feet. Eat plenty of calcium and Vitamin D as these nutrients are good for your bones.   6. Elevated glucose We will check labs today, and will follow up in 2 weeks at [place] next visit.  - Insulin, random - Hemoglobin A1c  7. Vitamin D deficiency Low Vitamin D level contributes to fatigue and are associated with obesity, breast, and colon cancer. We will check labs today. Brent Mccoy will follow-up for routine testing of Vitamin D, at least 2-3 times per  year to avoid over-replacement.  - VITAMIN D 25 Hydroxy (Vit-D Deficiency, Fractures)  8. Elevated cholesterol Cardiovascular risk and specific lipid/LDL goals reviewed. We discussed several lifestyle modifications today. We will check labs today. [place] will continue to work on diet, exercise and weight loss efforts. Orders and follow up as documented in patient record.   Counseling Intensive lifestyle modifications are the first line treatment for this issue. Dietary changes: Increase soluble fiber. Decrease simple carbohydrates. Exercise changes: Moderate to vigorous-intensity aerobic activity 150 minutes per week if tolerated. Lipid-lowering medications: see documented in medical record.  - Lipid Panel With LDL/HDL Ratio  9. Screening for depression Brent Mccoy had a positive depression screening. Depression is commonly associated with obesity and often results in emotional eating behaviors. We will monitor this closely and work on CBT to help improve the non-hunger eating patterns. Referral to Psychology may be required if no improvement is seen as he continues in our clinic.  10. Obesity, current BMI of 55.7 Brent Mccoy is currently in the action stage of change and his goal is to continue with weight loss efforts. I recommend Brent Mccoy begin the structured treatment plan as follows:  He has agreed to the Category 4 Plan.  Meal planning. Reviewed labs from 04/25/2021 BMP, GFR,  glucose, 06/07/2021 TSH. No skipping meals.  Exercise goals: Will use his pedal bike at home.   Behavioral modification strategies: increasing lean protein intake, decreasing simple carbohydrates, increasing vegetables, increasing water intake, and planning for success.  He was informed of the importance of frequent follow-up visits to maximize his success with intensive lifestyle modifications for his multiple health conditions. He was informed we would discuss his lab results at his next visit unless there is a  critical issue that needs to be addressed sooner. Brent Mccoy agreed to keep his next visit at the agreed upon time to discuss these results.  Objective:   Blood pressure 106/67, pulse (!) 102, temperature 98 F (36.7 C), height $RemoveBe'6\' 3"'kRWtMcVgh$  (1.905 m), weight (!) 446 lb (202.3 kg), SpO2 98 %. Body mass index is 55.75 kg/m.  EKG: Normal sinus rhythm, rate 98 BPM.  Indirect Calorimeter completed today shows a VO2 of 432 and a REE of 2981.  His calculated basal metabolic rate is 8177 thus his basal metabolic rate is worse than expected.  Patient uses a walking cane. General: Cooperative, alert, well developed, in no acute distress. HEENT: Conjunctivae and lids unremarkable. Cardiovascular: Regular rhythm.  Lungs: Normal work of breathing. Neurologic: No focal deficits.   Lab Results  Component Value Date   CREATININE 1.29 04/25/2021   BUN 18 04/25/2021   NA 137 04/25/2021   K 3.8 04/25/2021   CL 102 04/25/2021   CO2 29 04/25/2021   Lab Results  Component Value Date   ALT 18 03/29/2021   AST 19 03/29/2021   ALKPHOS 52 03/29/2021   BILITOT 0.7 03/29/2021   No results found for: HGBA1C No results found for: INSULIN Lab Results  Component Value Date   TSH 1.810 06/07/2021   No results found for: CHOL, HDL, LDLCALC, LDLDIRECT, TRIG, CHOLHDL Lab Results  Component Value Date   WBC 4.0 03/29/2021   HGB 14.5 03/29/2021   HCT 45.5 03/29/2021   MCV 90.2 03/29/2021   PLT 170.0 03/29/2021   No results found for: IRON, TIBC, FERRITIN  Attestation Statements:   Reviewed by clinician on day of visit: allergies, medications, problem list, medical history, surgical history, family history, social history, and previous encounter notes.   Wilhemena Durie, am acting as Location manager for [employer], DO.  I have reviewed the above documentation for accuracy and completeness, and I agree with the above. Jearld Lesch, DO

## 2021-06-13 NOTE — Telephone Encounter (Signed)
Dr.Brown 

## 2021-06-13 NOTE — Telephone Encounter (Signed)
Patient would like to know what the EKG results were.  He states he doesn't know how to use My Chart.  Thank you

## 2021-06-14 ENCOUNTER — Encounter (INDEPENDENT_AMBULATORY_CARE_PROVIDER_SITE_OTHER): Payer: Self-pay | Admitting: Bariatrics

## 2021-06-14 DIAGNOSIS — R7303 Prediabetes: Secondary | ICD-10-CM | POA: Insufficient documentation

## 2021-06-14 DIAGNOSIS — E559 Vitamin D deficiency, unspecified: Secondary | ICD-10-CM | POA: Insufficient documentation

## 2021-06-14 DIAGNOSIS — E786 Lipoprotein deficiency: Secondary | ICD-10-CM | POA: Insufficient documentation

## 2021-06-14 LAB — LIPID PANEL WITH LDL/HDL RATIO
Cholesterol, Total: 169 mg/dL (ref 100–199)
HDL: 37 mg/dL — ABNORMAL LOW (ref >39)
LDL Chol Calc (NIH): 116 mg/dL — ABNORMAL HIGH (ref 0–99)
LDL/HDL Ratio: 3.1 ratio (ref 0.0–3.6)
Triglycerides: 83 mg/dL (ref 0–149)
VLDL Cholesterol Cal: 16 mg/dL (ref 5–40)

## 2021-06-14 LAB — VITAMIN D 25 HYDROXY (VIT D DEFICIENCY, FRACTURES): Vit D, 25-Hydroxy: 10.8 ng/mL — ABNORMAL LOW (ref 30.0–100.0)

## 2021-06-14 LAB — INSULIN, RANDOM: INSULIN: 21.9 u[IU]/mL (ref 2.6–24.9)

## 2021-06-14 LAB — HEMOGLOBIN A1C
Est. average glucose Bld gHb Est-mCnc: 123 mg/dL
Hgb A1c MFr Bld: 5.9 % — ABNORMAL HIGH (ref 4.8–5.6)

## 2021-06-18 ENCOUNTER — Ambulatory Visit (HOSPITAL_COMMUNITY): Payer: BC Managed Care – PPO | Attending: Cardiology

## 2021-06-18 ENCOUNTER — Encounter (HOSPITAL_COMMUNITY): Payer: Self-pay | Admitting: Cardiovascular Disease

## 2021-06-19 ENCOUNTER — Ambulatory Visit (HOSPITAL_BASED_OUTPATIENT_CLINIC_OR_DEPARTMENT_OTHER): Payer: BC Managed Care – PPO | Attending: Internal Medicine | Admitting: Internal Medicine

## 2021-06-19 ENCOUNTER — Other Ambulatory Visit: Payer: Self-pay

## 2021-06-19 DIAGNOSIS — G4733 Obstructive sleep apnea (adult) (pediatric): Secondary | ICD-10-CM

## 2021-06-21 ENCOUNTER — Ambulatory Visit (HOSPITAL_COMMUNITY)
Admission: RE | Admit: 2021-06-21 | Discharge: 2021-06-21 | Disposition: A | Discharge status: Home / Self Care | Payer: BC Managed Care – PPO | Source: Ambulatory Visit | Attending: Cardiovascular Disease | Admitting: Cardiovascular Disease

## 2021-06-21 ENCOUNTER — Other Ambulatory Visit: Payer: Self-pay

## 2021-06-21 DIAGNOSIS — I4891 Unspecified atrial fibrillation: Secondary | ICD-10-CM | POA: Insufficient documentation

## 2021-06-21 DIAGNOSIS — I119 Hypertensive heart disease without heart failure: Secondary | ICD-10-CM | POA: Insufficient documentation

## 2021-06-21 DIAGNOSIS — R0602 Shortness of breath: Secondary | ICD-10-CM | POA: Diagnosis present

## 2021-06-21 LAB — ECHOCARDIOGRAM COMPLETE
Area-P 1/2: 3.27 cm2
Calc EF: 59.3 %
S' Lateral: 3.3 cm
Single Plane A2C EF: 55 %
Single Plane A4C EF: 61.8 %

## 2021-06-25 ENCOUNTER — Telehealth: Payer: Self-pay | Admitting: Cardiovascular Disease

## 2021-06-25 NOTE — Telephone Encounter (Signed)
Geralynn Rile, MD  ?06/24/2021  4:36 PM EST   ?  ?Normal echo. My chart. -W  ? ? ?Spoke with patient, patient aware and verbalized understanding.  ?

## 2021-06-25 NOTE — Telephone Encounter (Signed)
Patient called for results to his Echo.  ?

## 2021-06-27 ENCOUNTER — Encounter (INDEPENDENT_AMBULATORY_CARE_PROVIDER_SITE_OTHER): Payer: Self-pay | Admitting: Bariatrics

## 2021-06-27 ENCOUNTER — Other Ambulatory Visit: Payer: Self-pay

## 2021-06-27 ENCOUNTER — Ambulatory Visit (INDEPENDENT_AMBULATORY_CARE_PROVIDER_SITE_OTHER): Payer: 59 | Admitting: Bariatrics

## 2021-06-27 VITALS — BP 127/83 | HR 88 | Temp 97.9°F | Ht 75.0 in | Wt >= 6400 oz

## 2021-06-27 DIAGNOSIS — E669 Obesity, unspecified: Secondary | ICD-10-CM

## 2021-06-27 DIAGNOSIS — E559 Vitamin D deficiency, unspecified: Secondary | ICD-10-CM

## 2021-06-27 DIAGNOSIS — Z6841 Body Mass Index (BMI) 40.0 and over, adult: Secondary | ICD-10-CM

## 2021-06-27 DIAGNOSIS — R7303 Prediabetes: Secondary | ICD-10-CM | POA: Diagnosis not present

## 2021-06-27 DIAGNOSIS — E786 Lipoprotein deficiency: Secondary | ICD-10-CM | POA: Diagnosis not present

## 2021-06-27 MED ORDER — VITAMIN D (ERGOCALCIFEROL) 1.25 MG (50000 UNIT) PO CAPS
50000.0000 [IU] | ORAL_CAPSULE | ORAL | 0 refills | Status: DC
Start: 2021-06-27 — End: 2021-07-11

## 2021-06-28 ENCOUNTER — Encounter (INDEPENDENT_AMBULATORY_CARE_PROVIDER_SITE_OTHER): Payer: Self-pay | Admitting: Bariatrics

## 2021-06-28 NOTE — Progress Notes (Signed)
? ? ? ?Chief Complaint:  ? ?OBESITY ?Brent Mccoy is here to discuss his progress with his obesity treatment plan along with follow-up of his obesity related diagnoses. Brent Mccoy is on the Category 4 Plan and states he is following his eating plan approximately 95% of the time. Brent Mccoy states he is doing 0 minutes 0 times per week. ? ?Today's visit was #: 2 ?Starting weight: 446 lbs ?Starting date: 06/13/2021 ?Today's weight: 438 lbs ?Today's date: 06/27/2021 ?Total lbs lost to date: 8 lbs ?Total lbs lost since last in-office visit: 8 lbs ? ?Interim History: Brent Mccoy is down 8 lbs from his initial visit. His goal ultimately is to lose 150 lbs. ? ?Subjective:  ? ?1. Low HDL (under 40) ?Brent Mccoy is not on medications currently. His last HDL was 37. ? ?2. Vitamin D deficiency ?Brent Mccoy is not on medications currently. His last Vitamin D was 10.5. ? ?3. Pre-diabetes ?Brent Mccoy is not on medications. His last A1C was 5.9. His insulin was 21.9. ? ?Assessment/Plan:  ? ?1. Low HDL (under 40) ?Cardiovascular risk and specific lipid/LDL goals reviewed.  Brent Mccoy will decrease carbohydrates. He will increase activity as tolerated. We discussed several lifestyle modifications today and Brent Mccoy will continue to work on diet, exercise and weight loss efforts. Orders and follow up as documented in patient record.  ? ?Counseling ?Intensive lifestyle modifications are the first line treatment for this issue. ?Dietary changes: Increase soluble fiber. Decrease simple carbohydrates. ?Exercise changes: Moderate to vigorous-intensity aerobic activity 150 minutes per week if tolerated. ?Lipid-lowering medications: see documented in medical record.  ? ?2. Vitamin D deficiency ?Low Vitamin D level contributes to fatigue and are associated with obesity, breast, and colon cancer. We will refill prescription Vitamin D 50,000 IU every week for 1 month with no refills and Brent Mccoy will follow-up for routine testing of Vitamin D, at least 2-3 times per year to  avoid over-replacement. ? ?- Vitamin D, Ergocalciferol, (DRISDOL) 1.25 MG (50000 UNIT) CAPS capsule; Take 1 capsule (50,000 Units total) by mouth every 7 (seven) days.  Dispense: 4 capsule; Refill: 0 ? ?3. Pre-diabetes ?Brent Mccoy will continue to work on weight loss, exercise, and decreasing simple carbohydrates to help decrease the risk of diabetes.  ? ?4. Obesity, current BMI of 54.7 ?Brent Mccoy is currently in the action stage of change. As such, his goal is to continue with weight loss efforts. He has agreed to the Category 4 Plan.  ? ?Brent Mccoy will continue to adhere to the plan 90-95%. He will continue meal planning. We reviewed labs from 06/13/2021 Lipid panel and Vitamin D.  ? ?Exercise goals: No exercise has been prescribed at this time. ? ?Behavioral modification strategies: increasing lean protein intake, decreasing simple carbohydrates, increasing vegetables, increasing water intake, decreasing eating out, no skipping meals, meal planning and cooking strategies, keeping healthy foods in the home, and planning for success. ? ?Brent Mccoy has agreed to follow-up with our clinic in 2 weeks. He was informed of the importance of frequent follow-up visits to maximize his success with intensive lifestyle modifications for his multiple health conditions.  ? ?Objective:  ? ?Blood pressure 127/83, pulse 88, temperature 97.9 ?F (36.6 ?C), height '6\' 3"'$  (1.905 m), weight (!) 438 lb (198.7 kg), SpO2 99 %. ?Body mass index is 54.75 kg/m?. ?Brent Mccoy is walking with a cane. ? ?General: Cooperative, alert, well developed, in no acute distress. ?HEENT: Conjunctivae and lids unremarkable. ?Cardiovascular: Regular rhythm.  ?Lungs: Normal work of breathing. ?Neurologic: No focal deficits.  ? ?Lab Results  ?Component Value Date  ?  CREATININE 1.29 04/25/2021  ? BUN 18 04/25/2021  ? NA 137 04/25/2021  ? K 3.8 04/25/2021  ? CL 102 04/25/2021  ? CO2 29 04/25/2021  ? ?Lab Results  ?Component Value Date  ? ALT 18 03/29/2021  ? AST 19 03/29/2021   ? ALKPHOS 52 03/29/2021  ? BILITOT 0.7 03/29/2021  ? ?Lab Results  ?Component Value Date  ? HGBA1C 5.9 (H) 06/13/2021  ? ?Lab Results  ?Component Value Date  ? INSULIN 21.9 06/13/2021  ? ?Lab Results  ?Component Value Date  ? TSH 1.810 06/07/2021  ? ?Lab Results  ?Component Value Date  ? CHOL 169 06/13/2021  ? HDL 37 (L) 06/13/2021  ? LDLCALC 116 (H) 06/13/2021  ? TRIG 83 06/13/2021  ? ?Lab Results  ?Component Value Date  ? VD25OH 10.8 (L) 06/13/2021  ? ?Lab Results  ?Component Value Date  ? WBC 4.0 03/29/2021  ? HGB 14.5 03/29/2021  ? HCT 45.5 03/29/2021  ? MCV 90.2 03/29/2021  ? PLT 170.0 03/29/2021  ? ?No results found for: IRON, TIBC, FERRITIN ? ?Attestation Statements:  ? ?Reviewed by clinician on day of visit: allergies, medications, problem list, medical history, surgical history, family history, social history, and previous encounter notes. ? ?I, Lizbeth Bark, RMA, am acting as transcriptionist for [employer], DO. ? ?I have reviewed the above documentation for accuracy and completeness, and I agree with the above. Jearld Lesch, DO ? ?

## 2021-07-06 ENCOUNTER — Telehealth: Payer: Self-pay | Admitting: Cardiovascular Disease

## 2021-07-06 NOTE — Telephone Encounter (Signed)
Contacted patient, all questions regarding ECHO and lab results answered. ?Patient had no other concerns/questions.  ?Thankful for call back.  ? ?

## 2021-07-06 NOTE — Telephone Encounter (Signed)
Patient wanted to know if the Echo he had done would look for plaque in his arteries.   ?

## 2021-07-11 ENCOUNTER — Ambulatory Visit (INDEPENDENT_AMBULATORY_CARE_PROVIDER_SITE_OTHER): Payer: BC Managed Care – PPO | Admitting: Nurse Practitioner

## 2021-07-11 ENCOUNTER — Encounter (INDEPENDENT_AMBULATORY_CARE_PROVIDER_SITE_OTHER): Payer: Self-pay | Admitting: Nurse Practitioner

## 2021-07-11 ENCOUNTER — Other Ambulatory Visit: Payer: Self-pay

## 2021-07-11 VITALS — BP 115/80 | HR 87 | Temp 97.8°F | Ht 75.0 in | Wt >= 6400 oz

## 2021-07-11 DIAGNOSIS — R7303 Prediabetes: Secondary | ICD-10-CM | POA: Diagnosis not present

## 2021-07-11 DIAGNOSIS — E559 Vitamin D deficiency, unspecified: Secondary | ICD-10-CM

## 2021-07-11 DIAGNOSIS — G4733 Obstructive sleep apnea (adult) (pediatric): Secondary | ICD-10-CM

## 2021-07-11 DIAGNOSIS — Z6841 Body Mass Index (BMI) 40.0 and over, adult: Secondary | ICD-10-CM

## 2021-07-11 DIAGNOSIS — I10 Essential (primary) hypertension: Secondary | ICD-10-CM | POA: Diagnosis not present

## 2021-07-11 DIAGNOSIS — E669 Obesity, unspecified: Secondary | ICD-10-CM

## 2021-07-11 MED ORDER — VITAMIN D (ERGOCALCIFEROL) 1.25 MG (50000 UNIT) PO CAPS: 50000.0000 [IU] | ORAL_CAPSULE | ORAL | 0 refills | Status: DC

## 2021-07-12 NOTE — Progress Notes (Signed)
? ? ? ?Chief Complaint:  ? ?OBESITY ?Brent Mccoy is here to discuss his progress with his obesity treatment plan along with follow-up of his obesity related diagnoses. Brent Mccoy is on the Category 4 Plan and states Brent Mccoy is following his eating plan approximately 95% of the time. Brent Mccoy states Brent Mccoy is not exercising regularly at this time. ? ?Today's visit was #: 3 ?Starting weight: 446 lbs ?Starting date: 06/13/2021 ?Today's weight: 430 lbs ?Today's date: 07/11/2021 ?Total lbs lost to date: 16 lbs ?Total lbs lost since last in-office visit: 8 lbs ? ?Interim History: Brent Mccoy has done well with weight loss.  Denies hunger and cravings.  Brent Mccoy has not been weighing his proteins.  His goal is to lose 150 lbs. ? ?Subjective:  ? ?1. Vitamin D deficiency ?Brent Mccoy is currently taking prescription vitamin D 50,000 IU each week. Brent Mccoy denies nausea, vomiting or muscle weakness. ?Last vitamin D was 10.8 ? ?2. Pre-diabetes ?Last A1c was 5.9 and fasting insulin was 21.9.  Brent Mccoy is not currently on medication. ? ?3. Essential hypertension ?Taking Diovan-HCT and Norvasc.  BP at goal today.  Denies side effects.  Denies CP/shortness of breath/palpitations.  Had echo on 06/21/2021. ? ?4. Obstructive sleep apnea ?Wearing CPAP nightly.  Seeing Pulmonology.  Recently has started O2 2L nightly along with CPAP. ? ?Assessment/Plan:  ? ?1. Vitamin D deficiency ?Refill vitamin D 50,000 IU once weekly x1. ? ?- Refill Vitamin D, Ergocalciferol, (DRISDOL) 1.25 MG (50000 UNIT) CAPS capsule; Take 1 capsule (50,000 Units total) by mouth every 7 (seven) days.  Dispense: 4 capsule; Refill: 0 ? ?2. Pre-diabetes ?Brent Mccoy will continue to work on weight loss, exercise, and decreasing simple carbohydrates to help decrease the risk of diabetes.  ? ?3. Essential hypertension ?Continue to follow-up with Cardiology and PCP.  Continue medications as directed. ? ?4. Obstructive sleep apnea ?Continue CPAP nightly. ? ?5. Obesity, current BMI of 53.8 ? ?Brent Mccoy is currently in the  action stage of change. As such, his goal is to continue with weight loss efforts. Brent Mccoy has agreed to the Category 4 Plan.  ? ?Exercise goals:  Plans to start going to the gym. ? ?Behavioral modification strategies: increasing lean protein intake, increasing water intake, no skipping meals, and meal planning and cooking strategies. ? ?Brent Mccoy has agreed to follow-up with our clinic in 2 weeks. Brent Mccoy was informed of the importance of frequent follow-up visits to maximize his success with intensive lifestyle modifications for his multiple health conditions.  ? ?Objective:  ? ?Blood pressure 115/80, pulse 87, temperature 97.8 ?F (36.6 ?C), height '6\' 3"'$  (1.905 m), weight (!) 430 lb (195 kg), SpO2 97 %. ?Body mass index is 53.75 kg/m?. ? ?General: Cooperative, alert, well developed, in no acute distress. ?HEENT: Conjunctivae and lids unremarkable. ?Cardiovascular: Regular rhythm.  ?Lungs: Normal work of breathing. ?Neurologic: No focal deficits.  ? ?Lab Results  ?Component Value Date  ? CREATININE 1.29 04/25/2021  ? BUN 18 04/25/2021  ? NA 137 04/25/2021  ? K 3.8 04/25/2021  ? CL 102 04/25/2021  ? CO2 29 04/25/2021  ? ?Lab Results  ?Component Value Date  ? ALT 18 03/29/2021  ? AST 19 03/29/2021  ? ALKPHOS 52 03/29/2021  ? BILITOT 0.7 03/29/2021  ? ?Lab Results  ?Component Value Date  ? HGBA1C 5.9 (H) 06/13/2021  ? ?Lab Results  ?Component Value Date  ? INSULIN 21.9 06/13/2021  ? ?Lab Results  ?Component Value Date  ? TSH 1.810 06/07/2021  ? ?Lab Results  ?Component Value Date  ?  CHOL 169 06/13/2021  ? HDL 37 (L) 06/13/2021  ? LDLCALC 116 (H) 06/13/2021  ? TRIG 83 06/13/2021  ? ?Lab Results  ?Component Value Date  ? VD25OH 10.8 (L) 06/13/2021  ? ?Lab Results  ?Component Value Date  ? WBC 4.0 03/29/2021  ? HGB 14.5 03/29/2021  ? HCT 45.5 03/29/2021  ? MCV 90.2 03/29/2021  ? PLT 170.0 03/29/2021  ? ?Attestation Statements:  ? ?Reviewed by clinician on day of visit: allergies, medications, problem list, medical history, surgical  history, family history, social history, and previous encounter notes. ? ?Time spent on visit including pre-visit chart review and post-visit care and charting was 30 minutes.  ? ?I, Water quality scientist, CMA, am acting as transcriptionist for Everardo Pacific, [place]. ? ?I have reviewed the above documentation for accuracy and completeness, and I agree with the above. Everardo Pacific, FNP  ?

## 2021-07-19 ENCOUNTER — Telehealth: Payer: Self-pay

## 2021-07-19 ENCOUNTER — Other Ambulatory Visit (HOSPITAL_COMMUNITY): Payer: Self-pay | Admitting: Cardiovascular Disease

## 2021-07-19 DIAGNOSIS — I25119 Atherosclerotic heart disease of native coronary artery with unspecified angina pectoris: Secondary | ICD-10-CM

## 2021-07-19 NOTE — Telephone Encounter (Signed)
Contacted patient in regards to Cardiac PET scan- patient states he would like to have this completed.  ?MD to be made aware-  ?Instructions to follow.  ? ?Patient also wanted to make MD aware that he has lost 26 lbs, since beginning his weight loss journey with [facility].  ? ? ?

## 2021-07-26 ENCOUNTER — Ambulatory Visit (INDEPENDENT_AMBULATORY_CARE_PROVIDER_SITE_OTHER): Payer: BC Managed Care – PPO | Admitting: Nurse Practitioner

## 2021-07-30 ENCOUNTER — Ambulatory Visit: Payer: BC Managed Care – PPO | Admitting: Internal Medicine

## 2021-08-06 ENCOUNTER — Ambulatory Visit: Payer: BC Managed Care – PPO | Admitting: Internal Medicine

## 2021-08-06 ENCOUNTER — Telehealth (HOSPITAL_COMMUNITY): Payer: Self-pay | Admitting: *Deleted

## 2021-08-06 NOTE — Telephone Encounter (Signed)
Reaching out to patient to offer assistance regarding upcoming cardiac imaging study; pt verbalizes understanding of appt date/time, parking situation and where to check in, pre-test NPO status and verified current allergies; name and call back number provided for further questions should they arise ? ?Gordy Clement RN Navigator Cardiac Imaging ?[facility] ?[phone] office ?[phone] cell ? ?Patient aware to arrive by 8:30am and to avoid caffeine for 12 hours prior to his cardiac PET scan. ?

## 2021-08-07 ENCOUNTER — Encounter (HOSPITAL_COMMUNITY)
Admission: RE | Admit: 2021-08-07 | Discharge: 2021-08-07 | Disposition: A | Discharge status: Home / Self Care | Payer: BC Managed Care – PPO | Source: Ambulatory Visit | Attending: Cardiovascular Disease | Admitting: Cardiovascular Disease

## 2021-08-07 DIAGNOSIS — I25119 Atherosclerotic heart disease of native coronary artery with unspecified angina pectoris: Secondary | ICD-10-CM | POA: Insufficient documentation

## 2021-08-07 LAB — NM PET CT CARDIAC PERFUSION MULTI W/ABSOLUTE BLOODFLOW
Base ST Depression (mm): 0 mm
MBFR: 2.6
Nuc Rest EF: 50 %
Nuc Stress EF: 59 %
Peak HR: 103 {beats}/min
Rest HR: 85 {beats}/min
Rest MBF: 0.7 ml/g/min
Rest Nuclear Isotope Dose: 29.9 mCi
Rest perfusion cavity size (mL): 165 mL
ST Depression (mm): 0 mm
Stress MBF: 1.8 ml/g/min
Stress Nuclear Isotope Dose: 29.9 mCi
Stress perfusion cavity size (mL): 176 mL
TID: 0.97

## 2021-08-07 MED ORDER — REGADENOSON 0.4 MG/5ML IV SOLN
INTRAVENOUS | Status: AC
  Filled 2021-08-07: qty 5

## 2021-08-07 MED ORDER — RUBIDIUM RB82 GENERATOR (RUBYFILL)
29.9400 | PACK | Freq: Once | INTRAVENOUS | Status: AC
  Administered 2021-08-07: 29.94 via INTRAVENOUS

## 2021-08-07 MED ORDER — RUBIDIUM RB82 GENERATOR (RUBYFILL)
29.9500 | PACK | Freq: Once | INTRAVENOUS | Status: AC
  Administered 2021-08-07: 29.95 via INTRAVENOUS

## 2021-08-09 ENCOUNTER — Ambulatory Visit (INDEPENDENT_AMBULATORY_CARE_PROVIDER_SITE_OTHER): Payer: BC Managed Care – PPO | Admitting: Nurse Practitioner

## 2021-08-09 VITALS — BP 108/73 | HR 91 | Temp 98.4°F | Ht 75.0 in | Wt >= 6400 oz

## 2021-08-09 DIAGNOSIS — I10 Essential (primary) hypertension: Secondary | ICD-10-CM | POA: Diagnosis not present

## 2021-08-09 DIAGNOSIS — Z6841 Body Mass Index (BMI) 40.0 and over, adult: Secondary | ICD-10-CM | POA: Diagnosis not present

## 2021-08-09 DIAGNOSIS — E669 Obesity, unspecified: Secondary | ICD-10-CM | POA: Diagnosis not present

## 2021-08-20 NOTE — Progress Notes (Signed)
? ? ? ?Chief Complaint:  ? ?OBESITY ?Brent Mccoy is here to discuss his progress with his obesity treatment plan along with follow-up of his obesity related diagnoses. Brent Mccoy is on the Category 4 Plan and states he is following his eating plan approximately 85% of the time. Brent Mccoy states he is doing 0 minutes 0 times per week. ? ?Today's visit was #: 4 ?Starting weight: 446 lbs ?Starting date: 06/13/2021 ?Today's weight: 422 lbs ?Today's date: 08/09/2021 ?Total lbs lost to date: 24 lbs ?Total lbs lost since last in-office visit: 8 lbs ? ?Interim History: Brent Mccoy has done well since his last visit. He has skipped a couple of meals. He is meeting his calories and protein goals. Snacks: rice cakes, fruit cups, popcorn, granola bars, and yogurt.  Not struggling with hunger or cravings.  ? ?Subjective:  ? ?1. Essential hypertension ?Brent Mccoy's blood pressure was at goal today. He denies chest pains, palpitations, and dizziness. He had an Echo on 06/21/2021. ? ?Assessment/Plan:  ? ?1. Essential hypertension ?Brent Mccoy will continue to follow up with cardiology with primary care physician. He will continue his medications. He is working on healthy weight loss and exercise to improve blood pressure control. We will watch for signs of hypotension as he continues his lifestyle modifications. ? ?2. Obesity, current BMI of 52.8 ?Brent Mccoy is currently in the action stage of change. As such, his goal is to continue with weight loss efforts. He has agreed to the Category 4 Plan.  ? ?Handouts: Protein contents of food.  ? ?Exercise goals: No exercise has been prescribed at this time. ? ?Behavioral modification strategies: increasing lean protein intake, increasing water intake, and no skipping meals. ? ?Brent Mccoy has agreed to follow-up with our clinic in 3 weeks. He was informed of the importance of frequent follow-up visits to maximize his success with intensive lifestyle modifications for his multiple health conditions.  ? ?Objective:   ? ?Blood pressure 108/73, pulse 91, temperature 98.4 ?F (36.9 ?C), height '6\' 3"'$  (1.905 m), weight (!) 422 lb (191.4 kg), SpO2 100 %. ?Body mass index is 52.75 kg/m?. ? ?General: Cooperative, alert, well developed, in no acute distress. ?HEENT: Conjunctivae and lids unremarkable. ?Cardiovascular: Regular rhythm.  ?Lungs: Normal work of breathing. ?Neurologic: No focal deficits.  ? ?Lab Results  ?Component Value Date  ? CREATININE 1.29 04/25/2021  ? BUN 18 04/25/2021  ? NA 137 04/25/2021  ? K 3.8 04/25/2021  ? CL 102 04/25/2021  ? CO2 29 04/25/2021  ? ?Lab Results  ?Component Value Date  ? ALT 18 03/29/2021  ? AST 19 03/29/2021  ? ALKPHOS 52 03/29/2021  ? BILITOT 0.7 03/29/2021  ? ?Lab Results  ?Component Value Date  ? HGBA1C 5.9 (H) 06/13/2021  ? ?Lab Results  ?Component Value Date  ? INSULIN 21.9 06/13/2021  ? ?Lab Results  ?Component Value Date  ? TSH 1.810 06/07/2021  ? ?Lab Results  ?Component Value Date  ? CHOL 169 06/13/2021  ? HDL 37 (L) 06/13/2021  ? LDLCALC 116 (H) 06/13/2021  ? TRIG 83 06/13/2021  ? ?Lab Results  ?Component Value Date  ? VD25OH 10.8 (L) 06/13/2021  ? ?Lab Results  ?Component Value Date  ? WBC 4.0 03/29/2021  ? HGB 14.5 03/29/2021  ? HCT 45.5 03/29/2021  ? MCV 90.2 03/29/2021  ? PLT 170.0 03/29/2021  ? ?No results found for: IRON, TIBC, FERRITIN ? ?Attestation Statements:  ? ?Reviewed by clinician on day of visit: allergies, medications, problem list, medical history, surgical history, family history, social  history, and previous encounter notes. ? ?Time spent on visit including pre-visit chart review and post-visit care and charting was 30 minutes.  ? ?I, Lizbeth Bark, RMA, am acting as Location manager for Everardo Pacific, FNP. ? ?I have reviewed the above documentation for accuracy and completeness, and I agree with the above. Everardo Pacific, FNP  ?

## 2021-09-04 ENCOUNTER — Ambulatory Visit (INDEPENDENT_AMBULATORY_CARE_PROVIDER_SITE_OTHER): Payer: BC Managed Care – PPO | Admitting: Nurse Practitioner

## 2021-09-05 ENCOUNTER — Ambulatory Visit (INDEPENDENT_AMBULATORY_CARE_PROVIDER_SITE_OTHER): Payer: BC Managed Care – PPO | Admitting: Family Medicine

## 2021-09-06 ENCOUNTER — Ambulatory Visit (INDEPENDENT_AMBULATORY_CARE_PROVIDER_SITE_OTHER): Payer: BC Managed Care – PPO | Admitting: Family Medicine

## 2021-09-06 ENCOUNTER — Encounter (INDEPENDENT_AMBULATORY_CARE_PROVIDER_SITE_OTHER): Payer: Self-pay | Admitting: Family Medicine

## 2021-09-06 VITALS — BP 135/85 | HR 98 | Temp 98.4°F | Ht 75.0 in | Wt >= 6400 oz

## 2021-09-06 DIAGNOSIS — I10 Essential (primary) hypertension: Secondary | ICD-10-CM | POA: Diagnosis not present

## 2021-09-06 DIAGNOSIS — Z9189 Other specified personal risk factors, not elsewhere classified: Secondary | ICD-10-CM

## 2021-09-06 DIAGNOSIS — E559 Vitamin D deficiency, unspecified: Secondary | ICD-10-CM | POA: Diagnosis not present

## 2021-09-06 DIAGNOSIS — Z6841 Body Mass Index (BMI) 40.0 and over, adult: Secondary | ICD-10-CM

## 2021-09-06 DIAGNOSIS — E669 Obesity, unspecified: Secondary | ICD-10-CM

## 2021-09-06 MED ORDER — VITAMIN D (ERGOCALCIFEROL) 1.25 MG (50000 UNIT) PO CAPS: 50000.0000 [IU] | ORAL_CAPSULE | ORAL | 0 refills | Status: DC

## 2021-09-07 ENCOUNTER — Ambulatory Visit: Payer: BC Managed Care – PPO | Admitting: Internal Medicine

## 2021-09-15 NOTE — Progress Notes (Unsigned)
Chief Complaint:   OBESITY Brent Mccoy is here to discuss his progress with his obesity treatment plan along with follow-up of his obesity related diagnoses. Brent Mccoy is on the Category 4 Plan and states he is following his eating plan approximately 75 to 80% of the time. Brent Mccoy states he is riding an exercise bike 30 minutes 3 times per week.  Today's visit was #: 5 Starting weight: 446 lbs Starting date: 06/13/2021 Today's weight: 425 lbs Today's date: 09/06/2021 Total lbs lost to date: 21 Total lbs lost since last in-office visit: 0  Interim History: This is Brent Mccoy's 1st visit with me. He is a patient to Dr Saul Fordyce. Brent Mccoy states that he missed his 2 to 3 week follow up appointment. He fell off the wagon a bit. His snacks are [employer], yogurt, or 60 calories sugar free popsicles. Brent Mccoy endorses that he is not eating his proteins.  Subjective:   1. Vitamin D deficiency He is currently taking prescription vitamin D 50,000 IU each week. He denies nausea, vomiting or muscle weakness.  Lab Results  Component Value Date   VD25OH 10.8 (L) 06/13/2021   2. Essential hypertension Brent Mccoy is on cough medicine and his blood pressure is slightly higher than prior.  3. At risk for dehydration Brent Mccoy is at risk for dehydration due to only drinking 40 to 50 ounces of liquid per day.  Assessment/Plan:  No orders of the defined types were placed in this encounter.   Medications Discontinued During This Encounter  Medication Reason   Vitamin D, Ergocalciferol, (DRISDOL) 1.25 MG (50000 UNIT) CAPS capsule Reorder     Meds ordered this encounter  Medications   Vitamin D, Ergocalciferol, (DRISDOL) 1.25 MG (50000 UNIT) CAPS capsule    Sig: Take 1 capsule (50,000 Units total) by mouth every 7 (seven) days.    Dispense:  4 capsule    Refill:  0     1. Vitamin D deficiency Low Vitamin D level contributes to fatigue and are associated with obesity, breast, and colon cancer. He agrees  to continue to take prescription Vitamin D '@50'$ ,000 IU every week and will follow-up for routine testing of Vitamin D, at least 2-3 times per year to avoid over-replacement.  - Vitamin D, Ergocalciferol, (DRISDOL) 1.25 MG (50000 UNIT) CAPS capsule; Take 1 capsule (50,000 Units total) by mouth every 7 (seven) days.  Dispense: 4 capsule; Refill: 0  2. Essential hypertension Brent Mccoy's blood pressure is at goal, although it is high normal. He agrees to continue his medications per his PCP and cardiologist. Brent Mccoy agrees to decrease salt, increase his water intake, and to follow his prudent nutritional plan.  3. At risk for dehydration Brent Mccoy is at higher than average risk of dehydration. Brent Mccoy was given more than 9 minutes of proper hydration counseling today. We discussed the signs and symptoms of dehydration, some of which may include muscle cramping, constipation or even orthostatic symptoms. Counseling on the prevention of dehydration was also provided today. Brent Mccoy is at risk for dehydration due to weight loss, lifestyle and behavorial habits and possibly due to taking certain medication(s). He was encouraged to adequately hydrate and monitor fluid status to avoid dehydration as well as weight loss plateaus. Unless pre-existing renal or cardiopulmonary conditions exist, in which patient was told to limit their fluid intake, I recommended roughly one half of their weight in pounds to be the approximate ounces of non-caloric, non-caffeinated beverages they should drink per day; including more if they are engaging in exercise.  Brent Mccoy is at higher than average risk of dehydration. Brent Mccoy was given more than 9 minutes of proper hydration counseling today. We discussed the signs and symptoms of dehydration, some of which may include muscle cramping, constipation, or even orthostatic symptoms. Counseling on the prevention of dehydration was also provided today. Brent Mccoy is at risk for dehydration due to  weight loss, lifestyle and behavorial habits, and possibly due to taking certain medication(s). He was encouraged to adequately hydrate and monitor fluid status to avoid dehydration as well as weight loss plateaus. Unless pre-existing renal or cardiopulmonary conditions exist, which pt was told to limit their fluid intake. I recommended roughly one half of their weight in pounds to be the approximate ounces of non-caloric, non-caffeinated beverages they should drink per day; including more if they are engaging in exercise.   4. Obesity, current BMI of 53.2 Brent Mccoy is currently in the action stage of change. As such, his goal is to continue with weight loss efforts. He has agreed to the Category 4 Plan.   Exercise goals:  As is  Behavioral modification strategies: increasing lean protein intake, decreasing simple carbohydrates, and planning for success.  Brent Mccoy has agreed to follow-up with our clinic in 2 to 3 weeks. He was informed of the importance of frequent follow-up visits to maximize his success with intensive lifestyle modifications for his multiple health conditions.   Objective:   Blood pressure 135/85, pulse 98, temperature 98.4 F (36.9 C), height '6\' 3"'$  (1.905 m), weight (!) 425 lb (192.8 kg), SpO2 97 %. Body mass index is 53.12 kg/m.  General: Cooperative, alert, well developed, in no acute distress. HEENT: Conjunctivae and lids unremarkable. Cardiovascular: Regular rhythm.  Lungs: Normal work of breathing. Neurologic: No focal deficits.   Lab Results  Component Value Date   CREATININE 1.29 04/25/2021   BUN 18 04/25/2021   NA 137 04/25/2021   K 3.8 04/25/2021   CL 102 04/25/2021   CO2 29 04/25/2021   Lab Results  Component Value Date   ALT 18 03/29/2021   AST 19 03/29/2021   ALKPHOS 52 03/29/2021   BILITOT 0.7 03/29/2021   Lab Results  Component Value Date   HGBA1C 5.9 (H) 06/13/2021   Lab Results  Component Value Date   INSULIN 21.9 06/13/2021   Lab  Results  Component Value Date   TSH 1.810 06/07/2021   Lab Results  Component Value Date   CHOL 169 06/13/2021   HDL 37 (L) 06/13/2021   LDLCALC 116 (H) 06/13/2021   TRIG 83 06/13/2021   Lab Results  Component Value Date   VD25OH 10.8 (L) 06/13/2021   Lab Results  Component Value Date   WBC 4.0 03/29/2021   HGB 14.5 03/29/2021   HCT 45.5 03/29/2021   MCV 90.2 03/29/2021   PLT 170.0 03/29/2021   No results found for: IRON, TIBC, FERRITIN  Attestation Statements:   Reviewed by clinician on day of visit: allergies, medications, problem list, medical history, surgical history, family history, social history, and previous encounter notes.  IMarcille Blanco, CMA, am acting as transcriptionist for [employer], DO  I have reviewed the above documentation for accuracy and completeness, and I agree with the above. Marjory Sneddon, D.O.  The [place] was signed into law in 2016 which includes the topic of electronic health records.  This provides immediate access to information in MyChart.  This includes consultation notes, operative notes, office notes, lab results and pathology reports.  If  you have any questions about what you read please let us know at your next visit so we can discuss your concerns and take corrective action if need be.  We are right here with you.

## 2021-09-27 ENCOUNTER — Encounter (INDEPENDENT_AMBULATORY_CARE_PROVIDER_SITE_OTHER): Payer: Self-pay | Admitting: Nurse Practitioner

## 2021-09-27 ENCOUNTER — Ambulatory Visit (INDEPENDENT_AMBULATORY_CARE_PROVIDER_SITE_OTHER): Payer: BC Managed Care – PPO | Admitting: Nurse Practitioner

## 2021-09-27 VITALS — BP 127/81 | HR 92 | Temp 97.9°F | Ht 75.0 in | Wt >= 6400 oz

## 2021-09-27 DIAGNOSIS — E669 Obesity, unspecified: Secondary | ICD-10-CM

## 2021-09-27 DIAGNOSIS — E559 Vitamin D deficiency, unspecified: Secondary | ICD-10-CM

## 2021-09-27 DIAGNOSIS — I10 Essential (primary) hypertension: Secondary | ICD-10-CM | POA: Diagnosis not present

## 2021-09-27 DIAGNOSIS — Z6841 Body Mass Index (BMI) 40.0 and over, adult: Secondary | ICD-10-CM | POA: Diagnosis not present

## 2021-09-27 MED ORDER — VITAMIN D (ERGOCALCIFEROL) 1.25 MG (50000 UNIT) PO CAPS: 50000.0000 [IU] | ORAL_CAPSULE | ORAL | 0 refills | Status: DC

## 2021-10-02 NOTE — Progress Notes (Unsigned)
Chief Complaint:   OBESITY Brent Mccoy is here to discuss his progress with his obesity treatment plan along with follow-up of his obesity related diagnoses. Brent Mccoy is on the Category 4 Plan and states he is following his eating plan approximately 78% of the time. Brent Mccoy states he is using a foot peddler 30 minutes 3 times per week.  Today's visit was #: 6 Starting weight: 446 lbs Starting date: 06/13/2021 Today's weight: 419 lbs Today's date: 09/27/2021 Total lbs lost to date: 27 lbs Total lbs lost since last in-office visit: 6  Interim History: Brent Mccoy has done well since his last visit with weight loss. He's following the plan but is more of the [place]. He is not weighing his food. He is eating 2 meals and 5 snacks per day. Snacks: yogurt, 60 calories popsicles, peaches and Skinny pop and peanut butter.   Subjective:   1. Vitamin D deficiency Brent Mccoy is currently taking prescription Vit D 50,000 IU once a week. Denies any nausea, vomiting or muscle weakness.  2. Essential hypertension Brent Mccoy is currently taking Norvasc 10 mg + Diovan HCT 160-12.5 mg. His Diovan dose was decreased by his PCP 2 weeks ago. denies any chest pain, shortness of breath or palpitations.  Assessment/Plan:   1. Vitamin D deficiency We will refill Vit 50,000 IU once a week for 1 month with 0 refills. Side effects discussed.  Low Vitamin D level contributes to fatigue and are associated with obesity, breast, and colon cancer. He agrees to continue to take prescription Vitamin D '@50'$ ,000 IU every week and will follow-up for routine testing of Vitamin D, at least 2-3 times per year to avoid over-replacement.   -Refill Vitamin D, Ergocalciferol, (DRISDOL) 1.25 MG (50000 UNIT) CAPS capsule; Take 1 capsule (50,000 Units total) by mouth every 7 (seven) days.  Dispense: 4 capsule; Refill: 0  2. Essential hypertension Brent Mccoy is to continue to follow up with PCP.  Continue with medication as directed.  Brent Mccoy  is working on healthy weight loss and exercise to improve blood pressure control. We will watch for signs of hypotension as he continues his lifestyle modifications.   3. Obesity, current BMI of 52.4 Brent Mccoy is currently in the action stage of change. As such, his goal is to continue with weight loss efforts. He has agreed to the Category 4 Plan.   Exercise goals: As is.  Handouts: snack ideas 100+200 calories.  Behavioral modification strategies: increasing water intake, no skipping meals, and planning for success.  Brent Mccoy has agreed to follow-up with our clinic in 4 weeks. He was informed of the importance of frequent follow-up visits to maximize his success with intensive lifestyle modifications for his multiple health conditions.   Objective:   Blood pressure 127/81, pulse 92, temperature 97.9 F (36.6 C), height '6\' 3"'$  (1.905 m), weight (!) 419 lb (190.1 kg), SpO2 97 %. Body mass index is 52.37 kg/m.  General: Cooperative, alert, well developed, in no acute distress. HEENT: Conjunctivae and lids unremarkable. Cardiovascular: Regular rhythm.  Lungs: Normal work of breathing. Neurologic: No focal deficits.   Lab Results  Component Value Date   CREATININE 1.29 04/25/2021   BUN 18 04/25/2021   NA 137 04/25/2021   K 3.8 04/25/2021   CL 102 04/25/2021   CO2 29 04/25/2021   Lab Results  Component Value Date   ALT 18 03/29/2021   AST 19 03/29/2021   ALKPHOS 52 03/29/2021   BILITOT 0.7 03/29/2021   Lab Results  Component Value Date  HGBA1C 5.9 (H) 06/13/2021   Lab Results  Component Value Date   INSULIN 21.9 06/13/2021   Lab Results  Component Value Date   TSH 1.810 06/07/2021   Lab Results  Component Value Date   CHOL 169 06/13/2021   HDL 37 (L) 06/13/2021   LDLCALC 116 (H) 06/13/2021   TRIG 83 06/13/2021   Lab Results  Component Value Date   VD25OH 10.8 (L) 06/13/2021   Lab Results  Component Value Date   WBC 4.0 03/29/2021   HGB 14.5 03/29/2021    HCT 45.5 03/29/2021   MCV 90.2 03/29/2021   PLT 170.0 03/29/2021   No results found for: "IRON", "TIBC", "FERRITIN"  Attestation Statements:   Reviewed by clinician on day of visit: allergies, medications, problem list, medical history, surgical history, family history, social history, and previous encounter notes.  I, Brendell Tyus, RMA, am acting as transcriptionist for Everardo Pacific, FNP..  I have reviewed the above documentation for accuracy and completeness, and I agree with the above. Everardo Pacific, FNP

## 2021-10-11 ENCOUNTER — Encounter (INDEPENDENT_AMBULATORY_CARE_PROVIDER_SITE_OTHER): Payer: Self-pay | Admitting: Nurse Practitioner

## 2021-10-11 ENCOUNTER — Ambulatory Visit (INDEPENDENT_AMBULATORY_CARE_PROVIDER_SITE_OTHER): Payer: BC Managed Care – PPO | Admitting: Nurse Practitioner

## 2021-10-11 VITALS — BP 107/70 | HR 82 | Temp 98.2°F | Ht 75.0 in | Wt >= 6400 oz

## 2021-10-11 DIAGNOSIS — Z6841 Body Mass Index (BMI) 40.0 and over, adult: Secondary | ICD-10-CM | POA: Diagnosis not present

## 2021-10-11 DIAGNOSIS — I10 Essential (primary) hypertension: Secondary | ICD-10-CM | POA: Diagnosis not present

## 2021-10-11 DIAGNOSIS — E669 Obesity, unspecified: Secondary | ICD-10-CM

## 2021-10-17 ENCOUNTER — Telehealth (INDEPENDENT_AMBULATORY_CARE_PROVIDER_SITE_OTHER): Payer: Self-pay

## 2021-10-17 NOTE — Telephone Encounter (Signed)
Patient was seen by Colletta [place] and he was told to ask his pulmonologist to order some lab work. Patient cant remember what labs stephanie told him to get done. Patient is getting seen today and would like a call back about what labs his other provider should order.

## 2021-10-17 NOTE — Telephone Encounter (Signed)
Returned call to pt. Pt advised per last OV note dated 10-11-2021 pt is due for CMP, lipids, Vit.D, A1c. Pt repeated them back and verbalized understanding.

## 2021-10-17 NOTE — Progress Notes (Signed)
HPI M never smoker followed for Sarcoid, Insomnia, OSA/CPAP,  complicated by HBP, allergic rhinitis, GERD, LeftDVT 2019, PE 2020/ Eliquis, Obesity, CKD3a,  NPSG [facility] 05/04/14 AHI 61.2/ hr, desaturation to 81%, CPAP to 11, body weight 422 lbs ACE 04/23/16- 68 ( 9-67) CBC with differential 04/23/2016-WNL --------------------------------------------------------------------------------------------   05/08/21- 61 year old M never smoker followed for Sarcoid, Insomnia, OSA/CPAP,  complicated  by HBP, allergic rhinitis, GERD, LeftDVT 2019, PE 2020/ Eliquis, Obesity, CKD3a, SVT, COVID infection May 2022, -Prednisone 8 day taper from 40 mg 04/03/21,  CPAP auto12-16 /Adapt-[place] Download compliance - 100%, AHI 3.5/ hr Body weight today- Covid vax-5 Phizer Flu vax-had Sarcoid activity indicated by elevated ACE level in January. Took prednisone taper.  -----Patient states that he is still having some shortness of breath that comes and goes and is not always with activity, brain fog, fatigue. Patient states that he feels like it is the same since last visit. He is not sure if his awareness of episodic dyspnea (mainly exertional) and "brain fog" might date back to COVID infection last spring.  We discussed possibility of long COVID.  He has not been anemic.  He remains very heavy and we understand this is a significant component of his dyspnea.  He is going to be going back to cardiology.  He denies any cough or wheeze, or chest pain. He did not recognize much change related to taking prednisone taper.  We discussed our shared preference not to stay on prednisone because of side effects and he is comfortable watching sarcoid status for now as long as there is no obvious progressive organ involvement. Lab- ACE level 71H on 04/25/21, 85H on 03/29/21, 68 H on 04/23/16 CXR 03/29/21-  IMPRESSION: Low lung volumes without evidence of acute cardiopulmonary disease  10/18/21- 61 year old M never smoker followed for  Sarcoid, Insomnia, OSA/CPAP,  complicated  by HBP, allergic rhinitis, GERD, Left DVT 2019, PE 2020/ Eliquis, Obesity, CKD3a, SVT, COVID infection May 2022, -Ambien 10,  CPAP auto12-16 /[employer] Download compliance -  Body weight today-425 lbs Covid vax-5 Phizer Flu vax-had He is in a weight loss program now reporting 39 pound weight loss with a goal of losing 150 pounds.  Strongly encouraged. Dyspnea comes and goes, at least partly related to exertion.  He denies wheeze or cough.  We are going to reorder PFT. He is concerned and wanting reassurance that sarcoid is not active-discussed. Cardiac PET 08/07/21- no ischemia, EF 50%, no CAD IMPRESSION: 1. Small amount of pericardial fluid without overt effusion. 2. No mediastinal or hilar mass or adenopathy. 3. No significant pulmonary findings. Aortic Atherosclerosis (ICD10-I70.0).    ROV-see HPI    + = positive Constitutional:   No-   weight loss, night sweats, fevers, chills, +fatigue, lassitude. HEENT:   No-  headaches, difficulty swallowing, tooth/dental problems, sore throat,       No-  sneezing, itching, ear ache, nasal congestion, post nasal drip,  CV:  No-   chest pain, orthopnea, PND, swelling in lower extremities, anasarca, dizziness, palpitations Resp: + shortness of breath with exertion or at rest.              No-   productive cough,   non-productive cough,  No- coughing up of blood.              No-   change in color of mucus.  No- wheezing.   Skin: No-   rash or lesions. GI:  No-   heartburn, indigestion, abdominal pain, nausea,  vomiting, diarrhea,                 change in bowel habits, loss of appetite GU: No-   dysuria, change in color of urine, no urgency or frequency.  No- flank pain. MS:  + joint pain or swelling.  No- decreased range of motion.  No- back pain. Neuro-     nothing unusual Psych:  No- change in mood or affect. No depression or anxiety. + memory loss.  OBJ General- Alert, Oriented,  Affect-appropriate, Distress- none acute, +morbidly obese/ big, laconic Skin- +rash-hyperpigmented area persists left lateral calf. , lesions- none, excoriation- none Lymphadenopathy- none Head- atraumatic            Eyes- Gross vision intact, PERRLA, conjunctivae clear secretions            Ears- Hearing, canals-normal            Nose- Clear, no-Septal dev, mucus, polyps, erosion, perforation             Throat- Mallampati II-III , mucosa clear , drainage- none, tonsils- atrophic Neck- flexible , trachea midline, no stridor , thyroid nl, carotid no bruit Chest - symmetrical excursion , unlabored           Heart/CV- RRR , no murmur , no gallop  , no rub, nl s1 s2                           - JVD- none , edema- none, stasis changes- none, varices- none           Lung- clear to P&A, wheeze- none, cough- none , dullness-none, rub- none           Chest wall-  Abd-  Br/ Gen/ Rectal- Not done, not indicated Extrem- cyanosis- none, clubbing, none, atrophy- none, strength- nl,  + cane Neuro- grossly intact to observation

## 2021-10-18 ENCOUNTER — Other Ambulatory Visit (INDEPENDENT_AMBULATORY_CARE_PROVIDER_SITE_OTHER): Payer: BC Managed Care – PPO

## 2021-10-18 ENCOUNTER — Ambulatory Visit (INDEPENDENT_AMBULATORY_CARE_PROVIDER_SITE_OTHER): Payer: BC Managed Care – PPO | Admitting: Internal Medicine

## 2021-10-18 ENCOUNTER — Encounter: Payer: Self-pay | Admitting: Internal Medicine

## 2021-10-18 VITALS — BP 130/84 | HR 86 | Temp 97.9°F | Ht 75.0 in | Wt >= 6400 oz

## 2021-10-18 DIAGNOSIS — I10 Essential (primary) hypertension: Secondary | ICD-10-CM | POA: Diagnosis not present

## 2021-10-18 DIAGNOSIS — R0602 Shortness of breath: Secondary | ICD-10-CM | POA: Diagnosis not present

## 2021-10-18 DIAGNOSIS — D869 Sarcoidosis, unspecified: Secondary | ICD-10-CM | POA: Diagnosis not present

## 2021-10-18 DIAGNOSIS — G4733 Obstructive sleep apnea (adult) (pediatric): Secondary | ICD-10-CM

## 2021-10-18 LAB — BASIC METABOLIC PANEL
BUN: 18 mg/dL (ref 6–23)
CO2: 27 mEq/L (ref 19–32)
Calcium: 9.2 mg/dL (ref 8.4–10.5)
Chloride: 103 mEq/L (ref 96–112)
Creatinine, Ser: 1.25 mg/dL (ref 0.40–1.50)
GFR: 61.67 mL/min (ref >60.00)
Glucose, Bld: 100 mg/dL — ABNORMAL HIGH (ref 70–99)
Potassium: 4 mEq/L (ref 3.5–5.1)
Sodium: 137 mEq/L (ref 135–145)

## 2021-10-18 LAB — LIPID PANEL
Cholesterol: 141 mg/dL (ref 0–200)
HDL: 40.3 mg/dL (ref >39.00)
LDL Cholesterol: 89 mg/dL (ref 0–99)
NonHDL: 100.63
Total CHOL/HDL Ratio: 3
Triglycerides: 57 mg/dL (ref 0.0–149.0)
VLDL: 11.4 mg/dL (ref 0.0–40.0)

## 2021-10-18 LAB — VITAMIN D 25 HYDROXY (VIT D DEFICIENCY, FRACTURES): VITD: 30.41 ng/mL (ref 30.00–100.00)

## 2021-10-18 NOTE — Patient Instructions (Addendum)
Order- lab- Angiotensin Converting Enzyme level, Lipid Panel, Vitamin D level, BMET            Dx Sarcoid, Hypertension  Order- schedule PFT   dx Sarcoid  Script sent for an albuterol "rescue inhaler"  - inhale 2 puffs, every 6 hours, if needed for shortness of breath    See if it helps

## 2021-10-19 ENCOUNTER — Telehealth: Payer: Self-pay | Admitting: Internal Medicine

## 2021-10-19 LAB — ANGIOTENSIN CONVERTING ENZYME: Angiotensin-Converting Enzyme: 53 U/L (ref 9–67)

## 2021-10-19 MED ORDER — ALBUTEROL SULFATE HFA 108 (90 BASE) MCG/ACT IN AERS
2.0000 | INHALATION_SPRAY | Freq: Four times a day (QID) | RESPIRATORY_TRACT | 6 refills | Status: AC | PRN
Start: 2021-10-19 — End: ?

## 2021-10-19 NOTE — Telephone Encounter (Signed)
Rx for Albuterol inhaler has been sent to preferred pharmacy for pt. Called and spoke with pt letting him know this had been done and he verbalized understanding. Nothing further needed.

## 2021-10-25 ENCOUNTER — Ambulatory Visit (INDEPENDENT_AMBULATORY_CARE_PROVIDER_SITE_OTHER): Payer: BC Managed Care – PPO | Admitting: Nurse Practitioner

## 2021-11-08 ENCOUNTER — Ambulatory Visit (INDEPENDENT_AMBULATORY_CARE_PROVIDER_SITE_OTHER): Payer: BC Managed Care – PPO | Admitting: Nurse Practitioner

## 2021-11-08 ENCOUNTER — Encounter (INDEPENDENT_AMBULATORY_CARE_PROVIDER_SITE_OTHER): Payer: Self-pay | Admitting: Nurse Practitioner

## 2021-11-08 VITALS — BP 121/76 | HR 86 | Temp 98.7°F | Ht 75.0 in | Wt >= 6400 oz

## 2021-11-08 DIAGNOSIS — Z6841 Body Mass Index (BMI) 40.0 and over, adult: Secondary | ICD-10-CM | POA: Diagnosis not present

## 2021-11-08 DIAGNOSIS — E559 Vitamin D deficiency, unspecified: Secondary | ICD-10-CM

## 2021-11-08 DIAGNOSIS — E669 Obesity, unspecified: Secondary | ICD-10-CM

## 2021-11-08 MED ORDER — VITAMIN D (ERGOCALCIFEROL) 1.25 MG (50000 UNIT) PO CAPS: 50000.0000 [IU] | ORAL_CAPSULE | ORAL | 0 refills | Status: DC

## 2021-11-12 NOTE — Progress Notes (Signed)
Chief Complaint:   OBESITY Brent Mccoy is here to discuss his progress with his obesity treatment plan along with follow-up of his obesity related diagnoses. Brent Mccoy is on the Category 4 Plan and states he is following his eating plan approximately ?% of the time. Brent Mccoy states he is exercising 0 minutes 0 times per week.  Today's visit was #: 8 Starting weight: 446 lbs Starting date: 06/13/2021 Today's weight: 420 lbs Today's date: 11/08/2021 Total lbs lost to date: 26 lbs Total lbs lost since last in-office visit: 0  Interim History: Brent Mccoy has had 2 family reunions since his last visit. He celebrated July 4th and had a senior prom. His house projects are finally finished. He has increased water intake since last visit. No celebrations or vacations coming up.   Subjective:   1. Vitamin D deficiency Labs discussed during visit today. Brent Mccoy took Vit D last 2 weeks ago.  Assessment/Plan:   1. Vitamin D deficiency We will refill Vit D 50,000 IU for 1 month with 0 refills.  Low Vitamin D level contributes to fatigue and are associated with obesity, breast, and colon cancer. He agrees to continue to take prescription Vitamin D '@50'$ ,000 IU every week and will follow-up for routine testing of Vitamin D, at least 2-3 times per year to avoid over-replacement.   -Refill Vitamin D, Ergocalciferol, (DRISDOL) 1.25 MG (50000 UNIT) CAPS capsule; Take 1 capsule (50,000 Units total) by mouth every 7 (seven) days.  Dispense: 4 capsule; Refill: 0  2. Obesity, current BMI of 52.5 Brent Mccoy is currently in the action stage of change. As such, his goal is to continue with weight loss efforts. He has agreed to the Category 4 Plan.   Exercise goals: All adults should avoid inactivity. Some physical activity is better than none, and adults who participate in any amount of physical activity gain some health benefits.  Labs reviewed in chart with with patient from Pulmonary.  Behavioral modification  strategies: increasing lean protein intake, increasing water intake, and no skipping meals.  Brent Mccoy has agreed to follow-up with our clinic in 4 weeks. He was informed of the importance of frequent follow-up visits to maximize his success with intensive lifestyle modifications for his multiple health conditions.   Objective:   Blood pressure 121/76, pulse 86, temperature 98.7 F (37.1 C), height '6\' 3"'$  (1.905 m), weight (!) 420 lb (190.5 kg), SpO2 98 %. Body mass index is 52.5 kg/m.  General: Cooperative, alert, well developed, in no acute distress. HEENT: Conjunctivae and lids unremarkable. Cardiovascular: Regular rhythm.  Lungs: Normal work of breathing. Neurologic: No focal deficits.   Lab Results  Component Value Date   CREATININE 1.25 10/18/2021   BUN 18 10/18/2021   NA 137 10/18/2021   K 4.0 10/18/2021   CL 103 10/18/2021   CO2 27 10/18/2021   Lab Results  Component Value Date   ALT 18 03/29/2021   AST 19 03/29/2021   ALKPHOS 52 03/29/2021   BILITOT 0.7 03/29/2021   Lab Results  Component Value Date   HGBA1C 5.9 (H) 06/13/2021   Lab Results  Component Value Date   INSULIN 21.9 06/13/2021   Lab Results  Component Value Date   TSH 1.810 06/07/2021   Lab Results  Component Value Date   CHOL 141 10/18/2021   HDL 40.30 10/18/2021   LDLCALC 89 10/18/2021   TRIG 57.0 10/18/2021   CHOLHDL 3 10/18/2021   Lab Results  Component Value Date   VD25OH 30.41 10/18/2021  VD25OH 10.8 (L) 06/13/2021   Lab Results  Component Value Date   WBC 4.0 03/29/2021   HGB 14.5 03/29/2021   HCT 45.5 03/29/2021   MCV 90.2 03/29/2021   PLT 170.0 03/29/2021   No results found for: "IRON", "TIBC", "FERRITIN"  Attestation Statements:   Reviewed by clinician on day of visit: allergies, medications, problem list, medical history, surgical history, family history, social history, and previous encounter notes.  Time spent on visit including pre-visit chart review and  post-visit care and charting was 30 minutes.   I, Brendell Tyus, RMA, am acting as transcriptionist for Everardo Pacific, FNP.  I have reviewed the above documentation for accuracy and completeness, and I agree with the above. Everardo Pacific, FNP

## 2021-11-23 ENCOUNTER — Encounter: Payer: Self-pay | Admitting: Internal Medicine

## 2021-11-23 DIAGNOSIS — R0602 Shortness of breath: Secondary | ICD-10-CM | POA: Insufficient documentation

## 2021-11-23 NOTE — Assessment & Plan Note (Signed)
Benefits from CPAP Plan-continue auto 12-16

## 2021-11-23 NOTE — Assessment & Plan Note (Signed)
Assess activity Plan-ACE level, lab panel, lab panel as requested

## 2021-11-23 NOTE — Assessment & Plan Note (Signed)
Strong support given to his participation in a weight loss program

## 2021-11-28 ENCOUNTER — Encounter (INDEPENDENT_AMBULATORY_CARE_PROVIDER_SITE_OTHER): Payer: Self-pay

## 2021-12-10 ENCOUNTER — Ambulatory Visit (INDEPENDENT_AMBULATORY_CARE_PROVIDER_SITE_OTHER): Payer: BC Managed Care – PPO | Admitting: Nurse Practitioner

## 2021-12-11 ENCOUNTER — Encounter (INDEPENDENT_AMBULATORY_CARE_PROVIDER_SITE_OTHER): Payer: Self-pay | Admitting: Nurse Practitioner

## 2021-12-11 ENCOUNTER — Ambulatory Visit (INDEPENDENT_AMBULATORY_CARE_PROVIDER_SITE_OTHER): Payer: BC Managed Care – PPO | Admitting: Nurse Practitioner

## 2021-12-11 VITALS — BP 123/79 | HR 87 | Temp 98.2°F | Ht 75.0 in | Wt >= 6400 oz

## 2021-12-11 DIAGNOSIS — E669 Obesity, unspecified: Secondary | ICD-10-CM | POA: Diagnosis not present

## 2021-12-11 DIAGNOSIS — E559 Vitamin D deficiency, unspecified: Secondary | ICD-10-CM | POA: Diagnosis not present

## 2021-12-11 DIAGNOSIS — Z6841 Body Mass Index (BMI) 40.0 and over, adult: Secondary | ICD-10-CM

## 2021-12-11 MED ORDER — VITAMIN D (ERGOCALCIFEROL) 1.25 MG (50000 UNIT) PO CAPS: 50000.0000 [IU] | ORAL_CAPSULE | ORAL | 0 refills | Status: DC

## 2021-12-19 NOTE — Progress Notes (Signed)
Chief Complaint:   OBESITY Brent Mccoy is here to discuss his progress with his obesity treatment plan along with follow-up of his obesity related diagnoses. [name] is on the Category 4 Plan and states he is following his eating plan approximately 60% of the time. [name] states he is stationary bike 30 minutes 2 times per week.  Today's visit was #: 9 Starting weight: 446 lbs Starting date: 06/13/2021 Today's weight: 414 lbs Today's date: 12/11/2021 Total lbs lost to date: 32 lbs Total lbs lost since last in-office visit: 6  Interim History: [name] has done well with weight loss since his last visit. Eating 2 meals and 4 snacks daily. Snacks:  100 Cal popcorn, popsicle, fruit and peanut butter crackers. Meeting protein goals. Drinking water, hot tea with monk fruit and Stevia. No upcoming celebrations. Denies cravings.  Subjective:   1. Vitamin D deficiency Brent Mccoy is currently taking prescription Vit D 50,000 IU once a week. Denies any nausea, vomiting or muscle weakness. He had Vit D checked with PCP's office--30.41 on 10/18/21.  Assessment/Plan:   1. Vitamin D deficiency We will refill Vit D 50,000 IU once a week for 1 month with 0 refills. Side effects discussed.   Low Vitamin D level contributes to fatigue and are associated with obesity, breast, and colon cancer. He agrees to continue to take prescription Vitamin D '@50'$ ,000 IU every week and will follow-up for routine testing of Vitamin D, at least 2-3 times per year to avoid over-replacement.   -Refill Vitamin D, Ergocalciferol, (DRISDOL) 1.25 MG (50000 UNIT) CAPS capsule; Take 1 capsule (50,000 Units total) by mouth every 7 (seven) days.  Dispense: 4 capsule; Refill: 0  2. Obesity, current BMI of 51.8 Brent Mccoy is currently in the action stage of change. As such, his goal is to continue with weight loss efforts. He has agreed to the Category 4 Plan.   Exercise goals: As is.  Behavioral modification strategies: increasing  lean protein intake, increasing water intake, and no skipping meals.  Brent Mccoy has agreed to follow-up with our clinic in 4 weeks. He was informed of the importance of frequent follow-up visits to maximize his success with intensive lifestyle modifications for his multiple health conditions.   Objective:   Blood pressure 123/79, pulse 87, temperature 98.2 F (36.8 C), height '6\' 3"'$  (1.905 m), weight (!) 414 lb (187.8 kg), SpO2 98 %. Body mass index is 51.75 kg/m.  General: Cooperative, alert, well developed, in no acute distress. HEENT: Conjunctivae and lids unremarkable. Cardiovascular: Regular rhythm.  Lungs: Normal work of breathing. Neurologic: No focal deficits.   Lab Results  Component Value Date   CREATININE 1.25 10/18/2021   BUN 18 10/18/2021   NA 137 10/18/2021   K 4.0 10/18/2021   CL 103 10/18/2021   CO2 27 10/18/2021   Lab Results  Component Value Date   ALT 18 03/29/2021   AST 19 03/29/2021   ALKPHOS 52 03/29/2021   BILITOT 0.7 03/29/2021   Lab Results  Component Value Date   HGBA1C 5.9 (H) 06/13/2021   Lab Results  Component Value Date   INSULIN 21.9 06/13/2021   Lab Results  Component Value Date   TSH 1.810 06/07/2021   Lab Results  Component Value Date   CHOL 141 10/18/2021   HDL 40.30 10/18/2021   LDLCALC 89 10/18/2021   TRIG 57.0 10/18/2021   CHOLHDL 3 10/18/2021   Lab Results  Component Value Date   VD25OH 30.41 10/18/2021   VD25OH 10.8 (L)  06/13/2021   Lab Results  Component Value Date   WBC 4.0 03/29/2021   HGB 14.5 03/29/2021   HCT 45.5 03/29/2021   MCV 90.2 03/29/2021   PLT 170.0 03/29/2021   No results found for: "IRON", "TIBC", "FERRITIN"  Attestation Statements:   Reviewed by clinician on day of visit: allergies, medications, problem list, medical history, surgical history, family history, social history, and previous encounter notes.  I, Brendell Tyus, RMA, am acting as transcriptionist for Everardo Pacific, FNP.  I  have reviewed the above documentation for accuracy and completeness, and I agree with the above. Everardo Pacific, FNP

## 2022-01-07 ENCOUNTER — Ambulatory Visit (INDEPENDENT_AMBULATORY_CARE_PROVIDER_SITE_OTHER): Payer: BC Managed Care – PPO | Admitting: Nurse Practitioner

## 2022-01-07 ENCOUNTER — Encounter (INDEPENDENT_AMBULATORY_CARE_PROVIDER_SITE_OTHER): Payer: Self-pay | Admitting: Nurse Practitioner

## 2022-01-07 VITALS — BP 118/79 | HR 94 | Temp 98.4°F | Ht 75.0 in | Wt >= 6400 oz

## 2022-01-07 DIAGNOSIS — Z6841 Body Mass Index (BMI) 40.0 and over, adult: Secondary | ICD-10-CM

## 2022-01-07 DIAGNOSIS — E559 Vitamin D deficiency, unspecified: Secondary | ICD-10-CM

## 2022-01-07 DIAGNOSIS — E669 Obesity, unspecified: Secondary | ICD-10-CM

## 2022-01-07 MED ORDER — VITAMIN D (ERGOCALCIFEROL) 1.25 MG (50000 UNIT) PO CAPS: 50000.0000 [IU] | ORAL_CAPSULE | ORAL | 0 refills | Status: DC

## 2022-01-09 NOTE — Progress Notes (Signed)
Chief Complaint:   OBESITY Brent Mccoy is here to discuss his progress with his obesity treatment plan along with follow-up of his obesity related diagnoses. Brent Mccoy is on the Category 4 Plan and states he is following his eating plan approximately 30% of the time. Brent Mccoy states he is riding a stationary bike 30 minutes 3 times per week.  Today's visit was #: 10 Starting weight: 446 lbs Starting date: 06/13/2021 Today's weight: 412 lbs Today's date: 01/07/2022 Total lbs lost to date: 34 lbs Total lbs lost since last in-office visit: 2  Interim History: Brent Mccoy is disappointed because he did not get the results he wanted. Plans to join [employer] sneakers. Celebrating his 2 year anniversary before his next visit. Struggling with knee pain 3 weeks ago. He plans to make an appointment with PCP for follow up labs.  Subjective:   1. Vitamin D deficiency Brent Mccoy is currently taking prescription Vit D 50,000 IU once a week. His last Vit D level of 30.41 on 10/18/21.  Assessment/Plan:   1. Vitamin D deficiency We will refill Vit D 50,000 IU once a week for 1 month with 0 refills.  Low Vitamin D level contributes to fatigue and are associated with obesity, breast, and colon cancer. He agrees to continue to take prescription Vitamin D '@50'$ ,000 IU every week and will follow-up for routine testing of Vitamin D, at least 2-3 times per year to avoid over-replacement.   -Refill Vitamin D, Ergocalciferol, (DRISDOL) 1.25 MG (50000 UNIT) CAPS capsule; Take 1 capsule (50,000 Units total) by mouth every 7 (seven) days.  Dispense: 4 capsule; Refill: 0  2. Obesity, current BMI of 51.5 Brent Mccoy is currently in the action stage of change. As such, his goal is to continue with weight loss efforts. He has agreed to the Category 4 Plan.   Exercise goals: As is.  Behavioral modification strategies: increasing lean protein intake, increasing vegetables, increasing water intake, no skipping meals, and planning for  success.  Brent Mccoy has agreed to follow-up with our clinic in 3 weeks. He was informed of the importance of frequent follow-up visits to maximize his success with intensive lifestyle modifications for his multiple health conditions.   Objective:   Blood pressure 118/79, pulse 94, temperature 98.4 F (36.9 C), height '6\' 3"'$  (1.905 m), weight (!) 414 lb (187.8 kg), SpO2 96 %. Body mass index is 51.75 kg/m.  General: Cooperative, alert, well developed, in no acute distress. HEENT: Conjunctivae and lids unremarkable. Cardiovascular: Regular rhythm.  Lungs: Normal work of breathing. Neurologic: No focal deficits.   Lab Results  Component Value Date   CREATININE 1.25 10/18/2021   BUN 18 10/18/2021   NA 137 10/18/2021   K 4.0 10/18/2021   CL 103 10/18/2021   CO2 27 10/18/2021   Lab Results  Component Value Date   ALT 18 03/29/2021   AST 19 03/29/2021   ALKPHOS 52 03/29/2021   BILITOT 0.7 03/29/2021   Lab Results  Component Value Date   HGBA1C 5.9 (H) 06/13/2021   Lab Results  Component Value Date   INSULIN 21.9 06/13/2021   Lab Results  Component Value Date   TSH 1.810 06/07/2021   Lab Results  Component Value Date   CHOL 141 10/18/2021   HDL 40.30 10/18/2021   LDLCALC 89 10/18/2021   TRIG 57.0 10/18/2021   CHOLHDL 3 10/18/2021   Lab Results  Component Value Date   VD25OH 30.41 10/18/2021   VD25OH 10.8 (L) 06/13/2021   Lab Results  Component Value Date   WBC 4.0 03/29/2021   HGB 14.5 03/29/2021   HCT 45.5 03/29/2021   MCV 90.2 03/29/2021   PLT 170.0 03/29/2021   No results found for: "IRON", "TIBC", "FERRITIN"  Attestation Statements:   Reviewed by clinician on day of visit: allergies, medications, problem list, medical history, surgical history, family history, social history, and previous encounter notes.  I, Brendell Tyus, RMA, am acting as transcriptionist for Everardo Pacific, FNP.  I have reviewed the above documentation for accuracy and  completeness, and I agree with the above. Everardo Pacific, FNP

## 2022-01-11 ENCOUNTER — Other Ambulatory Visit: Payer: Self-pay | Admitting: Family Medicine

## 2022-01-11 ENCOUNTER — Ambulatory Visit
Admission: RE | Admit: 2022-01-11 | Discharge: 2022-01-11 | Disposition: A | Discharge status: Home / Self Care | Payer: BC Managed Care – PPO | Source: Ambulatory Visit | Attending: Family Medicine | Admitting: Family Medicine

## 2022-01-11 DIAGNOSIS — M25561 Pain in right knee: Secondary | ICD-10-CM

## 2022-02-05 ENCOUNTER — Telehealth (INDEPENDENT_AMBULATORY_CARE_PROVIDER_SITE_OTHER): Payer: BC Managed Care – PPO | Admitting: Family Medicine

## 2022-02-05 ENCOUNTER — Encounter (INDEPENDENT_AMBULATORY_CARE_PROVIDER_SITE_OTHER): Payer: Self-pay | Admitting: Family Medicine

## 2022-02-05 VITALS — BP 125/78 | HR 84 | Temp 98.2°F | Ht 75.0 in | Wt >= 6400 oz

## 2022-02-05 DIAGNOSIS — E559 Vitamin D deficiency, unspecified: Secondary | ICD-10-CM | POA: Diagnosis not present

## 2022-02-05 DIAGNOSIS — M1711 Unilateral primary osteoarthritis, right knee: Secondary | ICD-10-CM

## 2022-02-05 DIAGNOSIS — R7303 Prediabetes: Secondary | ICD-10-CM | POA: Diagnosis not present

## 2022-02-05 DIAGNOSIS — E669 Obesity, unspecified: Secondary | ICD-10-CM

## 2022-02-05 DIAGNOSIS — M1612 Unilateral primary osteoarthritis, left hip: Secondary | ICD-10-CM | POA: Diagnosis not present

## 2022-02-05 DIAGNOSIS — Z6841 Body Mass Index (BMI) 40.0 and over, adult: Secondary | ICD-10-CM

## 2022-02-05 MED ORDER — VITAMIN D (ERGOCALCIFEROL) 1.25 MG (50000 UNIT) PO CAPS: 50000.0000 [IU] | ORAL_CAPSULE | ORAL | 0 refills | Status: DC

## 2022-02-08 ENCOUNTER — Telehealth: Payer: Self-pay

## 2022-02-08 NOTE — Telephone Encounter (Signed)
VMT pt requesting call back to discuss referral to PREP 

## 2022-02-13 NOTE — Progress Notes (Signed)
Chief Complaint:   OBESITY Brent Mccoy is here to discuss his progress with his obesity treatment plan along with follow-up of his obesity related diagnoses. Brent Mccoy is on the Category 4 Plan and states he is following his eating plan approximately 80% of the time. Brent Mccoy states he is doing pedal exercises   20-30 minutes 2-3 times per week.  Today's visit was #: 11 Starting weight: 446 lbs Starting date: 06/13/2021 Today's weight: 414 lbs Today's date: 02/05/2022 Total lbs lost to date: 32 lbs Total lbs lost since last in-office visit: 0  Interim History: He had a death in the family and had to travel to [place].  He admits to getting off track with travel.  He was doing better with measuring out meats.  He eats a late breakfast, fruit in the afternoon, 2 veggies with dinner and protein with dinner,.  He is choosing 100 calorie popcorn, yogurt, popsicle at night.    Subjective:   1. Osteoarthritis of right knee, unspecified osteoarthritis type Using walking stick and wants to get in water exercise.  Follow up with the orthopedist.  2. Osteoarthritis of left hip, unspecified osteoarthritis type S/P total hip replacement, but still has some hip pain that limits exercise.   3. Vitamin D deficiency He is currently taking prescription vitamin D 50,000 IU each week. He denies nausea, vomiting or muscle weakness.  Last Vitamin D level 10/18/21, was 30.4.  4. Prediabetes Last A1c 5.9 05/2020.  Has lost 34 lbs since then.   Assessment/Plan:   1. Osteoarthritis of right knee, unspecified osteoarthritis type Referral to PREP program.  2. Osteoarthritis of left hip, unspecified osteoarthritis type Sees Dr Mayer Camel, actively working on weight loss.  Referral to PREP program made.   3. Vitamin D deficiency Recheck Vitamin D level today.   Refill - Vitamin D, Ergocalciferol, (DRISDOL) 1.25 MG (50000 UNIT) CAPS capsule; Take 1 capsule (50,000 Units total) by mouth every 7 (seven) days.   Dispense: 4 capsule; Refill: 0  - Vitamin D, 25-Hydroxy, Total  4. Prediabetes Recheck A1c today.   - Hemoglobin A1c  5. Obesity,current BMI 51.8 1) Reviewed Bio[employer], retaining a little more fluid today.  2) Starting TaiChi exercises.   Referral - Amb Referral To Provider Referral Exercise Program (P.R.E.P)  Brent Mccoy is currently in the action stage of change. As such, his goal is to continue with weight loss efforts. He has agreed to the Category 4 Plan.   Exercise goals:  Referral to PREP program.   Behavioral modification strategies: increasing lean protein intake, increasing vegetables, increasing water intake, decreasing eating out, no skipping meals, meal planning and cooking strategies, and decreasing junk food.  Brent Mccoy has agreed to follow-up with our clinic in 4 weeks. He was informed of the importance of frequent follow-up visits to maximize his success with intensive lifestyle modifications for his multiple health conditions.   Brent Mccoy was informed we would discuss his lab results at his next visit unless there is a critical issue that needs to be addressed sooner. [name] agreed to keep his next visit at the agreed upon time to discuss these results.  Objective:   Blood pressure 125/78, pulse 84, temperature 98.2 F (36.8 C), height '6\' 3"'$  (1.905 m), weight (!) 414 lb (187.8 kg), SpO2 97 %. Body mass index is 51.75 kg/m.  General: Cooperative, alert, well developed, in no acute distress. HEENT: Conjunctivae and lids unremarkable. Cardiovascular: Regular rhythm.  Lungs: Normal work of breathing. Neurologic: No focal deficits.  Lab Results  Component Value Date   CREATININE 1.25 10/18/2021   BUN 18 10/18/2021   NA 137 10/18/2021   K 4.0 10/18/2021   CL 103 10/18/2021   CO2 27 10/18/2021   Lab Results  Component Value Date   ALT 18 03/29/2021   AST 19 03/29/2021   ALKPHOS 52 03/29/2021   BILITOT 0.7 03/29/2021   Lab Results  Component Value Date    HGBA1C 5.9 (H) 06/13/2021   Lab Results  Component Value Date   INSULIN 21.9 06/13/2021   Lab Results  Component Value Date   TSH 1.810 06/07/2021   Lab Results  Component Value Date   CHOL 141 10/18/2021   HDL 40.30 10/18/2021   LDLCALC 89 10/18/2021   TRIG 57.0 10/18/2021   CHOLHDL 3 10/18/2021   Lab Results  Component Value Date   VD25OH 30.41 10/18/2021   VD25OH 10.8 (L) 06/13/2021   Lab Results  Component Value Date   WBC 4.0 03/29/2021   HGB 14.5 03/29/2021   HCT 45.5 03/29/2021   MCV 90.2 03/29/2021   PLT 170.0 03/29/2021   No results found for: "IRON", "TIBC", "FERRITIN"  Attestation Statements:   Reviewed by clinician on day of visit: allergies, medications, problem list, medical history, surgical history, family history, social history, and previous encounter notes.  I, Davy Pique, am acting as Location manager for Loyal Gambler, DO.  I have reviewed the above documentation for accuracy and completeness, and I agree with the above. Dell Ponto, DO

## 2022-02-14 LAB — VITAMIN D, 25-HYDROXY, TOTAL: Vitamin D, 25-Hydroxy, Serum: 36 ng/mL

## 2022-03-06 ENCOUNTER — Encounter (INDEPENDENT_AMBULATORY_CARE_PROVIDER_SITE_OTHER): Payer: Self-pay | Admitting: Family Medicine

## 2022-03-06 ENCOUNTER — Ambulatory Visit (INDEPENDENT_AMBULATORY_CARE_PROVIDER_SITE_OTHER): Payer: BC Managed Care – PPO | Admitting: Family Medicine

## 2022-03-06 VITALS — BP 110/75 | HR 99 | Temp 98.3°F | Ht 75.0 in | Wt >= 6400 oz

## 2022-03-06 DIAGNOSIS — R7303 Prediabetes: Secondary | ICD-10-CM | POA: Diagnosis not present

## 2022-03-06 DIAGNOSIS — Z6841 Body Mass Index (BMI) 40.0 and over, adult: Secondary | ICD-10-CM

## 2022-03-06 DIAGNOSIS — E559 Vitamin D deficiency, unspecified: Secondary | ICD-10-CM | POA: Diagnosis not present

## 2022-03-06 DIAGNOSIS — M170 Bilateral primary osteoarthritis of knee: Secondary | ICD-10-CM

## 2022-03-06 DIAGNOSIS — E669 Obesity, unspecified: Secondary | ICD-10-CM

## 2022-03-06 MED ORDER — VITAMIN D (ERGOCALCIFEROL) 1.25 MG (50000 UNIT) PO CAPS: 50000.0000 [IU] | ORAL_CAPSULE | ORAL | 0 refills | Status: DC

## 2022-03-19 NOTE — Progress Notes (Signed)
Chief Complaint:   OBESITY Brent Mccoy is here to discuss his progress with his obesity treatment plan along with follow-up of his obesity related diagnoses. [name] is on the Category 4 Plan and states he is following his eating plan approximately 75% of the time. Faolan states he is walking and doing pedal exercises 15 minutes 3 times per week.  Today's visit was #: 12 Starting weight: 446 lbs Starting date: 06/13/2021 Today's weight: 408 lbs Today's date: 03/06/2022 Total lbs lost to date: 38 lbs Total lbs lost since last in-office visit: 6 lbs  Interim History: He has been eating out less often.  He has been eating on meal plan most of the time.  Walking 15 minutes 3-4 times per week.  Eating 2 meals, 2 snacks, denies hunger or cravings.  Eating more PB crackers.  Increased veggies.  Knee pain limits exercise.  Subjective:   1. Vitamin D deficiency Discussed labs with patient today. He is currently taking prescription vitamin D 50,000 IU each week. He denies nausea, vomiting or muscle weakness. Vitamin D level 02/05/22, was 36.  2. Osteoarthritis of both knees, unspecified osteoarthritis type He is using a walking stick.  He is taking Tylenol prn.  He is actively working on weight loss, he is donw 38 lbs in 1.5 years.  3. Pre-diabetes Last A1c 5.9 06/13/2021.  He is limiting intake of sweets.  He is not on any medications.    Assessment/Plan:   1. Vitamin D deficiency Refill - Vitamin D, Ergocalciferol, (DRISDOL) 1.25 MG (50000 UNIT) CAPS capsule; Take 1 capsule (50,000 Units total) by mouth every 7 (seven) days.  Dispense: 5 capsule; Refill: 0  2. Osteoarthritis of both knees, unspecified osteoarthritis type Continue active plan for weight loss, consider water exercise.   3. Pre-diabetes Recheck A1c with next labs.  Consider use of metformin.   4. Obesity, current BMI 51.0 Plans to increase cycling at home to 10 minutes 2 times per day.   Jarrah is currently in the  action stage of change. As such, his goal is to continue with weight loss efforts. He has agreed to the Category 4 Plan.   Exercise goals:  Plan to add in resistance training at home.  Behavioral modification strategies: increasing lean protein intake, increasing vegetables, increasing water intake, decreasing eating out, keeping healthy foods in the home, better snacking choices, holiday eating strategies , and celebration eating strategies.  Keiton has agreed to follow-up with our clinic in 3-4 weeks. He was informed of the importance of frequent follow-up visits to maximize his success with intensive lifestyle modifications for his multiple health conditions.   Objective:   Blood pressure 110/75, pulse 99, temperature 98.3 F (36.8 C), height '6\' 3"'$  (1.905 m), weight (!) 408 lb (185.1 kg), SpO2 98 %. Body mass index is 51 kg/m.  General: Cooperative, alert, well developed, in no acute distress. HEENT: Conjunctivae and lids unremarkable. Cardiovascular: Regular rhythm.  Lungs: Normal work of breathing. Neurologic: No focal deficits.   Lab Results  Component Value Date   CREATININE 1.25 10/18/2021   BUN 18 10/18/2021   NA 137 10/18/2021   K 4.0 10/18/2021   CL 103 10/18/2021   CO2 27 10/18/2021   Lab Results  Component Value Date   ALT 18 03/29/2021   AST 19 03/29/2021   ALKPHOS 52 03/29/2021   BILITOT 0.7 03/29/2021   Lab Results  Component Value Date   HGBA1C 5.9 (H) 06/13/2021   Lab Results  Component  Value Date   INSULIN 21.9 06/13/2021   Lab Results  Component Value Date   TSH 1.810 06/07/2021   Lab Results  Component Value Date   CHOL 141 10/18/2021   HDL 40.30 10/18/2021   LDLCALC 89 10/18/2021   TRIG 57.0 10/18/2021   CHOLHDL 3 10/18/2021   Lab Results  Component Value Date   VD25OH 30.41 10/18/2021   VD25OH 10.8 (L) 06/13/2021   Lab Results  Component Value Date   WBC 4.0 03/29/2021   HGB 14.5 03/29/2021   HCT 45.5 03/29/2021   MCV 90.2  03/29/2021   PLT 170.0 03/29/2021   No results found for: "IRON", "TIBC", "FERRITIN"  Attestation Statements:   Reviewed by clinician on day of visit: allergies, medications, problem list, medical history, surgical history, family history, social history, and previous encounter notes.  I, Davy Pique, am acting as Location manager for Loyal Gambler, DO.  I have reviewed the above documentation for accuracy and completeness, and I agree with the above. Dell Ponto, DO

## 2022-03-29 ENCOUNTER — Ambulatory Visit (INDEPENDENT_AMBULATORY_CARE_PROVIDER_SITE_OTHER): Payer: BC Managed Care – PPO | Admitting: Internal Medicine

## 2022-03-29 DIAGNOSIS — D869 Sarcoidosis, unspecified: Secondary | ICD-10-CM

## 2022-03-29 LAB — PULMONARY FUNCTION TEST
DL/VA % pred: 137 %
DL/VA: 5.63 ml/min/mmHg/L
DLCO cor % pred: 103 %
DLCO cor: 32.43 ml/min/mmHg
DLCO unc % pred: 103 %
DLCO unc: 32.43 ml/min/mmHg
FEF 25-75 Post: 5.65 L/sec
FEF 25-75 Pre: 5.06 L/sec
FEF2575-%Change-Post: 11 %
FEF2575-%Pred-Post: 170 %
FEF2575-%Pred-Pre: 152 %
FEV1-%Change-Post: 2 %
FEV1-%Pred-Post: 89 %
FEV1-%Pred-Pre: 87 %
FEV1-Post: 3.73 L
FEV1-Pre: 3.64 L
FEV1FVC-%Change-Post: 4 %
FEV1FVC-%Pred-Pre: 113 %
FEV6-%Change-Post: -1 %
FEV6-%Pred-Post: 79 %
FEV6-%Pred-Pre: 80 %
FEV6-Post: 4.2 L
FEV6-Pre: 4.28 L
FEV6FVC-%Change-Post: 0 %
FEV6FVC-%Pred-Post: 104 %
FEV6FVC-%Pred-Pre: 104 %
FVC-%Change-Post: -1 %
FVC-%Pred-Post: 75 %
FVC-%Pred-Pre: 77 %
FVC-Post: 4.21 L
FVC-Pre: 4.29 L
Post FEV1/FVC ratio: 88 %
Post FEV6/FVC ratio: 100 %
Pre FEV1/FVC ratio: 85 %
Pre FEV6/FVC Ratio: 100 %

## 2022-03-29 NOTE — Patient Instructions (Signed)
Full PFT performed today. °

## 2022-03-29 NOTE — Progress Notes (Signed)
Full PFT performed today. °

## 2022-04-03 ENCOUNTER — Telehealth: Payer: Self-pay | Admitting: Internal Medicine

## 2022-04-03 NOTE — Telephone Encounter (Signed)
Pulmonary Function Tests- the airflow speed was normal. Lung volume couldn't be measured accurately.  The exchange of oxygen and carbon dioxide measured as the Diffusion test was a little increased. This can sometimes be seen with heart problems like fluid overload.

## 2022-04-03 NOTE — Telephone Encounter (Signed)
Mychart message sent.

## 2022-04-03 NOTE — Telephone Encounter (Signed)
Called patient and he states that he would like the results of his breathing tests.   Please advise sir

## 2022-04-04 ENCOUNTER — Ambulatory Visit (INDEPENDENT_AMBULATORY_CARE_PROVIDER_SITE_OTHER): Payer: BC Managed Care – PPO | Admitting: Family Medicine

## 2022-04-09 ENCOUNTER — Telehealth: Payer: Self-pay | Admitting: Internal Medicine

## 2022-04-09 NOTE — Telephone Encounter (Signed)
PT got my chart results. (See last telephone encounter) and still needs explanation. He said he responded w/Questions on Mychart but has not seen any replies. It was on 12/16 His # is [phone] He prefers an in person call.

## 2022-04-10 NOTE — Telephone Encounter (Signed)
Called patient and went over the better explanation of the pulmonary function test results. Went over his concerns with his diagnosis. Patient had concerns about his weight and his breathing and I addressed that with Dr Annamaria Boots on the phone with the patient. Nothing further needed

## 2022-04-10 NOTE — Telephone Encounter (Signed)
The pulmonary function scores really look pretty good. I need to go back and correct a typo. Basically- air flows through the airways well. The volume (size of the lungs) is borderline low for your size mostly because you are too heavy. Oxygen exchange is actually a bit better than predicted. Sometimes this can be caused by heart issues, but I don't think if means much for you.

## 2022-04-10 NOTE — Telephone Encounter (Signed)
Dr Annamaria Boots,  Patient is asking for better detail on his pulmonary function test. He is asking what he needs to be concerned about for now and in the future. He states he is having a hard time understanding your results from the PFT  Please advise

## 2022-04-27 NOTE — Progress Notes (Signed)
HPI M never smoker followed for Sarcoid, Insomnia, OSA/CPAP,  complicated by HBP, allergic rhinitis, GERD, LeftDVT 2019, PE 2020/ Eliquis, Obesity, CKD3a,  NPSG [facility] 05/04/14 AHI 61.2/ hr, desaturation to 81%, CPAP to 11, body weight 422 lbs ACE 04/23/16- 68 ( 9-67) Lab- ACE level  53 on 10/18/21, 71H on 04/25/21, 85H on 03/29/21, 68 H on 04/23/16 CBC with differential 04/23/2016-WNL PFT 03/29/22- possible restriction, Lung volumes not directly measured. --------------------------------------------------------------------------------------------   10/18/21- 62 year old M never smoker followed for Sarcoid, Insomnia, OSA/CPAP,  complicated  by HBP, allergic rhinitis, GERD, Left DVT 2019, PE 2020/ Eliquis, Obesity, CKD3a, SVT, COVID infection May 2022, -Ambien 10,  CPAP auto12-16 /[facility] Download compliance -  Body weight today-425 lbs Covid vax-5 Phizer Flu vax-had He is in a weight loss program now reporting 39 pound weight loss with a goal of losing 150 pounds.  Strongly encouraged. Dyspnea comes and goes, at least partly related to exertion.  He denies wheeze or cough.  We are going to reorder PFT. He is concerned and wanting reassurance that sarcoid is not active-discussed. Cardiac PET 08/07/21- no ischemia, EF 50%, no CAD IMPRESSION: 1. Small amount of pericardial fluid without overt effusion. 2. No mediastinal or hilar mass or adenopathy. 3. No significant pulmonary findings. Aortic Atherosclerosis (ICD10-I70.0).  04/29/22- 62 year old M never smoker followed for Sarcoid, Insomnia, OSA/CPAP,  complicated  by HBP, allergic rhinitis, GERD, Left DVT 2019, PE 2020/ Eliquis, Obesity, CKD3a, SVT, COVID infection May 2022, -Ambien 10, Ventolin hfa, CPAP auto12-16 /[facility]    [facility] G3 Download compliance -  Body weight today-423 lbs Covid vax-5 Phizer Flu vax-had  ACE 53 on 10/18/21 PFT 03/29/22- possible restriction, Lung volumes not directly measured. Using a Luna CPAP machine.  He  has no SD card and no download capability.  He reports using routinely and sleeping better with CPAP. Dyspnea on exertion especially bending over.  He has not been able to get his weight down.  He talks about getting back to swimming. He asks about RSV vaccine.  ROV-see HPI    + = positive Constitutional:   No-   weight loss, night sweats, fevers, chills, +fatigue, lassitude. HEENT:   No-  headaches, difficulty swallowing, tooth/dental problems, sore throat,       No-  sneezing, itching, ear ache, nasal congestion, post nasal drip,  CV:  No-   chest pain, orthopnea, PND, swelling in lower extremities, anasarca, dizziness, palpitations Resp: + shortness of breath with exertion or at rest.              No-   productive cough,   non-productive cough,  No- coughing up of blood.              No-   change in color of mucus.  No- wheezing.   Skin: No-   rash or lesions. GI:  No-   heartburn, indigestion, abdominal pain, nausea, vomiting, diarrhea,                 change in bowel habits, loss of appetite GU: No-   dysuria, change in color of urine, no urgency or frequency.  No- flank pain. MS:  + joint pain or swelling.  No- decreased range of motion.  No- back pain. Neuro-     nothing unusual Psych:  No- change in mood or affect. No depression or anxiety. + memory loss.  OBJ General- Alert, Oriented, Affect-appropriate, Distress- none acute, +morbidly obese/ big, laconic Skin-  lesions- none, excoriation- none,  Lymphadenopathy- none Head- atraumatic            Eyes- Gross vision intact, PERRLA, conjunctivae clear secretions            Ears- Hearing, canals-normal            Nose- Clear, no-Septal dev, mucus, polyps, erosion, perforation             Throat- Mallampati II-III , mucosa clear , drainage- none, tonsils- atrophic Neck- flexible , trachea midline, no stridor , thyroid nl, carotid no bruit Chest - symmetrical excursion , unlabored           Heart/CV- RRR , no murmur , no gallop  ,  no rub, nl s1 s2                           - JVD- none , edema- none, stasis changes- none, varices- none           Lung- clear to P&A, wheeze- none, cough- none , dullness-none, rub- none           Chest wall-  Abd-  Br/ Gen/ Rectal- Not done, not indicated Extrem- cyanosis- none, clubbing, none, atrophy- none, strength- nl,  + cane Neuro- grossly intact to observation

## 2022-04-29 ENCOUNTER — Encounter: Payer: Self-pay | Admitting: Internal Medicine

## 2022-04-29 ENCOUNTER — Ambulatory Visit (INDEPENDENT_AMBULATORY_CARE_PROVIDER_SITE_OTHER): Payer: BC Managed Care – PPO | Admitting: Internal Medicine

## 2022-04-29 ENCOUNTER — Ambulatory Visit (INDEPENDENT_AMBULATORY_CARE_PROVIDER_SITE_OTHER): Payer: BC Managed Care – PPO | Admitting: Family Medicine

## 2022-04-29 VITALS — BP 126/78 | HR 87 | Temp 98.1°F | Ht 75.0 in | Wt >= 6400 oz

## 2022-04-29 DIAGNOSIS — D869 Sarcoidosis, unspecified: Secondary | ICD-10-CM

## 2022-04-29 DIAGNOSIS — R0602 Shortness of breath: Secondary | ICD-10-CM

## 2022-04-29 DIAGNOSIS — G4733 Obstructive sleep apnea (adult) (pediatric): Secondary | ICD-10-CM

## 2022-04-29 NOTE — Patient Instructions (Signed)
Order- DME [facility]- please install SD  card or download soft ware to provide download. Meanwhile please continue auto 12-16.  Order- RSV vaccine

## 2022-05-29 NOTE — Assessment & Plan Note (Signed)
Encouraged to keep working on this.  If he is able to go back to swimming then that is an excellent opportunity.

## 2022-05-29 NOTE — Assessment & Plan Note (Signed)
From CPAP.  Needs download. Plan-continue auto 12-16.  DME to provide download, SD card

## 2022-05-29 NOTE — Assessment & Plan Note (Signed)
-  year-old with significant after being his obesity and deconditioning.  Importance of any cardiovascular disease is not established.

## 2022-06-11 ENCOUNTER — Encounter (INDEPENDENT_AMBULATORY_CARE_PROVIDER_SITE_OTHER): Payer: Self-pay | Admitting: Family Medicine

## 2022-06-11 ENCOUNTER — Ambulatory Visit (INDEPENDENT_AMBULATORY_CARE_PROVIDER_SITE_OTHER): Payer: BC Managed Care – PPO | Admitting: Family Medicine

## 2022-06-11 VITALS — BP 135/83 | HR 85 | Temp 98.3°F | Ht 75.0 in | Wt >= 6400 oz

## 2022-06-11 DIAGNOSIS — E559 Vitamin D deficiency, unspecified: Secondary | ICD-10-CM

## 2022-06-11 DIAGNOSIS — R7303 Prediabetes: Secondary | ICD-10-CM | POA: Diagnosis not present

## 2022-06-11 DIAGNOSIS — M170 Bilateral primary osteoarthritis of knee: Secondary | ICD-10-CM | POA: Diagnosis not present

## 2022-06-11 DIAGNOSIS — Z6841 Body Mass Index (BMI) 40.0 and over, adult: Secondary | ICD-10-CM

## 2022-06-11 MED ORDER — VITAMIN D (ERGOCALCIFEROL) 1.25 MG (50000 UNIT) PO CAPS: 50000.0000 [IU] | ORAL_CAPSULE | ORAL | 0 refills | Status: DC

## 2022-06-11 NOTE — Assessment & Plan Note (Signed)
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  He is using a walking stick.  We discussed water exercise, weight training or use of an exercise bike and seeking out local gym options nearby.  We set a goal for 30 minutes or more of exercise at least 3 days a week.  Follow-up with orthopedics.

## 2022-06-11 NOTE — Assessment & Plan Note (Signed)
Last vitamin D Lab Results  Component Value Date   VD25OH 30.41 10/18/2021    Has run out of prescription vitamin D 50,000 IU once weekly.  Will recheck vitamin D level today and refill vitamin D 50,000 IU once weekly, number 5 capsules no refills.  Energy level has been poor.

## 2022-06-11 NOTE — Progress Notes (Signed)
Office: [phone]  /  Fax: [phone]  WEIGHT SUMMARY AND BIOMETRICS  Medical Weight Loss Height: 6' 3"$  (1.905 m) Weight: 419 lb (190.1 kg) Temp: 98.3 F (36.8 C) Pulse Rate: 85 BP: 135/83 SpO2: 96 % Fasting: no Labs: no Today's Visit #: 13 Weight at Last VIsit: 408lb Weight Lost Since Last Visit: +11  Body Fat %: 46.5 % Fat Mass (lbs): 195 lbs Muscle Mass (lbs): 213.6 lbs Total Body Water (lbs): 178.2 lbs Visceral Fat Rating : 37 Starting Date: 06/13/21 Starting Weight: 446 Total Weight Loss (lbs): 49 lb (22.2 kg)    HPI  Chief Complaint: OBESITY  Brent Mccoy is here to discuss his progress with his obesity treatment plan. He is on the the Category 4 Plan and states he is following his eating plan approximately 15 % of the time. He states he is not exercising.    Interval History:  Since last office visit he is  up 11 lb since his last visit. His net weight loss of 49 lb in the pat year. He would like to get to 350 lb. Hasn't been making healthy food choices.  He has not been here for 3 mos and admits to getting off track with the holidays.  Consuming more sweets.    Wishes to restart meal plan Copy of category 4 meal plan and 100 calorie snack list given Has not been working out Had L hip replacement surgery- uses a walking stick R knee DJD impairs his walking  Pharmacotherapy: none  PHYSICAL EXAM:  Blood pressure 135/83, pulse 85, temperature 98.3 F (36.8 C), height 6' 3"$  (1.905 m), weight (!) 419 lb (190.1 kg), SpO2 96 %. Body mass index is 52.37 kg/m.  General: He is overweight, cooperative, alert, well developed, and in no acute distress. PSYCH: Has normal mood, affect and thought process.   HEENT: EOMI, sclerae are anicteric. Lungs: Normal breathing effort, no conversational dyspnea. Extremities: No edema.  Neurologic: No gross sensory or motor deficits. No tremors or fasciculations noted.    DIAGNOSTIC DATA REVIEWED:  BMET    Component  Value Date/Time   NA 137 10/18/2021 1208   K 4.0 10/18/2021 1208   CL 103 10/18/2021 1208   CO2 27 10/18/2021 1208   GLUCOSE 100 (H) 10/18/2021 1208   BUN 18 10/18/2021 1208   CREATININE 1.25 10/18/2021 1208   CALCIUM 9.2 10/18/2021 1208   GFRNONAA 48 (L) 08/22/2019 0359   GFRAA 55 (L) 08/22/2019 0359   Lab Results  Component Value Date   HGBA1C 5.9 (H) 06/13/2021   Lab Results  Component Value Date   INSULIN 21.9 06/13/2021   Lab Results  Component Value Date   TSH 1.810 06/07/2021   CBC    Component Value Date/Time   WBC 4.0 03/29/2021 1030   RBC 5.04 03/29/2021 1030   HGB 14.5 03/29/2021 1030   HCT 45.5 03/29/2021 1030   PLT 170.0 03/29/2021 1030   MCV 90.2 03/29/2021 1030   MCH 28.8 08/22/2019 0359   MCHC 31.9 03/29/2021 1030   RDW 15.0 03/29/2021 1030   Iron Studies No results found for: "IRON", "TIBC", "FERRITIN", "IRONPCTSAT" Lipid Panel     Component Value Date/Time   CHOL 141 10/18/2021 1208   CHOL 169 06/13/2021 0846   TRIG 57.0 10/18/2021 1208   HDL 40.30 10/18/2021 1208   HDL 37 (L) 06/13/2021 0846   CHOLHDL 3 10/18/2021 1208   VLDL 11.4 10/18/2021 1208   LDLCALC 89 10/18/2021 1208   LDLCALC 116 (  H) 06/13/2021 0846   Hepatic Function Panel     Component Value Date/Time   PROT 7.8 03/29/2021 1030   ALBUMIN 3.9 03/29/2021 1030   AST 19 03/29/2021 1030   ALT 18 03/29/2021 1030   ALKPHOS 52 03/29/2021 1030   BILITOT 0.7 03/29/2021 1030   BILIDIR 0.2 01/12/2007 1051      Component Value Date/Time   TSH 1.810 06/07/2021 1410   Nutritional Lab Results  Component Value Date   VD25OH 30.41 10/18/2021   VD25OH 10.8 (L) 06/13/2021     ASSESSMENT AND PLAN  TREATMENT PLAN FOR OBESITY:  Recommended Dietary Goals  Seiya is currently in the action stage of change. As such, his goal is to continue weight management plan. He has agreed to the Category 4 Plan.  Behavioral Intervention  We discussed the following Behavioral Modification  Strategies today: increasing lean protein intake, increasing vegetables, avoid skipping meals, increase water intake, work on meal planning and easy cooking plans, and think about ways to increase physical activity.  Additional resources provided today: NA  Recommended Physical Activity Goals  Frederic has been advised to work up to 150 minutes of moderate intensity aerobic activity a week and strengthening exercises 2-3 times per week for cardiovascular health, weight loss maintenance and preservation of muscle mass.   He has agreed to increase physical activity in their day and reduce sedentary time (increase NEAT).  Add gym workouts or exercise bike 30 minutes 3 more days a week   Pharmacotherapy We discussed various medication options to help Price with his weight loss efforts and we both agreed to none.  ASSOCIATED CONDITIONS ADDRESSED TODAY  Obesity,current BMI 52.37  Vitamin D deficiency Assessment & Plan: Last vitamin D Lab Results  Component Value Date   VD25OH 30.41 10/18/2021    Has run out of prescription vitamin D 50,000 IU once weekly.  Will recheck vitamin D level today and refill vitamin D 50,000 IU once weekly, number 5 capsules no refills.  Energy level has been poor.  Orders: -     Vitamin D (Ergocalciferol); Take 1 capsule (50,000 Units total) by mouth every 7 (seven) days.  Dispense: 5 capsule; Refill: 0 -     VITAMIN D 25 Hydroxy (Vit-D Deficiency, Fractures)  Pre-diabetes Assessment & Plan: Lab Results  Component Value Date   HGBA1C 5.9 (H) 06/13/2021    Recheck A1c today.  He has been consuming more refined carbohydrates and added sugar over the past 3 months.  His weight loss has plateaued.  He does have a net weight loss of 27 pounds over the past year.  Consider use of metformin.  Orders: -     Hemoglobin A1c -     Comprehensive metabolic panel  Morbid obesity (HCC)  Osteoarthritis of both knees, unspecified osteoarthritis type Assessment &  Plan: DJD of the hips and knees have impaired ambulation.  He is using a walking stick.  We discussed water exercise, weight training or use of an exercise bike and seeking out local gym options nearby.  We set a goal for 30 minutes or more of exercise at least 3 days a week.  Follow-up with orthopedics.       No follow-ups on file.Marland Kitchen He was informed of the importance of frequent follow up visits to maximize his success with intensive lifestyle modifications for his multiple health conditions.   ATTESTASTION STATEMENTS:  Reviewed by clinician on day of visit: allergies, medications, problem list, medical history, surgical history, family history, social  history, and previous encounter notes.   I have personally spent 30 minutes total time today in preparation, patient care, nutritional counseling and documentation for this visit, including the following: review of clinical lab tests; review of medical tests/procedures/services.      Dell Ponto, DO

## 2022-06-11 NOTE — Assessment & Plan Note (Signed)
Lab Results  Component Value Date   HGBA1C 5.9 (H) 06/13/2021    Recheck A1c today.  He has been consuming more refined carbohydrates and added sugar over the past 3 months.  His weight loss has plateaued.  He does have a net weight loss of 27 pounds over the past year.  Consider use of metformin.

## 2022-06-12 LAB — COMPREHENSIVE METABOLIC PANEL
ALT: 11 IU/L (ref 0–44)
AST: 16 IU/L (ref 0–40)
Albumin/Globulin Ratio: 1.2 (ref 1.2–2.2)
Albumin: 3.9 g/dL (ref 3.9–4.9)
Alkaline Phosphatase: 59 IU/L (ref 44–121)
BUN/Creatinine Ratio: 14 (ref 10–24)
BUN: 17 mg/dL (ref 8–27)
Bilirubin Total: 0.5 mg/dL (ref 0.0–1.2)
CO2: 24 mmol/L (ref 20–29)
Calcium: 9 mg/dL (ref 8.6–10.2)
Chloride: 104 mmol/L (ref 96–106)
Creatinine, Ser: 1.2 mg/dL (ref 0.76–1.27)
Globulin, Total: 3.2 g/dL (ref 1.5–4.5)
Glucose: 103 mg/dL — ABNORMAL HIGH (ref 70–99)
Potassium: 4.2 mmol/L (ref 3.5–5.2)
Sodium: 142 mmol/L (ref 134–144)
Total Protein: 7.1 g/dL (ref 6.0–8.5)
eGFR: 68 mL/min/{1.73_m2} (ref >59)

## 2022-06-12 LAB — VITAMIN D 25 HYDROXY (VIT D DEFICIENCY, FRACTURES): Vit D, 25-Hydroxy: 28.4 ng/mL — ABNORMAL LOW (ref 30.0–100.0)

## 2022-06-12 LAB — HEMOGLOBIN A1C
Est. average glucose Bld gHb Est-mCnc: 117 mg/dL
Hgb A1c MFr Bld: 5.7 % — ABNORMAL HIGH (ref 4.8–5.6)

## 2022-07-03 ENCOUNTER — Other Ambulatory Visit (INDEPENDENT_AMBULATORY_CARE_PROVIDER_SITE_OTHER): Payer: Self-pay | Admitting: Family Medicine

## 2022-07-03 DIAGNOSIS — E559 Vitamin D deficiency, unspecified: Secondary | ICD-10-CM

## 2022-07-15 ENCOUNTER — Encounter (INDEPENDENT_AMBULATORY_CARE_PROVIDER_SITE_OTHER): Payer: Self-pay | Admitting: Family Medicine

## 2022-07-15 ENCOUNTER — Ambulatory Visit (INDEPENDENT_AMBULATORY_CARE_PROVIDER_SITE_OTHER): Payer: BC Managed Care – PPO | Admitting: Family Medicine

## 2022-07-15 VITALS — BP 128/79 | HR 89 | Temp 97.5°F | Ht 75.0 in | Wt >= 6400 oz

## 2022-07-15 DIAGNOSIS — E559 Vitamin D deficiency, unspecified: Secondary | ICD-10-CM

## 2022-07-15 DIAGNOSIS — R7303 Prediabetes: Secondary | ICD-10-CM | POA: Diagnosis not present

## 2022-07-15 DIAGNOSIS — M170 Bilateral primary osteoarthritis of knee: Secondary | ICD-10-CM

## 2022-07-15 DIAGNOSIS — Z6841 Body Mass Index (BMI) 40.0 and over, adult: Secondary | ICD-10-CM

## 2022-07-15 MED ORDER — VITAMIN D (ERGOCALCIFEROL) 1.25 MG (50000 UNIT) PO CAPS: 50000.0000 [IU] | ORAL_CAPSULE | ORAL | 0 refills | Status: DC

## 2022-07-15 NOTE — Assessment & Plan Note (Signed)
Lab Results  Component Value Date   HGBA1C 5.7 (H) 06/11/2022   Doing well on prescribed meal plan, reducing intake of starches and sweets.  Has added in limited exercise.  Plan: continue prescribed meal plan.  Recheck A1c in 3 mos. Consider use of metformin.

## 2022-07-15 NOTE — Assessment & Plan Note (Signed)
Last vitamin D Lab Results  Component Value Date   VD25OH 28.4 (L) 06/11/2022   Taking RX vitamin D 50,000 IU weekly.  Energy level is improving.  Denies GI side effects.  We discussed a goal vitamin D level of 50-70.  Continue/ RF RX vitamin D 50,000 IU weekly.  Recheck level in 3 mos.

## 2022-07-15 NOTE — Progress Notes (Signed)
Office: [phone]  /  Fax: [phone]  [place]  Starting Date: 06/13/21  Starting Weight: 446lb   Weight Lost Since Last Visit: 13lb   Vitals Temp: (!) 97.5 F (36.4 C) BP: 128/79 Pulse Rate: 89 SpO2: 99 %    HPI  Chief Complaint: OBESITY  Brent Mccoy is here to discuss his progress with his obesity treatment plan. Brent Mccoy is on the cat 4 meal plan and states Brent Mccoy is following his eating plan approximately 95 % of the time. Brent Mccoy states Brent Mccoy is exercising 30 minutes 3 times per week.   Interval History:  Since last office visit Brent Mccoy is down 13 lb Brent Mccoy is doing well on Cat 4 meal plan Brent Mccoy allows some low cal snacks at night Brent Mccoy has a good support system Brent Mccoy is trying to keep sweets to a minimum Brent Mccoy has chronic back pain, hip and R knee DJD pain. Brent Mccoy is using a peddler and free weights His net weight loss is 40 lb in the past 13 mos Brent Mccoy maintained his muscle mass  Pharmacotherapy: none  PHYSICAL EXAM:  Blood pressure 128/79, pulse 89, temperature (!) 97.5 F (36.4 C), height 6\' 3"  (1.905 m), weight (!) 406 lb (184.2 kg), SpO2 99 %. Body mass index is 50.75 kg/m.  General: Brent Mccoy is overweight, cooperative, alert, well developed, and in no acute distress. PSYCH: Has normal mood, affect and thought process.   Lungs: Normal breathing effort, no conversational dyspnea.   ASSESSMENT AND PLAN  TREATMENT PLAN FOR OBESITY:  Recommended Dietary Goals  Brent Mccoy is currently in the action stage of change. As such, his goal is to continue weight management plan. Brent Mccoy has agreed to the Category 4 Plan.  Behavioral Intervention  We discussed the following Behavioral Modification Strategies today: increasing lean protein intake, increasing vegetables, increasing water intake, work on meal planning and easy cooking plans, work on managing stress, creating time for self-care and relaxation measures, avoiding temptations and identifying enticing environmental cues, planning  for success, and keeping healthy foods at home.  Additional resources provided today: NA  Recommended Physical Activity Goals  Brent Mccoy has been advised to work up to 150 minutes of moderate intensity aerobic activity a week and strengthening exercises 2-3 times per week for cardiovascular health, weight loss maintenance and preservation of muscle mass.   Brent Mccoy has agreed to Think about ways to increase physical activity  Pharmacotherapy changes for the treatment of obesity: none On tramadol-- cannot take Contrave Hx of SVT- cannot take Phentermine or Qsymia GLP- 1 's no longer covered by insurance  ASSOCIATED CONDITIONS ADDRESSED TODAY  Pre-diabetes Assessment & Plan: Lab Results  Component Value Date   HGBA1C 5.7 (H) 06/11/2022   Doing well on prescribed meal plan, reducing intake of starches and sweets.  Has added in limited exercise.  Plan: continue prescribed meal plan.  Recheck A1c in 3 mos. Consider use of metformin.   Vitamin D deficiency Assessment & Plan: Last vitamin D Lab Results  Component Value Date   VD25OH 28.4 (L) 06/11/2022   Taking RX vitamin D 50,000 IU weekly.  Energy level is improving.  Denies GI side effects.  We discussed a goal vitamin D level of 50-70.  Continue/ RF RX vitamin D 50,000 IU weekly.  Recheck level in 3 mos.  Orders: -     Vitamin D (Ergocalciferol); Take 1 capsule (50,000 Units total) by mouth every 7 (seven) days.  Dispense: 5 capsule; Refill: 0  Morbid obesity (HCC)  Osteoarthritis of  both knees, unspecified osteoarthritis type Assessment & Plan: Bilat knee DJD remains a barrier to more exercise for him.  Brent Mccoy is using a cane to ambulate and takes Tramadol daily.  Hoping for reduction on pain with weight loss.  Will need BMI <40 for future TKR.  Plan: consider water exercise.  Brent Mccoy will check out silver sneakers.      Brent Mccoy was informed of the importance of frequent follow up visits to maximize his success with intensive lifestyle  modifications for his multiple health conditions.   ATTESTASTION STATEMENTS:  Reviewed by clinician on day of visit: allergies, medications, problem list, medical history, surgical history, family history, social history, and previous encounter notes pertinent to obesity diagnosis.   I have personally spent 30 minutes total time today in preparation, patient care, nutritional counseling and documentation for this visit, including the following: review of clinical lab tests; review of medical tests/procedures/services.      Dell Ponto, DO DABFM, DABOM [facility] [address] [place], [place] [address] [phone]

## 2022-07-15 NOTE — Assessment & Plan Note (Signed)
Bilat knee DJD remains a barrier to more exercise for him.  He is using a cane to ambulate and takes Tramadol daily.  Hoping for reduction on pain with weight loss.  Will need BMI <40 for future TKR.  Plan: consider water exercise.  He will check out silver sneakers.

## 2022-07-31 ENCOUNTER — Ambulatory Visit
Admission: RE | Admit: 2022-07-31 | Discharge: 2022-07-31 | Disposition: A | Discharge status: Home / Self Care | Payer: BC Managed Care – PPO | Source: Ambulatory Visit | Attending: Family Medicine | Admitting: Family Medicine

## 2022-07-31 ENCOUNTER — Other Ambulatory Visit: Payer: Self-pay | Admitting: Family Medicine

## 2022-07-31 DIAGNOSIS — M542 Cervicalgia: Secondary | ICD-10-CM

## 2022-08-06 ENCOUNTER — Ambulatory Visit (INDEPENDENT_AMBULATORY_CARE_PROVIDER_SITE_OTHER): Payer: BC Managed Care – PPO | Admitting: Family Medicine

## 2022-08-06 ENCOUNTER — Encounter (INDEPENDENT_AMBULATORY_CARE_PROVIDER_SITE_OTHER): Payer: Self-pay | Admitting: Family Medicine

## 2022-08-06 VITALS — BP 122/80 | HR 89 | Temp 98.2°F | Ht 75.0 in | Wt >= 6400 oz

## 2022-08-06 DIAGNOSIS — M5090 Cervical disc disorder, unspecified, unspecified cervical region: Secondary | ICD-10-CM | POA: Diagnosis not present

## 2022-08-06 DIAGNOSIS — E559 Vitamin D deficiency, unspecified: Secondary | ICD-10-CM | POA: Diagnosis not present

## 2022-08-06 DIAGNOSIS — Z6841 Body Mass Index (BMI) 40.0 and over, adult: Secondary | ICD-10-CM

## 2022-08-06 DIAGNOSIS — R7303 Prediabetes: Secondary | ICD-10-CM

## 2022-08-06 MED ORDER — VITAMIN D (ERGOCALCIFEROL) 1.25 MG (50000 UNIT) PO CAPS
50000.0000 [IU] | ORAL_CAPSULE | ORAL | 0 refills | Status: AC
Start: 2022-08-06 — End: ?

## 2022-08-06 NOTE — Assessment & Plan Note (Signed)
Last vitamin D Lab Results  Component Value Date   VD25OH 28.4 (L) 06/11/2022   He is doing well on ergocalciferol 50,000 IU once weekly.  His energy level is slowly improving.  Target vitamin D level 50-70.  He denies adverse side effects.  Continue ergocalciferol 50,000 IU once weekly.  Recheck level in the next 2 to 3 months.

## 2022-08-06 NOTE — Assessment & Plan Note (Addendum)
Reviewed from 07/31/22 showing severe cervical spine DDD Neck pain has contributed to increased food cravings, reduction of physical activity and worsening sleep at night This has been a barrier to his weight loss He is using tramadol and cyclobenzaprine as needed He has a follow-up visit scheduled with his PCP and plans to start physical therapy  Plan: Continue current plan of care.  He may use topical Biofreeze for muscular pain.  Avoid resistance training exercises that cause additional strain to his neck

## 2022-08-06 NOTE — Progress Notes (Signed)
Office: [phone]  /  Fax: [phone]  WEIGHT SUMMARY AND BIOMETRICS  Starting Date: 06/13/21  Starting Weight: 446lb   Weight Lost Since Last Visit: 0   Vitals Temp: 98.2 F (36.8 C) BP: 122/80 Pulse Rate: 89 SpO2: 99 %   Body Composition  Body Fat %: 45.5 % Fat Mass (lbs): 187 lbs Muscle Mass (lbs): 213 lbs Total Body Water (lbs): 171.4 lbs Visceral Fat Rating : 35    HPI  Chief Complaint: OBESITY  Brent Mccoy is here to discuss his progress with his obesity treatment plan. He is on the the Category 4 Plan and states he is following his eating plan approximately 70-75 % of the time. He states he is exercising 0 minutes 0 times per week.  Interval History:  Since last office visit he is up 4 lb This gives him a net weight loss of 36 lb in the past 13 mos of medically supervised weight management He has lost 1.8 lb of muscle and gained 6.4 lb of body fat in the past 3-4 weeks He has been dealing with cervical disc disease- he will be starting PT and takes Tramolol for his hip and knee pain already, takes Cyclobenzaprine on the nights that he doesn't take Ambien He is eating most of the foods on his plan but is snacking more on popcorn with oil He plans to get back to using a peddler and light weight   Pharmacotherapy: none  PHYSICAL EXAM:  Blood pressure 122/80, pulse 89, temperature 98.2 F (36.8 C), height  (1.905 m), weight (!) 410 lb (186 kg), SpO2 99 %. Body mass index is 51.25 kg/m.  General: He is overweight, cooperative, alert, well developed, and in no acute distress. PSYCH: Has normal mood, affect and thought process.   Lungs: Normal breathing effort, no conversational dyspnea.   ASSESSMENT AND PLAN  TREATMENT PLAN FOR OBESITY:  Recommended Dietary Goals  Brent Mccoy is currently in the action stage of change. As such, his goal is to continue weight management plan. He has agreed to the Category 4 Plan.  Behavioral Intervention  We  discussed the following Behavioral Modification Strategies today: increasing lean protein intake, increasing vegetables, increasing fiber rich foods, avoiding skipping meals, increasing water intake, work on meal planning and preparation, reading food labels , emotional eating strategies and understanding the difference between hunger signals and cravings, avoiding temptations and identifying enticing environmental cues, and planning for success.  Additional resources provided today: NA  Recommended Physical Activity Goals  Brent Mccoy has been advised to work up to 150 minutes of moderate intensity aerobic activity a week and strengthening exercises 2-3 times per week for cardiovascular health, weight loss maintenance and preservation of muscle mass.   He has agreed to Think about ways to increase physical activity  Pharmacotherapy changes for the treatment of obesity: None  ASSOCIATED CONDITIONS ADDRESSED TODAY  Cervical disc disease Assessment & Plan: Reviewed from 07/31/22 showing severe cervical spine DDD Neck pain has contributed to increased food cravings, reduction of physical activity and worsening sleep at night This has been a barrier to his weight loss He is using tramadol and cyclobenzaprine as needed He has a follow-up visit scheduled with his PCP and plans to start physical therapy  Plan: Continue current plan of care.  He may use topical Biofreeze for muscular pain.  Avoid resistance training exercises that cause additional strain to his neck      Vitamin D deficiency Assessment & Plan: Last vitamin D Lab Results  Component Value Date   VD25OH 28.4 (L) 06/11/2022   He is doing well on ergocalciferol 50,000 IU once weekly.  His energy level is slowly improving.  Target vitamin D level 50-70.  He denies adverse side effects.  Continue ergocalciferol 50,000 IU once weekly.  Recheck level in the next 2 to 3 months.  Orders: -     Vitamin D (Ergocalciferol); Take 1  capsule (50,000 Units total) by mouth every 7 (seven) days.  Dispense: 5 capsule; Refill: 0  Morbid obesity  BMI 50.0-59.9, adult  Pre-diabetes Assessment & Plan: Lab Results  Component Value Date   HGBA1C 5.7 (H) 06/11/2022   Last A1c in February was in the prediabetic range.  He is currently not on any medication for prediabetes.  He is actively working on dietary change and has limited physical activity due to polyarthralgias.  Plan: Continue to work on reducing intake of starches and sugar.  Recheck A1c in 3 to 4 months.       He was informed of the importance of frequent follow up visits to maximize his success with intensive lifestyle modifications for his multiple health conditions.   ATTESTASTION STATEMENTS:  Reviewed by clinician on day of visit: allergies, medications, problem list, medical history, surgical history, family history, social history, and previous encounter notes pertinent to obesity diagnosis.   I have personally spent 30 minutes total time today in preparation, patient care, nutritional counseling and documentation for this visit, including the following: review of clinical lab tests; review of medical tests/procedures/services.      Brent Brink, DO DABFM, DABOM [facility] [address] [place], [place] [address] [phone]

## 2022-08-06 NOTE — Assessment & Plan Note (Signed)
Lab Results  Component Value Date   HGBA1C 5.7 (H) 06/11/2022   Last A1c in February was in the prediabetic range.  He is currently not on any medication for prediabetes.  He is actively working on dietary change and has limited physical activity due to polyarthralgias.  Plan: Continue to work on reducing intake of starches and sugar.  Recheck A1c in 3 to 4 months.

## 2022-09-05 ENCOUNTER — Encounter (INDEPENDENT_AMBULATORY_CARE_PROVIDER_SITE_OTHER): Payer: Self-pay | Admitting: Family Medicine

## 2022-09-05 ENCOUNTER — Ambulatory Visit (INDEPENDENT_AMBULATORY_CARE_PROVIDER_SITE_OTHER): Payer: BC Managed Care – PPO | Admitting: Family Medicine

## 2022-09-05 VITALS — BP 113/74 | HR 86 | Temp 98.2°F | Ht 75.0 in | Wt >= 6400 oz

## 2022-09-05 DIAGNOSIS — R7303 Prediabetes: Secondary | ICD-10-CM

## 2022-09-05 DIAGNOSIS — M170 Bilateral primary osteoarthritis of knee: Secondary | ICD-10-CM | POA: Diagnosis not present

## 2022-09-05 DIAGNOSIS — E559 Vitamin D deficiency, unspecified: Secondary | ICD-10-CM

## 2022-09-05 DIAGNOSIS — Z6841 Body Mass Index (BMI) 40.0 and over, adult: Secondary | ICD-10-CM

## 2022-09-05 NOTE — Assessment & Plan Note (Signed)
Lab Results  Component Value Date   HGBA1C 5.7 (H) 06/11/2022   He is actively working on reducing intake of starches and added sugar Exercise is limited due to polyarthralgia He has lost 42 lb in the past 14 mos He has declined start of metformin though he has IR  Repeat A1c and fasting insulin in the next 3-4 mos

## 2022-09-05 NOTE — Assessment & Plan Note (Signed)
Reviewed his overall progress and his bioimpedence results He is doing better eating on his meal plan but on recall, he is eating 2 meals (one on plan) and a heavy snack late night which is not a high protein/ high fiber meal.  His overall lack of exercise has diminished his weight loss this past year.  He lacks coverage for GLP -1 receptor agonists  Resume cat 4 meal plan

## 2022-09-05 NOTE — Assessment & Plan Note (Signed)
Ambulating with cane and has limited exercise due to R>L knee DJD, hip pain, low back pain and neck pain He needs further BMI reduction to have R TKR done He has lost 42 lb in the past 14 mos  We discussed checking out options for water exercise He decline [facility] [employer] due to cost but will check out options with [employer] through the [employer]

## 2022-09-05 NOTE — Assessment & Plan Note (Signed)
Last vitamin D Lab Results  Component Value Date   VD25OH 28.4 (L) 06/11/2022   Doing well on RX vitamin D 50,000 IU weekly Energy level is improving Denies adverse SE  Recheck vitamin D level in 1-2 mos

## 2022-09-05 NOTE — Progress Notes (Signed)
Office: [phone]  /  Fax: [phone]  WEIGHT SUMMARY AND BIOMETRICS  Starting Date: 06/13/21  Starting Weight: 446 lb   Weight Lost Since Last Visit: 6 lb   Vitals Temp: 98.2 F (36.8 C) BP: 113/74 Pulse Rate: 86 SpO2: 97 %   Body Composition  Body Fat %: 44.7 % Fat Mass (lbs): 180.08 lbs Muscle Mass (lbs): 213 lbs Total Body Water (lbs): 169.6 lbs Visceral Fat Rating : 34     HPI  Chief Complaint: OBESITY  Brent Mccoy is here to discuss his progress with his obesity treatment plan. He is on the the Category 4 Plan and states he is following his eating plan approximately 85 % of the time. He states he is not exercising, but going to physical therapy 2 times weekly for 45 minutes.  Interval History:  Since last office visit he is down 6 lb He is back on his meal plan He is in PT for his neck pain.  He is doing some stretches with use of tylenol prn Plans to have R TKR in the future once his BMI is down.  He gets back pain if he peddles too much He eats a late breakfast, an early dinner and then a 3rd meal heavy snack later like a low sugar popscile and popcorn He tries to increase his intake of veggies with dinner and water intake His total weight loss is 42 lb in the past 14 mos His wife is supportive of his changes He has been eating pineapple  Pharmacotherapy: none  PHYSICAL EXAM:  Blood pressure 113/74, pulse 86, temperature 98.2 F (36.8 C), height 6\' 3"  (1.905 m), weight (!) 404 lb (183.3 kg), SpO2 97 %. Body mass index is 50.5 kg/m.  General: He is overweight, cooperative, alert, well developed, and in no acute distress. PSYCH: Has normal mood, affect and thought process.   Lungs: Normal breathing effort, no conversational dyspnea.   ASSESSMENT AND PLAN  TREATMENT PLAN FOR OBESITY:  Recommended Dietary Goals  Brent Mccoy is currently in the action stage of change. As such, his goal is to continue weight management plan. He has agreed to the  Category 4 Plan.  Behavioral Intervention  We discussed the following Behavioral Modification Strategies today: increasing lean protein intake, decreasing simple carbohydrates , increasing vegetables, increasing lower glycemic fruits, increasing fiber rich foods, increasing water intake, keeping healthy foods at home, work on managing stress, creating time for self-care and relaxation measures, continue to practice mindfulness when eating, and planning for success.  Additional resources provided today: NA  Recommended Physical Activity Goals  Brent Mccoy has been advised to work up to 150 minutes of moderate intensity aerobic activity a week and strengthening exercises 2-3 times per week for cardiovascular health, weight loss maintenance and preservation of muscle mass.   He has agreed to Start aerobic activity with a goal of 150 minutes a week at moderate intensity.   Check out [employer] for water exercise options  Pharmacotherapy changes for the treatment of obesity: none  ASSOCIATED CONDITIONS ADDRESSED TODAY  Pre-diabetes Assessment & Plan: Lab Results  Component Value Date   HGBA1C 5.7 (H) 06/11/2022   He is actively working on reducing intake of starches and added sugar Exercise is limited due to polyarthralgia He has lost 42 lb in the past 14 mos He has declined start of metformin though he has IR  Repeat A1c and fasting insulin in the next 3-4 mos    Morbid obesity (HCC) with starting  BMI  55 Assessment & Plan: Reviewed his overall progress and his bioimpedence results He is doing better eating on his meal plan but on recall, he is eating 2 meals (one on plan) and a heavy snack late night which is not a high protein/ high fiber meal.  His overall lack of exercise has diminished his weight loss this past year.  He lacks coverage for GLP -1 receptor agonists  Resume cat 4 meal plan   BMI 50.0-59.9, adult (HCC)  Vitamin D deficiency Assessment & Plan: Last  vitamin D Lab Results  Component Value Date   VD25OH 28.4 (L) 06/11/2022   Doing well on RX vitamin D 50,000 IU weekly Energy level is improving Denies adverse SE  Recheck vitamin D level in 1-2 mos   Osteoarthritis of both knees, unspecified osteoarthritis type Assessment & Plan: Ambulating with cane and has limited exercise due to R>L knee DJD, hip pain, low back pain and neck pain He needs further BMI reduction to have R TKR done He has lost 42 lb in the past 14 mos  We discussed checking out options for water exercise He decline [facility] [employer] due to cost but will [facility] [employer] [facility]       He was informed of the importance of frequent follow up visits to maximize his success with intensive lifestyle modifications for his multiple health conditions.   ATTESTASTION STATEMENTS:  Reviewed by clinician on day of visit: allergies, medications, problem list, medical history, surgical history, family history, social history, and previous encounter notes pertinent to obesity diagnosis.   I have personally spent 30 minutes total time today in preparation, patient care, nutritional counseling and documentation for this visit, including the following: review of clinical lab tests; review of medical tests/procedures/services.      Glennis Brink, DO DABFM, DABOM [facility] [address] [place], [place] [address] [phone]

## 2022-10-14 ENCOUNTER — Ambulatory Visit (INDEPENDENT_AMBULATORY_CARE_PROVIDER_SITE_OTHER): Payer: BC Managed Care – PPO | Admitting: Family Medicine

## 2022-10-29 ENCOUNTER — Other Ambulatory Visit: Payer: Self-pay | Admitting: Physician Assistant

## 2022-10-29 DIAGNOSIS — M545 Low back pain, unspecified: Secondary | ICD-10-CM

## 2022-10-29 DIAGNOSIS — M5416 Radiculopathy, lumbar region: Secondary | ICD-10-CM

## 2022-10-29 DIAGNOSIS — M47892 Other spondylosis, cervical region: Secondary | ICD-10-CM

## 2022-10-29 DIAGNOSIS — G959 Disease of spinal cord, unspecified: Secondary | ICD-10-CM

## 2022-11-05 ENCOUNTER — Ambulatory Visit (INDEPENDENT_AMBULATORY_CARE_PROVIDER_SITE_OTHER): Payer: BC Managed Care – PPO | Admitting: Family Medicine

## 2022-11-17 ENCOUNTER — Ambulatory Visit
Admission: RE | Admit: 2022-11-17 | Discharge: 2022-11-17 | Disposition: A | Discharge status: Home / Self Care | Payer: BC Managed Care – PPO | Source: Ambulatory Visit | Attending: Physician Assistant | Admitting: Physician Assistant

## 2022-11-17 DIAGNOSIS — M5416 Radiculopathy, lumbar region: Secondary | ICD-10-CM

## 2022-11-17 DIAGNOSIS — G959 Disease of spinal cord, unspecified: Secondary | ICD-10-CM

## 2022-11-17 DIAGNOSIS — M545 Low back pain, unspecified: Secondary | ICD-10-CM

## 2022-11-17 DIAGNOSIS — M47892 Other spondylosis, cervical region: Secondary | ICD-10-CM

## 2022-12-06 ENCOUNTER — Other Ambulatory Visit (INDEPENDENT_AMBULATORY_CARE_PROVIDER_SITE_OTHER): Payer: Self-pay | Admitting: Family Medicine

## 2022-12-06 DIAGNOSIS — E559 Vitamin D deficiency, unspecified: Secondary | ICD-10-CM

## 2023-02-28 NOTE — Progress Notes (Signed)
HPI M never smoker followed for Sarcoid, Insomnia, OSA/CPAP,  complicated by HBP, allergic rhinitis, GERD, LeftDVT 2019, PE 2020/ Eliquis, Obesity, CKD3a,  NPSG [facility] 05/04/14 AHI 61.2/ hr, desaturation to 81%, CPAP to 11, body weight 422 lbs ACE 04/23/16- 68 ( 9-67) Lab- ACE level  53 on 10/18/21, 71H on 04/25/21, 85H on 03/29/21, 68 H on 04/23/16 CBC with differential 04/23/2016-WNL PFT 03/29/22- possible restriction, Lung volumes not directly measured. --------------------------------------------------------------------------------------------   04/29/22- 62 year old M never smoker followed for Sarcoid, Insomnia, OSA/CPAP,  complicated  by HBP, allergic rhinitis, GERD, Left DVT 2019, PE 2020/ Eliquis, Obesity, CKD3a, SVT, COVID infection May 2022, -Ambien 10, Ventolin hfa, CPAP auto12-16 /[facility]    Brent Mccoy Download compliance -  Body weight today-423 lbs Covid vax-5 Phizer Flu vax-had  ACE 53 on 10/18/21 PFT 03/29/22- possible restriction, Lung volumes not directly measured. Using a [facility] CPAP machine.  He has no SD card and no download capability.  He reports using routinely and sleeping better with CPAP. Dyspnea on exertion especially bending over.  He has not been able to get his weight down.  He talks about getting back to swimming. He asks about RSV vaccine.  03/03/23- 62 year old M never smoker followed for Sarcoid, Insomnia, OSA/CPAP,  complicated  by HBP, allergic rhinitis, GERD, Left DVT 2019, PE 2020/ Eliquis, Morbid Obesity, CKD3a, SVT, COVID infection May 2022, -Ambien 10, Ventolin hfa, CPAP auto12-16 /[facility]    Brent Mccoy Download compliance - Not available Body weight today-436 lbs He reports unable to sleep w/o CPAP, uses every night including travel. Tramadol for spine pain.  Notes dyspnea esp bending over. Little cough or wheee.  ROV-see HPI    + = positive Constitutional:   No-   weight loss, night sweats, fevers, chills, +fatigue, lassitude. HEENT:   No-   headaches, difficulty swallowing, tooth/dental problems, sore throat,       No-  sneezing, itching, ear ache, nasal congestion, post nasal drip,  CV:  No-   chest pain, orthopnea, PND, swelling in lower extremities, anasarca, dizziness, palpitations Resp: + shortness of breath with exertion or at rest.              No-   productive cough,   non-productive cough,  No- coughing up of blood.              No-   change in color of mucus.  No- wheezing.   Skin: No-   rash or lesions. GI:  No-   heartburn, indigestion, abdominal pain, nausea, vomiting, diarrhea,                 change in bowel habits, loss of appetite GU: No-   dysuria, change in color of urine, no urgency or frequency.  No- flank pain. MS:  + joint pain or swelling.  No- decreased range of motion.  No- back pain. Neuro-     nothing unusual Psych:  No- change in mood or affect. No depression or anxiety. + memory loss.  OBJ General- Alert, Oriented, Affect-appropriate, Distress- none acute, +morbidly obese/ big, laconic Skin-  l+few hyperpigmented scars Lymphadenopathy- none Head- atraumatic            Eyes- Gross vision intact, PERRLA, conjunctivae clear secretions            Ears- Hearing, canals-normal            Nose- Clear, no-Septal dev, mucus, polyps, erosion, perforation  Throat- Mallampati II-III , mucosa clear , drainage- none, tonsils- atrophic Neck- flexible , trachea midline, no stridor , thyroid nl, carotid no bruit Chest - symmetrical excursion , unlabored           Heart/CV- RRR , no murmur , no gallop  , no rub, nl s1 s2                           - JVD- none , edema- none, stasis changes- none, varices- none           Lung- clear to P&A, wheeze- none, cough- none , dullness-none, rub- none           Chest wall-  Abd-  Br/ Gen/ Rectal- Not done, not indicated Extrem- cyanosis- none, clubbing, none, atrophy- none, strength- nl,               + cane Neuro- grossly intact to observation

## 2023-03-03 ENCOUNTER — Encounter: Payer: Self-pay | Admitting: Internal Medicine

## 2023-03-03 ENCOUNTER — Ambulatory Visit (INDEPENDENT_AMBULATORY_CARE_PROVIDER_SITE_OTHER): Payer: BC Managed Care – PPO | Admitting: Internal Medicine

## 2023-03-03 VITALS — BP 130/83 | HR 65 | Temp 98.3°F | Ht 75.0 in | Wt >= 6400 oz

## 2023-03-03 DIAGNOSIS — G4733 Obstructive sleep apnea (adult) (pediatric): Secondary | ICD-10-CM

## 2023-03-03 DIAGNOSIS — E559 Vitamin D deficiency, unspecified: Secondary | ICD-10-CM

## 2023-03-03 DIAGNOSIS — D869 Sarcoidosis, unspecified: Secondary | ICD-10-CM

## 2023-03-03 NOTE — Patient Instructions (Signed)
Order- DME [facility]- please provide download capability for [facility] CPAP, continue auto 12-16, mask of choice, humidifier, supplies  Order- lab- Angiotensin Converting Enzyme level, Vitamin D level   Dx Sarcoid, Vitamin deficiency

## 2023-03-07 LAB — ANGIOTENSIN CONVERTING ENZYME: Angiotensin-Converting Enzyme: 52 U/L (ref 9–67)

## 2023-03-07 LAB — VITAMIN D 1,25 DIHYDROXY
Vitamin D 1, 25 (OH)2 Total: 45 pg/mL (ref 18–72)
Vitamin D2 1, 25 (OH)2: 33 pg/mL
Vitamin D3 1, 25 (OH)2: 12 pg/mL

## 2023-04-01 NOTE — Assessment & Plan Note (Signed)
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

## 2023-04-01 NOTE — Assessment & Plan Note (Signed)
Needs external help to get control of this.

## 2023-04-01 NOTE — Assessment & Plan Note (Signed)
He asks recheck while here. Managed by PCP

## 2023-04-01 NOTE — Assessment & Plan Note (Signed)
Benefits from and depends on cpap Plan- continue auto 12-16. DME [facility] to provide download capability for his [facility] machine

## 2023-05-05 ENCOUNTER — Telehealth: Payer: Self-pay | Admitting: Internal Medicine

## 2023-05-05 NOTE — Telephone Encounter (Signed)
 [place] CALLED OUR ANSWERING SERVICE AND THEY STATE THE FOLLOWING: 'MEDICAL REQ FOLLOW UP'

## 2023-05-08 NOTE — Telephone Encounter (Signed)
NFN 

## 2023-11-21 NOTE — Procedures (Signed)
Mask fit

## 2024-03-02 ENCOUNTER — Ambulatory Visit: Payer: BC Managed Care – PPO | Admitting: Internal Medicine

## 2024-03-11 ENCOUNTER — Ambulatory Visit: Admitting: Internal Medicine

## 2024-04-30 NOTE — Telephone Encounter (Signed)
 A user error has taken place: encounter opened in error, closed for administrative reasons.
